# Patient Record
Sex: Male | Born: 1955 | Race: White | Hispanic: No | Marital: Single | Smoking: Former smoker
Health system: Southern US, Community
[De-identification: ages and names within clinical notes are randomized; demographics above are authoritative.]

## PROBLEM LIST (undated history)

## (undated) DIAGNOSIS — F209 Schizophrenia, unspecified: Secondary | ICD-10-CM

## (undated) DIAGNOSIS — E785 Hyperlipidemia, unspecified: Secondary | ICD-10-CM

## (undated) DIAGNOSIS — K219 Gastro-esophageal reflux disease without esophagitis: Secondary | ICD-10-CM

## (undated) DIAGNOSIS — E119 Type 2 diabetes mellitus without complications: Secondary | ICD-10-CM

## (undated) DIAGNOSIS — C801 Malignant (primary) neoplasm, unspecified: Secondary | ICD-10-CM

## (undated) DIAGNOSIS — I10 Essential (primary) hypertension: Secondary | ICD-10-CM

## (undated) HISTORY — PX: KNEE ARTHROSCOPY: SUR90

---

## 2001-12-01 ENCOUNTER — Encounter: Payer: Self-pay | Admitting: Emergency Medicine

## 2001-12-02 ENCOUNTER — Encounter: Payer: Self-pay | Admitting: Orthopedic Surgery

## 2001-12-02 ENCOUNTER — Inpatient Hospital Stay (HOSPITAL_COMMUNITY): Admission: EM | Admit: 2001-12-02 | Discharge: 2001-12-03 | Payer: Self-pay | Admitting: *Deleted

## 2001-12-02 ENCOUNTER — Encounter: Payer: Self-pay | Admitting: Emergency Medicine

## 2006-12-17 ENCOUNTER — Emergency Department (HOSPITAL_COMMUNITY): Admission: EM | Admit: 2006-12-17 | Discharge: 2006-12-17 | Payer: Self-pay | Admitting: Family Medicine

## 2007-01-30 ENCOUNTER — Emergency Department (HOSPITAL_COMMUNITY): Admission: EM | Admit: 2007-01-30 | Discharge: 2007-01-30 | Payer: Self-pay | Admitting: Family Medicine

## 2007-02-01 ENCOUNTER — Emergency Department (HOSPITAL_COMMUNITY): Admission: EM | Admit: 2007-02-01 | Discharge: 2007-02-01 | Payer: Self-pay | Admitting: Family Medicine

## 2007-03-14 ENCOUNTER — Emergency Department (HOSPITAL_COMMUNITY): Admission: EM | Admit: 2007-03-14 | Discharge: 2007-03-14 | Payer: Self-pay | Admitting: Emergency Medicine

## 2007-08-28 ENCOUNTER — Emergency Department (HOSPITAL_COMMUNITY): Admission: EM | Admit: 2007-08-28 | Discharge: 2007-08-28 | Payer: Self-pay | Admitting: Family Medicine

## 2008-12-24 ENCOUNTER — Inpatient Hospital Stay (HOSPITAL_COMMUNITY): Admission: EM | Admit: 2008-12-24 | Discharge: 2008-12-26 | Payer: Self-pay | Admitting: Emergency Medicine

## 2008-12-24 ENCOUNTER — Ambulatory Visit: Payer: Self-pay | Admitting: Internal Medicine

## 2008-12-28 ENCOUNTER — Ambulatory Visit: Payer: Self-pay

## 2011-02-13 LAB — CBC
HCT: 45.9 % (ref 39.0–52.0)
Hemoglobin: 15.9 g/dL (ref 13.0–17.0)
MCHC: 34.6 g/dL (ref 30.0–36.0)
MCV: 93.3 fL (ref 78.0–100.0)
Platelets: 172 10*3/uL (ref 150–400)
RBC: 4.92 MIL/uL (ref 4.22–5.81)
RDW: 13.3 % (ref 11.5–15.5)
WBC: 6.2 10*3/uL (ref 4.0–10.5)

## 2011-02-13 LAB — HEMOGLOBIN A1C: Hgb A1c MFr Bld: 6.4 % — ABNORMAL HIGH (ref 4.6–6.1)

## 2011-02-13 LAB — BASIC METABOLIC PANEL
BUN: 9 mg/dL (ref 6–23)
CO2: 25 mEq/L (ref 19–32)
Calcium: 9.5 mg/dL (ref 8.4–10.5)
Chloride: 108 mEq/L (ref 96–112)
Creatinine, Ser: 0.88 mg/dL (ref 0.4–1.5)
GFR calc Af Amer: >60 mL/min (ref >60)
GFR calc non Af Amer: >60 mL/min (ref >60)
Glucose, Bld: 139 mg/dL — ABNORMAL HIGH (ref 70–99)
Potassium: 4.4 mEq/L (ref 3.5–5.1)
Sodium: 139 mEq/L (ref 135–145)

## 2011-02-13 LAB — PROTIME-INR
INR: 1 (ref 0.00–1.49)
Prothrombin Time: 13.7 seconds (ref 11.6–15.2)

## 2011-02-13 LAB — DIFFERENTIAL
Basophils Absolute: 0 10*3/uL (ref 0.0–0.1)
Basophils Relative: 1 % (ref 0–1)
Eosinophils Absolute: 0.1 10*3/uL (ref 0.0–0.7)
Eosinophils Relative: 1 % (ref 0–5)
Lymphocytes Relative: 31 % (ref 12–46)
Lymphs Abs: 1.9 10*3/uL (ref 0.7–4.0)
Monocytes Absolute: 0.6 K/uL (ref 0.1–1.0)
Monocytes Relative: 9 % (ref 3–12)
Neutro Abs: 3.6 K/uL (ref 1.7–7.7)
Neutrophils Relative %: 58 % (ref 43–77)

## 2011-02-13 LAB — COMPREHENSIVE METABOLIC PANEL
ALT: 15 U/L (ref 0–53)
AST: 19 U/L (ref 0–37)
Albumin: 3.8 g/dL (ref 3.5–5.2)
Alkaline Phosphatase: 74 U/L (ref 39–117)
Calcium: 9.3 mg/dL (ref 8.4–10.5)
GFR calc Af Amer: >60 mL/min (ref >60)
Potassium: 4.5 mEq/L (ref 3.5–5.1)
Sodium: 140 mEq/L (ref 135–145)
Total Protein: 6.1 g/dL (ref 6.0–8.3)

## 2011-02-13 LAB — CARDIAC PANEL(CRET KIN+CKTOT+MB+TROPI)
Relative Index: INVALID (ref 0.0–2.5)
Relative Index: INVALID (ref 0.0–2.5)
Total CK: 42 U/L (ref 7–232)
Total CK: 45 U/L (ref 7–232)
Total CK: 59 U/L (ref 7–232)
Troponin I: <0.01 ng/mL (ref 0.00–0.06)

## 2011-02-13 LAB — POCT CARDIAC MARKERS
Myoglobin, poc: 63.7 ng/mL (ref 12–200)
Myoglobin, poc: 76.6 ng/mL (ref 12–200)
Troponin i, poc: <0.05 ng/mL (ref 0.00–0.09)

## 2011-02-13 LAB — APTT: aPTT: 28 s (ref 24–37)

## 2011-02-13 LAB — GLUCOSE, CAPILLARY
Glucose-Capillary: 116 mg/dL — ABNORMAL HIGH (ref 70–99)
Glucose-Capillary: 138 mg/dL — ABNORMAL HIGH (ref 70–99)

## 2011-02-13 LAB — LIPID PANEL
Cholesterol: 148 mg/dL (ref 0–200)
HDL: 27 mg/dL — ABNORMAL LOW (ref >39)
LDL Cholesterol: 93 mg/dL (ref 0–99)
Total CHOL/HDL Ratio: 5.5 RATIO
Triglycerides: 138 mg/dL (ref <150)
VLDL: 28 mg/dL (ref 0–40)

## 2011-02-13 LAB — RAPID URINE DRUG SCREEN, HOSP PERFORMED
Barbiturates: NOT DETECTED
Opiates: NOT DETECTED

## 2011-03-13 NOTE — H&P (Signed)
Chad Wang, Chad Wang             ACCOUNT NO.:  [id number]   MEDICAL RECORD NO.:  [id number]          PATIENT TYPE:  EMS   LOCATION:  MAJO                         FACILITY:  [facility]   PHYSICIAN:  Eduard Clos, MDDATE OF BIRTH:  1956/03/01   DATE OF ADMISSION:  12/24/2008  DATE OF DISCHARGE:                              HISTORY & PHYSICAL   HISTORY OF PRESENT ILLNESS:  Chad Wang is a 55 year old male with  past medical history of hypertension not on medications, questionable  history of diabetes, dyslipidemia and schizophrenia; who came to  emergency department complaining of chest pain and dyspnea on exertion  for the last week prior to being admitted.  The patient reports  especially when he is exercising, he develops chest tightness and  discomfort for 5-7/10 in intensity, localized in the middle of his chest  lasting about 5-10 minutes on and off associated with shortness of  breath and relieved by rest.  The patient endorses having those symptoms  at rest as well, but less frequent.  He reports that one week ago his  psychiatry medications for schizophrenia were changed decreasing  clonazepam dose and adding haloperidol at bedtime.  The patient denies  diaphoresis, abdominal pain, nausea, vomiting, fever, lightheadedness or  syncopal symptoms.   ALLERGIES:  No known drug allergies.   PAST MEDICAL HISTORY:  1. Schizophrenia.  2. Hypertension.  3. Dyslipidemia.  4. Questionable diabetes.   MEDICATIONS:  1. Tylenol 650 mg at bedtime.  2. Ibuprofen 800 mg p.r.n.  3. Clozaril 200 mg at bedtime.  4. Haldol 10 mg at bedtime.  5. Gemfibrozil 600 mg b.i.d.   REVIEW OF SYSTEMS:  Completely negative 12 point inspection.  The  patient even denies chest pain at the moment of interview.   SOCIAL HISTORY:  The patient denies use of illicit drugs.  He is a  current smoker one pack per day and reports occasional alcohol.  The  patient is vegetarian, is retired.   PRIMARY  CARE PHYSICIAN:  Dr. Nathanial Rancher in [place].   PHYSICAL EXAM:  Temperature 97.4, heart rate 92, blood pressure 145/84,  oxygen saturation 95% on room air, respiratory rate 20.  IN GENERAL:  The patient was in no acute distress, lying on bed.  CARDIOVASCULAR:  Mild tachycardia.  No murmurs, gallops or rubs.  PULMONARY:  Good air movement with scattered rhonchi.  No wheezing, no  crackles.  ABDOMEN:  Soft, nontender, nondistended.  Positive bowel sounds.  There  was no guarding or masses.  EXTREMITIES:  Without edema, cyanosis or clubbing.  NEUROLOGIC:  Cranial nerves II-XII intact.  Muscle strength 5/5  bilaterally.  No focal deficits.  The patient was alert, awake and  oriented x3.   LABORATORIES:  We have white blood cells of 6.2, hemoglobin 15.9,  platelets 172.  We have sodium 139, potassium 4.4, chloride 108, bicarb  25, BUN 9, creatinine 0.88, blood sugar 139.  Cardiac markers point of  care:  CK-MB 1.2, troponin less than 0.05.  Myoglobin 76.6.  The patient  had a chest x-ray that demonstrated stable chronic bronchitis changes  without infiltrates or  active disease.  EKG:  Left ventricular  hypertrophy, left atrial enlargement, T-wave inversions.  On anterior  and lateral views, no ST-segment depression or elevation.   ASSESSMENT AND PLAN:  1. Chest pain.  Multiple risk factors including diabetes, hypertension      and dyslipidemia, smoking and age.  The patient is going to be      admitted in order to rule out acute coronary syndrome.  We are      going to perform serial electrocardiograms, cardiac markers x3      every 8 hours.  We are going to check TSH, fasting lipid profile.      We are going to start the patient on beta blockers after ruling out      cocaine use by urine drug screen.  We are going to give morphine,      nitroglycerin p.r.n., aspirin and oxygen as needed.  The patient is      going to be admitted to telemetry under [facility] team D and      depending on  results of his laboratories and EKG, we are going to      consult Cardiology for inpatient versus outpatient stress test.  We      are going to use also Protonix and Lovenox for prophylaxis.  We are      going to check CMET and complete blood count, and we are going to      start the patient on statins.   1. Hypertension.  The patient is going to receive beta blockers and we      are going to start also hydrochlorothiazide.  We are going to check      renal function and electrolytes, and at the moment of discharge we      are going to decide if ACE inhibitors are needed for better      control, especially if patient is a diabetic.   1. Hyperlipidemia.  The patient is going to be started on statins and      we are going to check a lipid profile.   1. Tobacco abuse.  We are going to provide counseling and we are going      to use Nicotine patch at this moment.   1. Schizophrenia.  We are going to continue treatment with clonazepam      since the symptoms started after the addition of the Haldol, which      could produce arrhythmia and also hypertension.  We are going to      hold it for now.  The patient is stable regarding this problem.  He      denies hallucinations and suicide ideation at this time.  We are      going to consult Psychiatry if needed.   1. Questionable diabetes.  We are going to check a fasting blood sugar      and also A1c and depending on results, we are going to start      treatment if needed.   For prophylaxis we are going to use Protonix and Lovenox.      Rosanna Randy, MD  Electronically Signed      Eduard Clos, MD  Electronically Signed    CEM/MEDQ  D:  12/24/2008  T:  12/24/2008  Job:  [id number]

## 2011-03-13 NOTE — Consult Note (Signed)
Chad Wang, Chad Wang             ACCOUNT NO.:  [id number]   MEDICAL RECORD NO.:  [id number]          PATIENT TYPE:  INP   LOCATION:  4729                         FACILITY:  [facility]   PHYSICIAN:  Duke Salvia, MD, FACCDATE OF BIRTH:  1955-12-27   DATE OF CONSULTATION:  12/25/2008  DATE OF DISCHARGE:                                 CONSULTATION   We are asked to see Mr. Chahal at the request of the [facility]  because of chest pain.   Mr. Judice is a 55 year old generally with a past medical history  of untreated hypertension, dyslipidemia, family history of heart  disease, and treated schizophrenia, who comes to the emergency room with  complaints of chest pain and shortness of breath.  The history listed  below with a history I got it is different from the chart's history and  it is different from the patient's initial symptoms.   He describes last Thursday while running an uncomfortable sensation in  his midchest.  It made hard for him to catch his breath.  He describes  it as lasting just a couple of minutes.   In distinction, the admission note describes chest  discomfort occurring  multiple times during the preceding week while jogging, lasting 5-10  minutes and then the initial note also describes him as denying chest  pain.  Denies having had chest pain at all.  Having said that when I  first asked him about chest pain, he says it was not early pain, it was  pressure and it went into his left shoulder and into the jaw and then he  talks about this vacuous sensation in his midchest.   His cardiac risk factors are as noted previously.  He does not have a  history of reflux or ulcer disease, but he does take NSAIDS.   His past medical history is as noted previously.   His past surgical history is notable for knee surgery and ankle surgery.   His review of systems is broadly negative except as otherwise noted  above across multiple-organ systems.   He has  no known drug allergies.   His medications at home included Haldol, Klonopin, ibuprofen, and  gemfibrozil.   SOCIAL HISTORY:  He describes his diet has been vegetarian for 8-10  years.  He smokes a pack per day.  He denies use of illicit drugs.  He  drinks alcohol rarely and he lives in a home.   PHYSICAL EXAMINATION:  GENERAL:  He is a middle-aged Caucasian male,  appearing somewhat older than his stated age of 55.  VITAL SIGNS:  His blood pressure is 128/87.  His pulse is 89.  His  respirations are 20.  He is afebrile.  HEENT:  No icterus.  No xanthomata.  Although, he is wearing sunglasses  and a ski cap in the room.  NECK:  His neck veins are flat.  His carotids are brisk and full  bilaterally without bruits.  BACK:  Without kyphosis or scoliosis.  LUNGS:  Clear.  HEART:  Heart sounds are regular without murmurs or gallops.  ABDOMEN:  Soft with  active bowel sounds.  EXTREMITIES:  His femoral pulses are not examined.  His distal pulses  are intact.  There is no clubbing, cyanosis, or edema.  He is wearing a  knee brace on the left knee and a wrist brace on the left wrist.  He did  take off his glasses halfway through our examination.   His cardiac enzymes are negative.  His hemogram and basic chemistries  were normal.   His electrocardiogram dated today which is only when that we have  available demonstrates sinus rhythm at 90 with interval of  0.13/0.09/0.39.  There is evidence of left ventricular hypertrophy by  voltage criteria with ST-T wave changes in the anterior and lateral  precordium as well as T-wave flattening in the inferior leads.   IMPRESSION:  1. Chest pain with typical and atypical features.  2. Cardiac risk factors notable for:      a.     Hypertension.      b.     Dyslipidemia.      c.     Elevated hemoglobin A1c.      d.     Family history.  3. Schizophrenia.  4. Cigarette use.  5. Abnormal electrocardiogram consistent with left ventricular       hypertrophy and repolarization abnormalities.   DISCUSSION:  Mr. Vanderwerf has a modest pretest probability of  coronary artery disease based on history.  This is best as I can glean.  The absence of objective markers, i.e., enzymes or ischemic  electrocardiographic changes and giving his schizophrenia, I would be  inclined to pursue a noninvasive workup initially as it does not require  any systemic sedation.  Catheterization would be reserved in the event  of an abnormal scan.   I reviewed this with him and as best I can tell he understands and  agrees.   RECOMMENDATIONS:  Based on the above, we will therefore proceed with:  1. Nuclear stress imaging which can be done as an outpatient and the      patient can potentially be discharged tomorrow.  2. Beta-blockers with hydrochlorothiazide seemed reasonable choices      for his blood pressure at this point.  3. Cigarette cessation counseling.  4. Secondary risk factor modification for his hyperglycemia and      elevated hemoglobin A1c.   Thank you for the consultation.      Duke Salvia, MD, [facility]  Electronically Signed     SCK/MEDQ  D:  12/25/2008  T:  12/26/2008  Job:  [id number]   cc:   Burnell Blanks, MD

## 2011-03-13 NOTE — Discharge Summary (Signed)
Wang, Chad             ACCOUNT NO.:  [id number]   MEDICAL RECORD NO.:  [id number]          PATIENT TYPE:  INP   LOCATION:  4729                         [facility]ACILITY[facility]  [facility]   PHYSICIAN:  Charlestine Massed, MDDATE OF BIRTH:  03/24/1956   DATE OF ADMISSION:  12/24/2008  DATE OF DISCHARGE:  12/26/2008                               DISCHARGE SUMMARY   PRIMARY CARE DOCTOR:  Burnell Blanks, MD   The patient was a resident of [facility] in [place].   REASON FOR ADMISSION:  Chest pain and dyspnea on exertion for 1 week.   DISCHARGE DIAGNOSES:  1. Atypical chest pain, acute myocardial infarction ruled out.  2. Hypertension.  3. Dyslipidemia.  4. Family history of heart disease.  5. Schizophrenia.  6. Tobacco abuse.  7. Questionable diabetes.   DISCHARGE MEDICATIONS:  1. Aspirin 81 mg daily (new medication).  2. Lopressor 12.5 mg b.i.d.  3. Hydrochlorothiazide 25 mg daily.  4. Pravachol 40 mg p.o. daily.  5. Nicotine patch 21 mg per day for 7 days, then 40 mg per day for 7      days and 7 mg per day for 7 days and then stop.   Existing medications to be continued:  1. Gemfibrozil 610 mg b.i.d.  2. Clozaril 200 mg q.h.s.  3. Haldol 10 mg p.o. q.h.s.   HOSPITAL COURSE BY PROBLEM:  1. Chest pain.  Mr. Tanay Misuraca is a 55 year old Caucasian male      who lives at this group home because of his history of      schizophrenia, brought in with complaints of chest pain.  He states      that he usually jogs a lot and for the past 1 week whenever he is      starting jogging, after a particular distance, he gets a pressure-      like feeling in his chest.  It does not radiate and at the same      time, he also gets slightly short of breath.  He will stop and then      the pain lasts a few minutes and goes away.  He never had any loss      of consciousness.  He did not fall.  There was no nausea, vomiting      or diarrhea.  The patient was admitted with the diagnosis  of      atypical chest pain to rule out myocardial infarction.  EKG did not      show any acute changes did not show any acute changes other than      the existing change of left ventricular hypertrophy with LV strain      pattern secondary to LVH.  His serum cardiac markers were negative      for an acute MI.  The patient has been pain free for 24 after      admission the hospital.  The patient was seen by cardiology,      [facility], by Dr. Graciela Husbands.  He has opined that the patient      has atypical chest pain and  so will need a stress test with nuclear      imaging which the patient is supposed to get as an outpatient.  The      patient was observed overnight.  Telemetry did not have any issues      and he has been chest pain-free.  His outpatient stress test as he      has been set up by [facility] for follow-up as outpatient on Monday      for stress testing, so the patient is being discharged.  2. Hypertension.  The patient had been started on hydrochlorothiazide      and low dose Lopressor.  He has been advised to continue the      medications and to adhere to medications.  3. Hypercholesterolemia.  The patient was on gemfibrozil 600 mg b.i.d.      before meals.  We have added Pravachol 40 mg p.o. daily in view of      his existing medications of hydrochlorothiazide and Clozaril.  The      patient is to continue the medication and low sodium heart healthy      diet.  4. Questionable diabetes.  His hemoglobin A1c is slightly elevated at      6.4.  As per the guidelines, that is not a diagnostic criteria for      diabetes by any means.  The patient needs to be worked up with 2      sets of fasting blood sugars while at the group home where his      primary care doctor can follow him up and if he is found diabetic,      can be initiated on medications.  A single reading of 6.4 of      hemoglobin A1c in no way is diagnostic factor for diabetes mellitus      so I am not starting any  medications for diabetes at this point.  5. Tobacco dependence.  The patient has agree to continue on nicotine      patch and stop his smoking.  He has been educated clearly about the      risks of smoking as he has chest pain, has hypercholesterolemia      with hypertension and a strong family history of coronary artery      disease.  The patient agrees and he has been prescribed nicotine      patch at discharge to continue.  The patient is being discharged      today.   DISCHARGE INSTRUCTIONS:  1. Follow up with [facility] for outpatient stress testing.  2. Adhere to medications.  3. Cessation of smoking.   FOLLOW UP:  1. follow up with [facility] for stress testing as an outpatient at the      mentioned date  2. Primary care physician to follow-up the patient's blood sugars at      nursing home, 2 sets of fasting blood sugars to diagnose if the      patient has diabetes are not.      Charlestine Massed, MD  Electronically Signed     UT/MEDQ  D:  12/26/2008  T:  12/26/2008  Job:  [id number]   cc:   Burnell Blanks, MD  Duke Salvia, MD, [facility]

## 2011-03-16 NOTE — Discharge Summary (Signed)
[place]. [facility]  Patient:    [name], [name] Visit Number: [id number] MRN: [id number]          Service Type: MED Location: 5000 [id number] Attending Physician:  Dominica Severin Dictated by:   Elisha Ponder, M.D. Admit Date:  12/01/2001 Discharge Date: 12/03/2001                             Discharge Summary  DISCHARGE SUMMARY:  Mr. Chad Wang was admitted to my service on December 02, 2001 secondary to an open fifth metacarpal fracture left hand. He was noted to have fourth and fifth metacarpal fractures about the left hand requiring I&D and ORIF.  The patient also underwent an irrigation and debridement and repair of an extensor tendon laceration about the left hand.  The patient tolerated the operation well. He was taken emergently to the operative suite and did undergo antibiotic prophylaxis preoperatively and postoperatively.  Following surgery, the patient had a smooth postoperative course.  His pain was managed well.  He was able to tolerate a diet and void without problems the next day.  He was given full course of antibiotics due to the open nature of the fracture.  The patient was stable postoperative day one and observed. He had stable hematocrit, normal refill to the fingers and normal sensation as well as motor function without signs of compartment syndrome or other postoperative sequelae. On postoperative day two, the patient was doing quite well.  His neurovascular examination remained excellent.  He was tolerating his diet and anxious to go home.  He was discharged home on appropriate pain medicines and will continue his current home medicines.  The patient received a full course of antibiotics in the hospital and was not discharged on any antibiotics at the conclusion of his hospital stay.  DISCHARGE MEDICATIONS: Percocet and Robaxin were given.  SPECIAL INSTRUCTIONS:  He was instructed on elevation, finger range of  motion within the confines of his splint and no smoking.  I asked the patient to return to my office in seven days for follow up at which time we will perform a wound check and proceed accordingly. He was stable, awake, alert and oriented at th time of discharge and doing excellently.  He had a smooth postoperative course.  All questions were encouraged and answered upon his discharge.  Look forward to participating in his postoperative care.  If he has any problems or questions prior to his seven day postoperative visit he will let us know. Dictated by:   Elisha Ponder, M.D. Attending Physician:  Dominica Severin DD:  12/25/01 TD:  12/25/01 Job: [id number]

## 2011-03-16 NOTE — Discharge Summary (Signed)
[place]. [facility]  Patient:    Chad Wang, Chad Wang Visit Number: [id number] MRN: [id number]          Service Type: MED Location: 5000 5012 [id number] Attending Physician:  Dominica Severin Dictated by:   Dorie Rank, P.A. Admit Date:  12/01/2001 Disc. Date: 12/03/01                             Discharge Summary  ADMISSION DIAGNOSES: 1. Open fourth and fifth metacarpal fractures on the left. 2. History of schizophrenia.  DISCHARGE DIAGNOSES: 1. Open fourth and fifth metacarpal fractures on the left. 2. History of schizophrenia.  PROCEDURES:  On December 02, 2001, the patient underwent I&D of the left fifth metacarpal fracture, open in nature.  Open reduction and internal fixation of the left fifth metacarpal fracture with intramedullary fixation.  ORIF of left fourth metacarpal fracture with intramedullary fixation, extensor tendon repair of fifth finger, left hand at the metacarpophalangeal region.  The surgeon was Dr. Onalee Hua.  Assistant was [employer].  Complications: None.  CONSULTATIONS:  None.  BRIEF HISTORY:  This is a pleasant 55 year old male who presented to the hospital with open fourth and fifth metacarpal fractures.  It was felt he would benefit from and need to undergo surgical intervention with I&D.  Risks and benefits of all procedures were discussed with the patient, and he elected to proceed.  HOSPITAL COURSE:  The patient was admitted on the above-stated date and underwent the above-stated procedures.  He tolerated this well without complication.  Appropriate IV antibiotics were used postoperatively for infection control for his stay in the hospital.  He was given IV morphine initially for pain control; however, he was weaned off of this and was utilizing p.o. Percocet for pain control.   A regular diet was resumed.  He tolerated p.o. intake well without nausea or vomiting.  He did have flatus. He utilized mission sling while  in hospital for elevation; however, he did not like to stay in this, and his arm was kept elevated on pillows throughout his hospital stay.  He was advised on ______ in the hospital.  His home medication of Clozaril was resumed postoperatively.  Neurovascular and motor checks were made repeated and found to be intact.  Tissues were soft. Hemoglobin was stable on postop day #1 at 18.0.  Social worker aided him in arranging his discharge back to [facility].  On December 03, 2001, he was felt to be orthopedically and medically stable for discharge.  LABORATORY DATA:  CBC on December 02, 2001, showed a white count of 11.6, otherwise within normal limits.  BMET on December 02, 2001, showed a pH of 7.427, bicarb 26.0.  Hemoglobin 18.0, hematocrit 53.0, otherwise within normal limits.  Left hand x-rays from December 01, 2001, revealed fourth and fifth metacarpal fractures.  Two-view chest x-ray on December 02, 2001, revealed no cardiopulmonary findings.  Portable fluoroscopy intraoperative on December 02, 2001, revealed closed reduction and internal fixation of fourth and fifth metacarpal fractures.  Cardiology tracings from December 02, 2001, revealed normal sinus rhythm, left ventricular hypertrophy, right ventricular conduction delay.  No tracings are available to compare.  Confirmed by Madolyn Frieze. Jens Som, M.D. [facility]  CONDITION UPON DISCHARGE:  Improved.  DISPOSITION:  The patient will be discharged to [facility] where he was prior to hospital stay.  DISCHARGE MEDICATIONS: 1. Resume preoperative dose of Clozaril. 2. He was provided with prescription for Robaxin  500 mg 1 p.o. q.8h.    p.r.n. muscle spasm. 3. Percocet 5 mg, #21, p.o. q.4-6h. p.r.n. pain.  WOUND CARE:  He is to keep his dressings clean and dry.  He may shower; however, he needs to wrap his arm in a plastic bag.  If he is unable to do this, he can sponge bathe.  DIET:  Regular.  FOLLOWUP:  Physician assistant at  [facility] in one week. He is to follow up with Dr. Amanda Pea in two weeks.  Call [phone] to arrange these appointments.  SPECIAL INSTRUCTIONS:  He is to keep his arm elevated for edema control.  As well, he may move his fingers around to reduce the edema.  No smoking. Dictated by:   Dorie Rank, P.A. Attending Physician:  Dominica Severin DD:  12/03/01 TD:  12/03/01 Job: [id number]

## 2011-03-16 NOTE — Op Note (Signed)
[place]  Patient:    Chad Wang, Chad Wang Visit Number: [id number] MRN: [id number]          Service Type: MED Location: [id number] Attending Physician:  Dominica Severin Dictated by:   Elisha Ponder, M.D. Proc. Date: 12/02/01 Admit Date:  12/01/2001                             Operative Report  PREOPERATIVE DIAGNOSES: 1. Open fifth metacarpal fracture, left hand. 2. Displaced left ring finger metacarpal fracture, left hand.  POSTOPERATIVE DIAGNOSES: 1. Open fifth metacarpal fracture, left hand. 2. Displaced left ring finger metacarpal fracture, left hand. 3. Extensor digitorum communis laceration about the fifth finger.  PROCEDURE: 1. Irrigation and debridement, left fifth metacarpal fracture, open in    nature, including excisional debridement of skin, subcutaneous tissue, and    bone. 2. Open reduction and internal fixation, left fifth metacarpal fracture, with    intramedullary fixation. 3. Open reduction and internal fixation, left fourth metacarpal fracture, with    intramedullary fixation. 4. Extensive tendon repair, fifth finger, left hand, at the    metacarpophalangeal region.  SURGEON:  Elisha Ponder, M.D.  ASSISTANTJill Side P. Mahar, P.A.-C.  ANESTHESIA:  General.  COMPLICATIONS:  None.  ANESTHESIA:  General.  TOURNIQUET TIME:  Less than an hour.  ESTIMATED BLOOD LOSS:  Minimal.  DRAINS:  None.  INDICATION FOR PROCEDURE:  The patient is a pleasant 55 year old male who presents with the above-mentioned diagnosis.  I have counseled him in regard to the risks and benefits of surgery, including risks of infection, bleeding, anesthesia, damage to normal structures, and failure of surgery to accomplish the intended goals of relieving symptoms and restoring function.  _____ the procedure.  All questions have been encouraged and answered preoperatively.  The patient was given Ancef in the emergency room.   Tetanus status has been addressed.  The patient, unfortunately, has an open fracture.  We will take him to the operating room emergently for I&D and repair of structures as necessary.  OPERATIVE FINDINGS:  The patient had open fifth metacarpal fracture, severely comminuted, with associated extensor tendon lag in the region.  This is a 60% tear.  He underwent extensor tendon repair, I&D, and repair of the fifth metacarpal fracture.  The fourth metacarpal fracture was also widely displaced and was fixed with ORIF as well.  He had good, stable fixation and excellent blood flow to the fingers at the conclusion of this case.  DESCRIPTION OF PROCEDURE:  The patient was seen by myself and anesthesia.  He was taken to the operating suite and underwent a smooth induction of general anesthetic under the direction of J. Claybon Jabs, M.D.  Following this, the patient was appropriately padded and prepped and draped in the usual sterile fashion about the left upper extremity.  Appropriate padding was applied. Once the sterile field was secured, the patient had an incision made along an open entry site of his fifth metacarpal.  Dissection was carried down and extension of the wound was accomplished.  I&D of skin and subcutaneous tissue was accomplished.  The patient had skin edges trimmed.  Following this, extensor tendon was debrided.  It was noted to be partially torn, greater than 60% tear.  The patient then had access to the fracture site.  Both fracture sites were identified, exposed, curetted, and noted to be highly comminuted. Copious irrigation was applied to  this region.  Following this, the patient had fixation accomplished about the fourth and fifth metacarpal fractures. This was done with a transverse incision at the [facility] joint regions.  A pilot hole was made with the drill bit.  Following this, a 0.062 K-wire, blunt-tipped in nature and curved at the end, was placed down the  medullary shafts/canals of the metacarpals.  Reduction was accomplished.  Excellent stability was obtained.  There were no complications.  X-rays were taken, AP, lateral, and oblique, which showed excellent reduction, good stability, _____ of the fingers, and alignment of the scaphoid tuberosity.  I was very pleased and dropped the tourniquet at this point in time and achieved hemostasis and repaired the extensor tendon laceration with Ethibond suture.  Following this, the wounds were closed with interrupted Prolene suture of the 4-0 variety without difficulty.  The compartments were soft.  There were no complications or excessive bleeding.  The open puncture site was left open to allow for egress of fluid.  The patient tolerated this well, and there were no complications.  Following this, sterile dressings applied and a splint was then placed, volar in nature, with the wrist in extension and the MCP joints flexed.  The patient tolerated this well.  There were no immediate orthopedic complications.  He will be admitted for IV pain management and antibiotics, observation, and general precautions.  He will return to see [place] in one week in the office after he is discharged if things are going well.  All questions have been encouraged and answered. Dictated by:   Elisha Ponder, M.D. Attending Physician:  Dominica Severin DD:  12/02/01 TD:  12/02/01 Job: [id number]

## 2012-12-13 ENCOUNTER — Emergency Department (HOSPITAL_COMMUNITY): Payer: Medicare Other

## 2012-12-13 ENCOUNTER — Encounter (HOSPITAL_COMMUNITY): Payer: Self-pay | Admitting: *Deleted

## 2012-12-13 ENCOUNTER — Emergency Department (HOSPITAL_COMMUNITY)
Admission: EM | Admit: 2012-12-13 | Discharge: 2012-12-15 | Disposition: A | Discharge status: Other Acute Inpatient Hospital | Payer: Medicare Other | Attending: Emergency Medicine | Admitting: Emergency Medicine

## 2012-12-13 DIAGNOSIS — N390 Urinary tract infection, site not specified: Secondary | ICD-10-CM

## 2012-12-13 DIAGNOSIS — F17200 Nicotine dependence, unspecified, uncomplicated: Secondary | ICD-10-CM | POA: Insufficient documentation

## 2012-12-13 DIAGNOSIS — F209 Schizophrenia, unspecified: Secondary | ICD-10-CM | POA: Insufficient documentation

## 2012-12-13 DIAGNOSIS — R4182 Altered mental status, unspecified: Secondary | ICD-10-CM

## 2012-12-13 HISTORY — DX: Hyperlipidemia, unspecified: E78.5

## 2012-12-13 HISTORY — DX: Schizophrenia, unspecified: F20.9

## 2012-12-13 HISTORY — DX: Gastro-esophageal reflux disease without esophagitis: K21.9

## 2012-12-13 HISTORY — DX: Essential (primary) hypertension: I10

## 2012-12-13 LAB — URINALYSIS, ROUTINE W REFLEX MICROSCOPIC
Ketones, ur: 15 mg/dL — AB
Nitrite: NEGATIVE
Specific Gravity, Urine: 1.03 (ref 1.005–1.030)
Urobilinogen, UA: 0.2 mg/dL (ref 0.0–1.0)

## 2012-12-13 LAB — CBC WITH DIFFERENTIAL/PLATELET
Basophils Absolute: 0 10*3/uL (ref 0.0–0.1)
Basophils Relative: 0 % (ref 0–1)
Hemoglobin: 14.6 g/dL (ref 13.0–17.0)
Lymphocytes Relative: 11 % — ABNORMAL LOW (ref 12–46)
MCHC: 36.3 g/dL — ABNORMAL HIGH (ref 30.0–36.0)
Monocytes Relative: 7 % (ref 3–12)
Neutro Abs: 10.4 10*3/uL — ABNORMAL HIGH (ref 1.7–7.7)
Neutrophils Relative %: 81 % — ABNORMAL HIGH (ref 43–77)
RDW: 13.5 % (ref 11.5–15.5)
WBC: 12.9 10*3/uL — ABNORMAL HIGH (ref 4.0–10.5)

## 2012-12-13 LAB — RAPID URINE DRUG SCREEN, HOSP PERFORMED
Barbiturates: NOT DETECTED
Tetrahydrocannabinol: NOT DETECTED

## 2012-12-13 LAB — COMPREHENSIVE METABOLIC PANEL
AST: 17 U/L (ref 0–37)
Albumin: 4.5 g/dL (ref 3.5–5.2)
Alkaline Phosphatase: 87 U/L (ref 39–117)
Chloride: 102 mEq/L (ref 96–112)
Potassium: 3.8 mEq/L (ref 3.5–5.1)
Total Bilirubin: 0.4 mg/dL (ref 0.3–1.2)

## 2012-12-13 LAB — ETHANOL: Alcohol, Ethyl (B): <11 mg/dL (ref 0–11)

## 2012-12-13 LAB — URINE MICROSCOPIC-ADD ON

## 2012-12-13 MED ORDER — ONDANSETRON HCL 8 MG PO TABS: 4.0000 mg | ORAL_TABLET | Freq: Three times a day (TID) | ORAL | Status: DC | PRN

## 2012-12-13 MED ORDER — ACETAMINOPHEN 325 MG PO TABS
650.0000 mg | ORAL_TABLET | ORAL | Status: DC | PRN
  Administered 2012-12-14: 650 mg via ORAL
  Filled 2012-12-13: qty 2

## 2012-12-13 MED ORDER — LORAZEPAM 2 MG/ML IJ SOLN
1.0000 mg | Freq: Once | INTRAMUSCULAR | Status: AC
  Administered 2012-12-13: 1 mg via INTRAVENOUS
  Filled 2012-12-13: qty 1

## 2012-12-13 MED ORDER — IBUPROFEN 200 MG PO TABS: 600.0000 mg | ORAL_TABLET | Freq: Three times a day (TID) | ORAL | Status: DC | PRN

## 2012-12-13 MED ORDER — ALUM & MAG HYDROXIDE-SIMETH 200-200-20 MG/5ML PO SUSP: 30.0000 mL | ORAL | Status: DC | PRN

## 2012-12-13 MED ORDER — SODIUM CHLORIDE 0.9 % IV BOLUS (SEPSIS)
500.0000 mL | Freq: Once | INTRAVENOUS | Status: AC
  Administered 2012-12-13: 500 mL via INTRAVENOUS

## 2012-12-13 MED ORDER — LORAZEPAM 1 MG PO TABS
1.0000 mg | ORAL_TABLET | Freq: Three times a day (TID) | ORAL | Status: DC | PRN
  Administered 2012-12-15: 1 mg via ORAL
  Filled 2012-12-13 (×2): qty 1
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
## 2012-12-13 NOTE — ED Notes (Signed)
Pt. At desk asking for more coffee states he has a cramping in his side that coffee could fix. Explained that he had already his agreed upon 2 cups of coffee (decaf) and if he would like something else i would get it for him. He agreed to a cup of ice water and went back to his room. He asked when he could have more coffee and he was told in the am.

## 2012-12-13 NOTE — ED Notes (Signed)
Patient's brother Ronaldo Miyamoto) [phone]
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
## 2012-12-13 NOTE — ED Notes (Signed)
Patient has been placed in blue scrubs and wanded

## 2012-12-13 NOTE — ED Notes (Signed)
Returned from ct scan 

## 2012-12-14 MED ORDER — OLANZAPINE 5 MG PO TABS
10.0000 mg | ORAL_TABLET | Freq: Two times a day (BID) | ORAL | Status: DC
  Administered 2012-12-14 – 2012-12-15 (×2): 10 mg via ORAL
  Filled 2012-12-14 (×3): qty 2

## 2012-12-14 NOTE — ED Provider Notes (Signed)
Psychiatry consultation is appreciated. I have spoken directly with Dr. Henderson Cloud who is inpatient psychiatric care and starting patient on Zyprexa 10 mg twice a day.  Dione Booze, MD 12/14/12 [phone]

## 2012-12-14 NOTE — ED Notes (Signed)
ACT team member in the room

## 2012-12-14 NOTE — BH Assessment (Signed)
[facility] Assessment Progress Note    Attempted to see patient at 00:49 but he was too sleepy to assess.  Will try again after 05:00.

## 2012-12-14 NOTE — BHH Counselor (Signed)
Writer received call from [facility] [phone] & [phone] (Fax), requesting more information on pt. She indicated that the [facility] assessment was not enough and an evaluation from a MD was needed in order for her to present for possible admission. Sherrie George, RN (pod C) is completing the Tele-psych request form for Dr. Lorenso Courier to sign. Rosey Bath also requested a list of current medication and any notable pt activity while in the ED. Ranae Pila, LCAS

## 2012-12-14 NOTE — BHH Counselor (Signed)
Chad Wang, assessment counselor at [facility], submitted Pt for admission to [facility]. Shalita Forrest, [facility] confirmed there is not currently a bed available. Shuvon Rankin, FNP reviewed clinical information and accepted Pt to the service of Dr. Cyndia Diver when a 400 hall bed becomes available. She also requests the ED MD "review for infection and treat if necessary." Notified Ranae Pila, ACT counselor, of disposition.  Harlin Rain Patsy Baltimore, LPC, [facility] Assessment Counselor

## 2012-12-14 NOTE — ED Notes (Signed)
Pt. Requesting 3 cups of coffee with sugar and cream and buttered toast for breakfast.

## 2012-12-14 NOTE — ED Provider Notes (Signed)
0830.  Pt is in bed sleep, NAD.  Note stable VS.  Nursing has reported some inappropriate behaviors including flashing his genitalia at staff members.  He has been evaluated by the ACT Team provider and he is currently working towards finding an appropriate placement.  Will continue to follow.  Tobin Chad, MD 12/14/12 [phone]

## 2012-12-14 NOTE — ED Notes (Signed)
Gave Sitter a lunch break
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
## 2012-12-14 NOTE — ED Notes (Signed)
Pt out of room to nurses station asking for a razor to shave face.   Pt informed we do not have a razor he can use.  Pt upset.  Pt pulling items from drawers in room.  To restroom, trying to shave with the package of a povidone-iodine stick. Pt has very small lacerations on face.  No bleeding. All cabinets and drawers zip tied closed for pt safety.

## 2012-12-15 ENCOUNTER — Inpatient Hospital Stay (HOSPITAL_COMMUNITY): Admission: AD | Admit: 2012-12-15 | Payer: Medicare Other | Source: Intra-hospital | Admitting: Psychiatry

## 2012-12-15 MED ORDER — SULFAMETHOXAZOLE-TMP DS 800-160 MG PO TABS
1.0000 | ORAL_TABLET | Freq: Two times a day (BID) | ORAL | Status: DC
  Administered 2012-12-15: 1 via ORAL
  Filled 2012-12-15: qty 1

## 2012-12-15 NOTE — ED Notes (Signed)
[facility] HERE TO TRANSPORT PATIENT

## 2012-12-15 NOTE — BH Assessment (Signed)
Assessment Note  Update:  Called [facility] in reference to pt referral.  Pt accepted to Dr. Les Pou per Sam @ [phone] and pt's bed ready.  Called Sheriff's office, spoke with Corporal Penrod, who stated he would send a deputy to transport pt within the hour @ 0930.  Updated EDP Silverio Lay and ED staff.  Updated assessment disposition, completed assessment notification, and faxed to [facility] to log.  Pt to be discharged to [facility].    Disposition:  Disposition Disposition of Patient: Inpatient treatment program Type of inpatient treatment program: Adult Patient referred to: Other (Comment) (Pt accepted [facility])  On Site Evaluation by:   Reviewed with Physician:  Tacy Learn, Rennis Harding 12/15/2012 9:56 AM

## 2012-12-15 NOTE — ED Notes (Signed)
REPORT CALLED TO MRS. ROBINSON AT [facility]. [phone]

## 2012-12-15 NOTE — ED Provider Notes (Addendum)
Chad Wang is a 57 y.o. male hx of schizophrenia here with agitation and AMS. He is calm this AM, sleeping comfortably. No issues as per nursing. He is pending [facility] at [facility]. He also may have UTI on UA. Started on bactrim x 7 days.    Richardean Canal, MD 12/15/12 [phone]  Patient accepted under Dr. Les Pou. Stable for transfer.   Richardean Canal, MD 12/15/12 [phone]

## 2013-02-19 ENCOUNTER — Encounter (HOSPITAL_COMMUNITY): Payer: Self-pay | Admitting: *Deleted

## 2013-02-19 ENCOUNTER — Emergency Department (INDEPENDENT_AMBULATORY_CARE_PROVIDER_SITE_OTHER)
Admission: EM | Admit: 2013-02-19 | Discharge: 2013-02-19 | Disposition: A | Discharge status: Home / Self Care | Payer: Medicare Other | Source: Home / Self Care

## 2013-02-19 DIAGNOSIS — R079 Chest pain, unspecified: Secondary | ICD-10-CM | POA: Diagnosis present

## 2013-02-19 DIAGNOSIS — L02416 Cutaneous abscess of left lower limb: Secondary | ICD-10-CM

## 2013-02-19 DIAGNOSIS — L02419 Cutaneous abscess of limb, unspecified: Secondary | ICD-10-CM

## 2013-02-19 DIAGNOSIS — L03119 Cellulitis of unspecified part of limb: Secondary | ICD-10-CM

## 2013-02-19 MED ORDER — HYDROCODONE-ACETAMINOPHEN 5-325 MG PO TABS: 1.0000 | ORAL_TABLET | ORAL | Status: DC | PRN

## 2013-02-19 MED ORDER — ALBUTEROL SULFATE HFA 108 (90 BASE) MCG/ACT IN AERS: 2.0000 | INHALATION_SPRAY | RESPIRATORY_TRACT | Status: DC | PRN

## 2013-02-19 MED ORDER — PREDNISONE 20 MG PO TABS: ORAL_TABLET | ORAL | Status: DC

## 2013-02-19 MED ORDER — FLUTICASONE PROPIONATE 50 MCG/ACT NA SUSP: 2.0000 | Freq: Every day | NASAL | Status: DC

## 2013-02-19 MED ORDER — SULFAMETHOXAZOLE-TRIMETHOPRIM 800-160 MG PO TABS: 1.0000 | ORAL_TABLET | Freq: Two times a day (BID) | ORAL | Status: DC

## 2013-02-19 NOTE — ED Provider Notes (Signed)
History     CSN: [id number]  Arrival date & time 02/19/13  1259   First MD Initiated Contact with Patient 02/19/13 1300      Chief Complaint  Patient presents with  . Abscess    (Consider location/radiation/quality/duration/timing/severity/associated sxs/prior treatment) HPI Comments: 57 year old male complaining of a boil on his left calf approximately one week. Stated the nurse that his workplace told him it was infected he did come to the urgent care. Is complaining of mild pain to the abscess.   Past Medical History  Diagnosis Date  . Schizophrenia   . Hypertension   . Hyperlipemia   . GERD (gastroesophageal reflux disease)     Past Surgical History  Procedure Laterality Date  . Knee arthroscopy      No family history on file.  History  Substance Use Topics  . Smoking status: Current Every Day Smoker  . Smokeless tobacco: Not on file  . Alcohol Use: Yes      Review of Systems  Constitutional: Negative.   Skin: Negative for rash.       As per history of present illness  All other systems reviewed and are negative.    Allergies  Review of patient's allergies indicates no known allergies.  Home Medications   Current Outpatient Rx  Name  Route  Sig  Dispense  Refill  . amLODipine (NORVASC) 10 MG tablet   Oral   Take 10 mg by mouth daily.         Marland Kitchen aspirin 81 MG tablet   Oral   Take 81 mg by mouth daily.         . cloZAPine (CLOZARIL) 100 MG tablet   Oral   Take 200 mg by mouth at bedtime.          Marland Kitchen gemfibrozil (LOPID) 600 MG tablet   Oral   Take 600 mg by mouth 2 (two) times daily.         Marland Kitchen HYDROcodone-acetaminophen (NORCO/VICODIN) 5-325 MG per tablet   Oral   Take 1 tablet by mouth every 4 (four) hours as needed for pain.   12 tablet   0   . lisinopril (PRINIVIL,ZESTRIL) 40 MG tablet   Oral   Take 40 mg by mouth every morning.          . pantoprazole (PROTONIX) 40 MG tablet   Oral   Take 40 mg by mouth at bedtime.         . pravastatin (PRAVACHOL) 40 MG tablet   Oral   Take 40 mg by mouth at bedtime.         . sulfamethoxazole-trimethoprim (SEPTRA DS) 800-160 MG per tablet   Oral   Take 1 tablet by mouth 2 (two) times daily. X 7 days   14 tablet   0     BP 123/75  Pulse 103  Temp(Src) 98 F (36.7 C) (Oral)  Resp 16  SpO2 100%  Physical Exam  Nursing note and vitals reviewed. Constitutional: He is oriented to person, place, and time. He appears well-developed and well-nourished. No distress.  Neck: Normal range of motion. Neck supple.  Pulmonary/Chest: Effort normal.  Musculoskeletal: Normal range of motion. He exhibits no edema.  Neurological: He is alert and oriented to person, place, and time. He exhibits normal muscle tone.  Skin: Skin is warm and dry. No rash noted.  Left lateral lower leg with approximately a centimeter mounded area of erythema and induration. Positive fluctuance. No lymphangitis. Moderate erythema.  Psychiatric: He has a normal mood and affect.    ED Course  INCISION AND DRAINAGE Date/Time: 02/19/2013 2:45 PM Performed by: Phineas Real, Trejon Duford Authorized by: Leslee Home C Consent: Verbal consent obtained. Risks and benefits: risks, benefits and alternatives were discussed Consent given by: patient Patient identity confirmed: verbally with patient Type: abscess Body area: lower extremity Location details: left leg Anesthesia: local infiltration Local anesthetic: lidocaine 2% with epinephrine Anesthetic total: 7 ml Patient sedated: no Scalpel size: 11 Incision type: single with marsupialization Complexity: simple Drainage: purulent Drainage amount: copious Wound treatment: drain placed Packing material: 1/4 in gauze   (including critical care time)  Labs Reviewed  CULTURE, ROUTINE-ABSCESS  SEDIMENTATION RATE  CBC WITH DIFFERENTIAL   No results found.  1. Abscess of left leg       MDM  Incision and drainage Dressing applied Septra DS one twice  a day for 7 days Norco 5 mg one by mouth q. 4-6 hours when necessary pain #12 Keep the wound clean and dry. Return to urgent care in 2 days for wound check and packing removal. Culture obtained       Hayden Rasmussen, NP 02/19/13 1507

## 2013-02-19 NOTE — ED Notes (Signed)
I ACKNOWLEDGED THIS TEST BEFORE DAVID MABE  CX THE TEST

## 2013-02-19 NOTE — ED Notes (Signed)
Pt  Has  An inflamed       Area  To  l  Calf         Tender  To  Touch  Has   A  Yellow  Core      Has  Had  stapph  Infection in  Past

## 2013-02-19 NOTE — ED Provider Notes (Signed)
Medical screening examination/treatment/procedure(s) were performed by non-physician practitioner and as supervising physician I was immediately available for consultation/collaboration.  Kaiyla Stahly, M.D.  [place] Greb C Bates [place], MD 02/19/13 1956 

## 2013-02-21 ENCOUNTER — Encounter (HOSPITAL_COMMUNITY): Payer: Self-pay | Admitting: Emergency Medicine

## 2013-02-21 ENCOUNTER — Emergency Department (INDEPENDENT_AMBULATORY_CARE_PROVIDER_SITE_OTHER)
Admission: EM | Admit: 2013-02-21 | Discharge: 2013-02-21 | Disposition: A | Discharge status: Home / Self Care | Payer: Medicare Other | Source: Home / Self Care

## 2013-02-21 DIAGNOSIS — Z5189 Encounter for other specified aftercare: Secondary | ICD-10-CM

## 2013-02-21 NOTE — ED Provider Notes (Signed)
History     CSN: [id number]  Arrival date & time 02/21/13  1129   None     Chief Complaint  Patient presents with  . Suture / Staple Removal    pt is in for packing removal from the left calf. area is red and hot to touch, pus is coming from wound. wound appears to be infected.     (Consider location/radiation/quality/duration/timing/severity/associated sxs/prior treatment) HPI Comments: As above. This patient had an I&D: By packing 2 days ago. Presents today to have packing removed and wound check. The packing is intact. There remains erythema and induration around the wound however there is no further drainage after removing the packing. He filled his prescription yesterday and has only taken 2 doses. His understanding that he should be taking 1 tablet a day but the prescription is PID. He states his leg is actually feeling better.  Patient is a 57 y.o. male presenting with suture removal.  Suture / Staple Removal     Past Medical History  Diagnosis Date  . Schizophrenia   . Hypertension   . Hyperlipemia   . GERD (gastroesophageal reflux disease)     Past Surgical History  Procedure Laterality Date  . Knee arthroscopy      History reviewed. No pertinent family history.  History  Substance Use Topics  . Smoking status: Current Every Day Smoker  . Smokeless tobacco: Not on file  . Alcohol Use: Yes      Review of Systems  All other systems reviewed and are negative.    Allergies  Review of patient's allergies indicates no known allergies.  Home Medications   Current Outpatient Rx  Name  Route  Sig  Dispense  Refill  . amLODipine (NORVASC) 10 MG tablet   Oral   Take 10 mg by mouth daily.         Marland Kitchen aspirin 81 MG tablet   Oral   Take 81 mg by mouth daily.         . cloZAPine (CLOZARIL) 100 MG tablet   Oral   Take 200 mg by mouth at bedtime.          Marland Kitchen gemfibrozil (LOPID) 600 MG tablet   Oral   Take 600 mg by mouth 2 (two) times daily.          Marland Kitchen lisinopril (PRINIVIL,ZESTRIL) 40 MG tablet   Oral   Take 40 mg by mouth every morning.          . pantoprazole (PROTONIX) 40 MG tablet   Oral   Take 40 mg by mouth at bedtime.         . pravastatin (PRAVACHOL) 40 MG tablet   Oral   Take 40 mg by mouth at bedtime.         . sulfamethoxazole-trimethoprim (SEPTRA DS) 800-160 MG per tablet   Oral   Take 1 tablet by mouth 2 (two) times daily. X 7 days   14 tablet   0   . HYDROcodone-acetaminophen (NORCO/VICODIN) 5-325 MG per tablet   Oral   Take 1 tablet by mouth every 4 (four) hours as needed for pain.   12 tablet   0     There were no vitals taken for this visit.  Physical Exam  Nursing note and vitals reviewed. Constitutional: He is oriented to person, place, and time. He appears well-developed and well-nourished. He appears distressed.  Pulmonary/Chest: Effort normal.  Musculoskeletal: He exhibits tenderness. He exhibits no edema.  Neurological: He  is alert and oriented to person, place, and time. He exhibits normal muscle tone.  Skin: Skin is warm and dry.  Wound described in history of present illness  Psychiatric: He has a normal mood and affect.    ED Course  Procedures (including critical care time)  Labs Reviewed - No data to display No results found.   1. Wound check, abscess       MDM  The packing was removed. There is no further drainage or exudate from the wound. The dermis surrounding the site of incision remains indurated with erythema. A dressing was then placed. I think once the patient is on the antibiotics twice a day for a couple days that there will be improvement. He is advised that if he is not improving in 2-3 days that he should return. Keep the wound clean. He may shower but after showering make sure you press on the wound so that any water will wick out. Start taking her Septra twice a day rather than one daily. If you have increased pain, development of pus, swelling, red  streaks or other abnormal signs or symptoms recheck promptly.         Hayden Rasmussen, NP 02/21/13 1259

## 2013-02-21 NOTE — ED Notes (Signed)
Pt is here for packing removal from calf of left leg. Wound appear infected and is red/hot to touch.  . Pt states that he just started antibiotics yesterday. And is able to walk a little better.  Denies fever.

## 2013-02-21 NOTE — ED Provider Notes (Signed)
Medical screening examination/treatment/procedure(s) were performed by resident physician or non-physician practitioner and as supervising physician I was immediately available for consultation/collaboration.   Lariyah Shetterly DOUGLAS MD.   Chad Flow D Michiko Lineman, MD 02/21/13 1723 

## 2013-02-22 LAB — CULTURE, ROUTINE-ABSCESS

## 2013-02-24 NOTE — ED Notes (Signed)
Abscess culture L leg: Abundant Staph. Aureus.  Pt. adequately treated with Septra DS. Vassie Moselle 02/24/2013

## 2014-08-13 ENCOUNTER — Encounter (HOSPITAL_COMMUNITY): Payer: Self-pay | Admitting: Emergency Medicine

## 2014-08-13 ENCOUNTER — Emergency Department (HOSPITAL_COMMUNITY)
Admission: EM | Admit: 2014-08-13 | Discharge: 2014-08-17 | Payer: Medicare Other | Attending: Emergency Medicine | Admitting: Emergency Medicine

## 2014-08-13 DIAGNOSIS — I10 Essential (primary) hypertension: Secondary | ICD-10-CM | POA: Diagnosis not present

## 2014-08-13 DIAGNOSIS — Z79899 Other long term (current) drug therapy: Secondary | ICD-10-CM | POA: Diagnosis not present

## 2014-08-13 DIAGNOSIS — E785 Hyperlipidemia, unspecified: Secondary | ICD-10-CM | POA: Insufficient documentation

## 2014-08-13 DIAGNOSIS — K219 Gastro-esophageal reflux disease without esophagitis: Secondary | ICD-10-CM | POA: Insufficient documentation

## 2014-08-13 DIAGNOSIS — F200 Paranoid schizophrenia: Secondary | ICD-10-CM | POA: Diagnosis not present

## 2014-08-13 DIAGNOSIS — Z720 Tobacco use: Secondary | ICD-10-CM | POA: Insufficient documentation

## 2014-08-13 DIAGNOSIS — Z008 Encounter for other general examination: Secondary | ICD-10-CM | POA: Diagnosis present

## 2014-08-13 LAB — CBC WITH DIFFERENTIAL/PLATELET
BASOS ABS: 0 10*3/uL (ref 0.0–0.1)
Basophils Relative: 0 % (ref 0–1)
Eosinophils Absolute: 0.1 10*3/uL (ref 0.0–0.7)
Eosinophils Relative: 1 % (ref 0–5)
HCT: 39.4 % (ref 39.0–52.0)
Hemoglobin: 14.2 g/dL (ref 13.0–17.0)
LYMPHS PCT: 23 % (ref 12–46)
Lymphs Abs: 2.7 10*3/uL (ref 0.7–4.0)
MCH: 30.1 pg (ref 26.0–34.0)
MCHC: 36 g/dL (ref 30.0–36.0)
MCV: 83.5 fL (ref 78.0–100.0)
Monocytes Absolute: 0.9 10*3/uL (ref 0.1–1.0)
Monocytes Relative: 7 % (ref 3–12)
NEUTROS ABS: 8.1 10*3/uL — AB (ref 1.7–7.7)
NEUTROS PCT: 69 % (ref 43–77)
PLATELETS: 293 10*3/uL (ref 150–400)
RBC: 4.72 MIL/uL (ref 4.22–5.81)
RDW: 12.4 % (ref 11.5–15.5)
WBC: 11.7 10*3/uL — AB (ref 4.0–10.5)

## 2014-08-13 NOTE — ED Notes (Signed)
Pt brought in by [employer] with IVC papers.  IVC reports pt is paranoid and is extremely confused, believes his neighbors are after him.  Hasn't taken his meds for the past 8 days.

## 2014-08-14 ENCOUNTER — Encounter (HOSPITAL_COMMUNITY): Payer: Self-pay | Admitting: Psychiatry

## 2014-08-14 DIAGNOSIS — F200 Paranoid schizophrenia: Secondary | ICD-10-CM | POA: Diagnosis present

## 2014-08-14 LAB — COMPREHENSIVE METABOLIC PANEL
ALK PHOS: 79 U/L (ref 39–117)
ALT: 51 U/L (ref 0–53)
AST: 53 U/L — AB (ref 0–37)
Albumin: 4.6 g/dL (ref 3.5–5.2)
Anion gap: 21 — ABNORMAL HIGH (ref 5–15)
BILIRUBIN TOTAL: 0.4 mg/dL (ref 0.3–1.2)
BUN: 22 mg/dL (ref 6–23)
CALCIUM: 9.7 mg/dL (ref 8.4–10.5)
CHLORIDE: 99 meq/L (ref 96–112)
CO2: 18 meq/L — AB (ref 19–32)
Creatinine, Ser: 0.98 mg/dL (ref 0.50–1.35)
GFR calc Af Amer: >90 mL/min (ref >90)
GFR, EST NON AFRICAN AMERICAN: 88 mL/min — AB (ref >90)
Glucose, Bld: 127 mg/dL — ABNORMAL HIGH (ref 70–99)
POTASSIUM: 4.2 meq/L (ref 3.7–5.3)
SODIUM: 138 meq/L (ref 137–147)
Total Protein: 7.4 g/dL (ref 6.0–8.3)

## 2014-08-14 LAB — ETHANOL: Alcohol, Ethyl (B): <11 mg/dL (ref 0–11)

## 2014-08-14 LAB — RAPID URINE DRUG SCREEN, HOSP PERFORMED
Amphetamines: NOT DETECTED
BARBITURATES: NOT DETECTED
Benzodiazepines: NOT DETECTED
COCAINE: NOT DETECTED
Opiates: NOT DETECTED
TETRAHYDROCANNABINOL: NOT DETECTED

## 2014-08-14 MED ORDER — OLANZAPINE 10 MG IM SOLR: 10.0000 mg | Freq: Once | INTRAMUSCULAR | Status: AC | PRN

## 2014-08-14 MED ORDER — GEMFIBROZIL 600 MG PO TABS
600.0000 mg | ORAL_TABLET | Freq: Two times a day (BID) | ORAL | Status: DC
  Administered 2014-08-14 – 2014-08-17 (×6): 600 mg via ORAL
  Filled 2014-08-14 (×8): qty 1

## 2014-08-14 MED ORDER — PRAVASTATIN SODIUM 40 MG PO TABS
40.0000 mg | ORAL_TABLET | Freq: Every day | ORAL | Status: DC
  Administered 2014-08-14 – 2014-08-16 (×3): 40 mg via ORAL
  Filled 2014-08-14 (×4): qty 1

## 2014-08-14 MED ORDER — ZIPRASIDONE MESYLATE 20 MG IM SOLR: 20.0000 mg | Freq: Once | INTRAMUSCULAR | Status: DC | PRN

## 2014-08-14 MED ORDER — CLOZAPINE 100 MG PO TABS
200.0000 mg | ORAL_TABLET | Freq: Every day | ORAL | Status: DC
  Administered 2014-08-14 – 2014-08-16 (×3): 200 mg via ORAL
  Filled 2014-08-14 (×4): qty 2

## 2014-08-14 MED ORDER — PANTOPRAZOLE SODIUM 40 MG PO TBEC
40.0000 mg | DELAYED_RELEASE_TABLET | Freq: Every day | ORAL | Status: DC
  Administered 2014-08-14 – 2014-08-16 (×3): 40 mg via ORAL
  Filled 2014-08-14 (×3): qty 1

## 2014-08-14 MED ORDER — AMLODIPINE BESYLATE 10 MG PO TABS
10.0000 mg | ORAL_TABLET | Freq: Every day | ORAL | Status: DC
  Administered 2014-08-14 – 2014-08-16 (×3): 10 mg via ORAL
  Filled 2014-08-14 (×5): qty 1

## 2014-08-14 MED ORDER — ONDANSETRON 4 MG PO TBDP: 4.0000 mg | ORAL_TABLET | ORAL | Status: DC | PRN

## 2014-08-14 MED ORDER — DIPHENHYDRAMINE HCL 50 MG/ML IJ SOLN: 50.0000 mg | Freq: Once | INTRAMUSCULAR | Status: AC | PRN

## 2014-08-14 MED ORDER — LISINOPRIL 40 MG PO TABS
40.0000 mg | ORAL_TABLET | Freq: Every morning | ORAL | Status: DC
  Administered 2014-08-14 – 2014-08-16 (×3): 40 mg via ORAL
  Filled 2014-08-14 (×4): qty 1

## 2014-08-14 NOTE — BH Assessment (Signed)
Tele Assessment Note   Chad Wang is an 58 y.o. male.  -Clinician talked to Chad Wang who requested TTS consult.  Patient was placed on IVC by brother for increased paranoia and not taking medications for the last 8 days.  IVC cites that patient has been arguing with neighbors and has thrown out most of his food because of paranoia.  Patient is very talkative.  He rambles from one subject to another with no discernible bridge from one topic to another.  When asked if he gets along with neighbors he says "they have their reality and I have mine."   He claims that he has been taking his medications with the exception of the last day or so.  Patient has no SI or HI and he denies any hallucinations.   Patient is seen by Chad Wang for psychiatric medications.  Patient was at [facility] in February of 2014.  He says that he has not been sleeping well.  He was amicable in his affect and denies any current stressors.  Patient also says that he has no anxiety.  -There are no beds at [facility] for patient at this time.  He will be seen by psychiatry in the AM on 10/17 to uphold or recind IVC.  Axis I: 295.90 Schizophrenia Axis II: Deferred Axis III:  Past Medical History  Diagnosis Date  . Schizophrenia   . Hypertension   . Hyperlipemia   . GERD (gastroesophageal reflux disease)    Axis IV: occupational problems and other psychosocial or environmental problems Axis V: 31-40 impairment in reality testing  Past Medical History:  Past Medical History  Diagnosis Date  . Schizophrenia   . Hypertension   . Hyperlipemia   . GERD (gastroesophageal reflux disease)     Past Surgical History  Procedure Laterality Date  . Knee arthroscopy      Family History: No family history on file.  Social History:  reports that he has been smoking.  He does not have any smokeless tobacco history on file. He reports that he drinks alcohol. He reports that he does not use illicit drugs.  Additional Social  History:  Alcohol / Drug Use Pain Medications: See PTA medications list Prescriptions: see PTA medication list Over the Counter: See PTA medication list History of alcohol / drug use?:  (Pt denies use of ETOH.)  CIWA: CIWA-Ar BP: 149/85 mmHg Pulse Rate: 87 COWS:    PATIENT STRENGTHS: (choose at least two) Communication skills Supportive family/friends  Allergies: No Known Allergies  Home Medications:  (Not in a hospital admission)  OB/GYN Status:  No LMP for male patient.  General Assessment Data Location of Assessment: [facility] ED Is this a Tele or Face-to-Face Assessment?: Tele Assessment Is this an Initial Assessment or a Re-assessment for this encounter?: Initial Assessment Living Arrangements: Alone Can pt return to current living arrangement?: Yes Admission Status: Involuntary Is patient capable of signing voluntary admission?: No Transfer from: Acute Hospital Referral Source: Self/Family/Friend     Chad Wang Living Arrangements: Alone Name of Psychiatrist: Dr. Rosine Wang Name of Therapist: Shanon Wang     Risk to self with the past 6 months Suicidal Ideation: No Suicidal Intent: No Is patient at risk for suicide?: No Suicidal Plan?: No Access to Means: No What has been your use of drugs/alcohol within the last 12 months?: Pt denies regular use of ETOH. Previous Attempts/Gestures: No How many times?: 0 Other Self Harm Risks: None Triggers for Past Attempts: None known Intentional  Self Injurious Behavior: None Family Suicide History: Unknown Recent stressful life event(s): Other (Comment) (Pt denies any current stressors) Persecutory voices/beliefs?: Yes Depression: No Depression Symptoms: Insomnia Substance abuse history and/or treatment for substance abuse?: No Suicide prevention information given to non-admitted patients: Not applicable  Risk to Others within the past 6 months Homicidal Ideation: No Thoughts of Harm to Others: No Current Homicidal  Intent: No Current Homicidal Plan: No Access to Homicidal Means: No Identified Victim: No one History of harm to others?: No Assessment of Violence: None Noted Violent Behavior Description: Pt says he does not get into fights Does patient have access to weapons?: No Criminal Charges Pending?: No Does patient have a court date: No  Psychosis Hallucinations: None noted Delusions: None noted  Mental Status Report Appear/Hygiene: Disheveled;In scrubs Eye Contact: Good Motor Activity: Freedom of movement;Unremarkable Speech: Incoherent;Tangential Level of Consciousness: Alert Mood: Apprehensive Affect: Labile Anxiety Level: None Thought Processes: Flight of Ideas Judgement: Unimpaired Orientation: Situation;Place;Person Obsessive Compulsive Thoughts/Behaviors: None  Cognitive Functioning Concentration: Decreased Memory: Recent Impaired;Remote Intact IQ: Average Insight: Poor Impulse Control: Poor Appetite: Good Weight Loss: 0 Weight Gain: 0 Sleep: Decreased Total Hours of Sleep: 6 Vegetative Symptoms: None  ADLScreening [facility] Assessment Services) Patient's cognitive ability adequate to safely complete daily activities?: Yes Patient able to express need for assistance with ADLs?: Yes Independently performs ADLs?: Yes (appropriate for developmental age)  Prior Inpatient Therapy Prior Inpatient Therapy: Yes Prior Therapy Dates: February 2014 Prior Therapy Facilty/Provider(s): [place] Reason for Treatment: psychosis  Prior Outpatient Therapy Prior Outpatient Therapy: Yes Prior Therapy Dates: For the past year Prior Therapy Facilty/Provider(s): Chad. Rosine Wang Reason for Treatment: psychiatric meds  ADL Screening (condition at time of admission) Patient's cognitive ability adequate to safely complete daily activities?: Yes Is the patient deaf or have difficulty hearing?: No Does the patient have difficulty seeing, even when wearing glasses/contacts?: No Does the  patient have difficulty concentrating, remembering, or making decisions?: Yes Patient able to express need for assistance with ADLs?: Yes Does the patient have difficulty dressing or bathing?: No Independently performs ADLs?: Yes (appropriate for developmental age) Does the patient have difficulty walking or climbing stairs?: No Weakness of Legs: None Weakness of Arms/Hands: None       Abuse/Neglect Assessment (Assessment to be complete while patient is alone) Physical Abuse: Denies Verbal Abuse: Yes, past (Comment) (People treated him bad.) Sexual Abuse: Denies Exploitation of patient/patient's resources: Denies Self-Neglect: Denies Values / Beliefs Cultural Requests During Hospitalization: None Spiritual Requests During Hospitalization: None   Advance Directives (For Healthcare) Does patient have an advance directive?: No Would patient like information on creating an advanced directive?: No - patient declined information    Additional Information 1:1 In Past 12 Months?: No CIRT Risk: No Elopement Risk: No Does patient have medical clearance?: Yes     Disposition:  Disposition Initial Assessment Completed for this Encounter: Yes Disposition of Patient: Inpatient treatment program;Referred to Type of inpatient treatment program: Adult Patient referred to: Other (Comment) (Pt to be referred out.  No beds at [facility]  Curlene Dolphin Ray 08/14/2014 1:22 AM

## 2014-08-14 NOTE — ED Notes (Signed)
Naked in his room. I asked that he put his clothes back on, a male doc would be here to talk with him. Stated "I don't care. Send her in here". Gesturing at his penis "see this, this is natural". Closed door. When I returned to nurses station he was observed lying on his stomach being intimate with the bed. Followed by turning self over and rubbing questionable body fluids over his torso and face.

## 2014-08-14 NOTE — ED Provider Notes (Signed)
CSN: [id number]     Arrival date & time 08/13/14  2308 History   First MD Initiated Contact with Patient 08/13/14 2324     Chief Complaint  Patient presents with  . Medical Clearance    HPI Patient has a history of schizophrenia.  His been off his medications for the past 8 days.  Involuntary commitment forms were filled the patient and the patient was brought to the emergency department for evaluation.  As reported that his been more confused and believing that his neighbors are after him.  He recently took all the food in his house and threw it out and in the dumpster.  Unclear why he did this.  He is unable to give a good reason for this.  No other complaints at this time.  No headaches.  No auditory hallucinations that he describes to me.   Past Medical History  Diagnosis Date  . Schizophrenia   . Hypertension   . Hyperlipemia   . GERD (gastroesophageal reflux disease)    Past Surgical History  Procedure Laterality Date  . Knee arthroscopy     History reviewed. No pertinent family history. History  Substance Use Topics  . Smoking status: Current Every Day Smoker  . Smokeless tobacco: Not on file  . Alcohol Use: Yes    Review of Systems  All other systems reviewed and are negative.     Allergies  Review of patient's allergies indicates no known allergies.  Home Medications   Prior to Admission medications   Medication Sig Start Date End Date Taking? Authorizing Provider  amLODipine (NORVASC) 10 MG tablet Take 10 mg by mouth daily.   Yes Historical Provider, MD  cloZAPine (CLOZARIL) 100 MG tablet Take 200 mg by mouth at bedtime.    Yes Historical Provider, MD  gemfibrozil (LOPID) 600 MG tablet Take 600 mg by mouth 2 (two) times daily.   Yes Historical Provider, MD  lisinopril (PRINIVIL,ZESTRIL) 40 MG tablet Take 40 mg by mouth every morning.    Yes Historical Provider, MD  pantoprazole (PROTONIX) 40 MG tablet Take 40 mg by mouth at bedtime.   Yes Historical Provider,  MD  pravastatin (PRAVACHOL) 40 MG tablet Take 40 mg by mouth at bedtime.   Yes Historical Provider, MD   BP 127/78  Pulse 78  Temp(Src) 98.7 F (37.1 C) (Oral)  Resp 18  SpO2 98% Physical Exam  Nursing note and vitals reviewed. Constitutional: He is oriented to person, place, and time. He appears well-developed and well-nourished.  HENT:  Head: Normocephalic and atraumatic.  Eyes: EOM are normal.  Neck: Normal range of motion.  Cardiovascular: Normal rate, regular rhythm, normal heart sounds and intact distal pulses.   Pulmonary/Chest: Effort normal and breath sounds normal. No respiratory distress.  Abdominal: Soft. He exhibits no distension. There is no tenderness.  Musculoskeletal: Normal range of motion.  Neurological: He is alert and oriented to person, place, and time.  Skin: Skin is warm and dry.  Psychiatric:  Delusional thinking    ED Course  Procedures (including critical care time) Labs Review Labs Reviewed  CBC WITH DIFFERENTIAL - Abnormal; Notable for the following:    WBC 11.7 (*)    Neutro Abs 8.1 (*)    All other components within normal limits  COMPREHENSIVE METABOLIC PANEL - Abnormal; Notable for the following:    CO2 18 (*)    Glucose, Bld 127 (*)    AST 53 (*)    GFR calc non Af Amer 88 (*)  Anion gap 21 (*)    All other components within normal limits  URINE RAPID DRUG SCREEN (HOSP PERFORMED)  ETHANOL    Imaging Review No results found.   EKG Interpretation None      MDM   Final diagnoses:  Paranoid type schizophrenia, chronic state with acute exacerbation   DDS to the consult.  IVC will remain at this time.  Medically clear    Hoy Morn, MD 08/14/14 [phone]

## 2014-08-14 NOTE — ED Notes (Signed)
Pt was heard vomiting in the restroom.  Vomit was confirmed by staff. Pt stated "it must have been something I ate" and then requested banana pudding.  Pt was encouraged to consume clear liquids. Pt was given a cup of Ginger[place] upon request.  He has been calm and cooperative and continues to deny pain.

## 2014-08-14 NOTE — ED Notes (Signed)
Patient up at that nurses station talking to himself stating, "You can't keep me locked up in the room like that. My daddy is going to come up here and kill you. The devil is going to come up here. That bitchass pussy. I'll kill you. I'm not taking my medicine."

## 2014-08-14 NOTE — ED Notes (Signed)
Pt. To SAPPU from ED ambulatory without difficulty. Pt. Is alert and oriented, warm and dry in no distress. Pt. Denies SI, HI, and AVH. Pt. Calm and cooperative. Pt. Encouraged to let Nursing staff know of any concerns or needs.

## 2014-08-14 NOTE — Consult Note (Signed)
[facility] Consult   Reason for Consult:  Paranoia Referring Physician:  EDP  Chad Wang is an 58 y.o. male. Total Time spent with patient: 20 minutes  Assessment: AXIS I:  Paranoid schizophrenia, chronic with acute exacerbation AXIS II:  Deferred AXIS III:   Past Medical History  Diagnosis Date  . Schizophrenia   . Hypertension   . Hyperlipemia   . GERD (gastroesophageal reflux disease)    AXIS IV:  other psychosocial or environmental problems and problems related to social environment AXIS V:  11-20 some danger of hurting self or others possible OR occasionally fails to maintain minimal personal hygiene OR gross impairment in communication  Plan:  Recommend psychiatric Inpatient admission when medically cleared. Dr. Parke Poisson assessed the patient and concurs with the plan.  Subjective:   Chad Wang is a 58 y.o. male patient that needs admission for stabilization.  HPI:  The patient has been off his medications for 8 days and paranoia started about his neighbors being after him.  He is currently irritable, disorganized, and frequently removing all of his clothes.  Difficult to redirect at times.  He does not understand why he is here and wants to leave. HPI Elements:   Location:  generalized. Quality:  acute. Severity:  severe. Timing:  constant. Duration:  few days. Context:  not taking his medications.  Past Psychiatric History: Past Medical History  Diagnosis Date  . Schizophrenia   . Hypertension   . Hyperlipemia   . GERD (gastroesophageal reflux disease)     reports that he has been smoking.  He does not have any smokeless tobacco history on file. He reports that he drinks alcohol. He reports that he does not use illicit drugs. History reviewed. No pertinent family history. Family History Substance Abuse: No Family Supports: Yes, List: (Brother is supportive.) Living Arrangements: Alone Can pt return to current living arrangement?:  Yes Abuse/Neglect [facility]) Physical Abuse: Denies Verbal Abuse: Yes, past (Comment) (People treated him bad.) Sexual Abuse: Denies Allergies:  No Known Allergies  ACT Assessment Complete:  Yes:    Educational Status    Risk to Self: Risk to self with the past 6 months Suicidal Ideation: No Suicidal Intent: No Is patient at risk for suicide?: No Suicidal Plan?: No Access to Means: No What has been your use of drugs/alcohol within the last 12 months?: Pt denies regular use of ETOH. Previous Attempts/Gestures: No How many times?: 0 Other Self Harm Risks: None Triggers for Past Attempts: None known Intentional Self Injurious Behavior: None Family Suicide History: Unknown Recent stressful life event(s): Other (Comment) (Pt denies any current stressors) Persecutory voices/beliefs?: Yes Depression: No Depression Symptoms: Insomnia Substance abuse history and/or treatment for substance abuse?: No Suicide prevention information given to non-admitted patients: Not applicable  Risk to Others: Risk to Others within the past 6 months Homicidal Ideation: No Thoughts of Harm to Others: No Current Homicidal Intent: No Current Homicidal Plan: No Access to Homicidal Means: No Identified Victim: No one History of harm to others?: No Assessment of Violence: None Noted Violent Behavior Description: Pt says he does not get into fights Does patient have access to weapons?: No Criminal Charges Pending?: No Does patient have a court date: No  Abuse: Abuse/Neglect Assessment (Assessment to be complete while patient is alone) Physical Abuse: Denies Verbal Abuse: Yes, past (Comment) (People treated him bad.) Sexual Abuse: Denies Exploitation of patient/patient's resources: Denies Self-Neglect: Denies  Prior Inpatient Therapy: Prior Inpatient Therapy Prior Inpatient Therapy: Yes  Prior Therapy Dates: February 2014 Prior Therapy Facilty/Provider([facility]): [facility] Reason for Treatment: psychosis   Prior Outpatient Therapy: Prior Outpatient Therapy Prior Outpatient Therapy: Yes Prior Therapy Dates: For the past year Prior Therapy Facilty/Provider(s): Dr. Rosine Door Reason for Treatment: psychiatric meds  Additional Information: Additional Information 1:1 In Past 12 Months?: No CIRT Risk: No Elopement Risk: No Does patient have medical clearance?: Yes                  Objective: Blood pressure 149/85, pulse 87, temperature 98.3 F (36.8 C), temperature source Oral, resp. rate 20, SpO2 100.00%.There is no weight on file to calculate BMI. Results for orders placed during the hospital encounter of 08/13/14 (from the past 72 hour(s))  CBC WITH DIFFERENTIAL     Status: Abnormal   Collection Time    08/13/14 11:37 PM      Result Value Ref Range   WBC 11.7 (*) 4.0 - 10.5 K/uL   RBC 4.72  4.22 - 5.81 MIL/uL   Hemoglobin 14.2  13.0 - 17.0 g/dL   HCT 39.4  39.0 - 52.0 %   MCV 83.5  78.0 - 100.0 fL   MCH 30.1  26.0 - 34.0 pg   MCHC 36.0  30.0 - 36.0 g/dL   RDW 12.4  11.5 - 15.5 %   Platelets 293  150 - 400 K/uL   Neutrophils Relative % 69  43 - 77 %   Neutro Abs 8.1 (*) 1.7 - 7.7 K/uL   Lymphocytes Relative 23  12 - 46 %   Lymphs Abs 2.7  0.7 - 4.0 K/uL   Monocytes Relative 7  3 - 12 %   Monocytes Absolute 0.9  0.1 - 1.0 K/uL   Eosinophils Relative 1  0 - 5 %   Eosinophils Absolute 0.1  0.0 - 0.7 K/uL   Basophils Relative 0  0 - 1 %   Basophils Absolute 0.0  0.0 - 0.1 K/uL  COMPREHENSIVE METABOLIC PANEL     Status: Abnormal   Collection Time    08/13/14 11:37 PM      Result Value Ref Range   Sodium 138  137 - 147 mEq/L   Potassium 4.2  3.7 - 5.3 mEq/L   Chloride 99  96 - 112 mEq/L   CO2 18 (*) 19 - 32 mEq/L   Glucose, Bld 127 (*) 70 - 99 mg/dL   BUN 22  6 - 23 mg/dL   Creatinine, Ser 0.98  0.50 - 1.35 mg/dL   Calcium 9.7  8.4 - 10.5 mg/dL   Total Protein 7.4  6.0 - 8.3 g/dL   Albumin 4.6  3.5 - 5.2 g/dL   AST 53 (*) 0 - 37 U/L   ALT 51  0 - 53 U/L    Alkaline Phosphatase 79  39 - 117 U/L   Total Bilirubin 0.4  0.3 - 1.2 mg/dL   GFR calc non Af Amer 88 (*) >90 mL/min   GFR calc Af Amer >90  >90 mL/min   Comment: (NOTE)     The eGFR has been calculated using the CKD EPI equation.     This calculation has not been validated in all clinical situations.     eGFR's persistently <90 mL/min signify possible Chronic Kidney     Disease.   Anion gap 21 (*) 5 - 15  ETHANOL     Status: None   Collection Time    08/13/14 11:37 PM      Result  Value Ref Range   Alcohol, Ethyl (B) <11  0 - 11 mg/dL   Comment:            LOWEST DETECTABLE LIMIT FOR     SERUM ALCOHOL IS 11 mg/dL     FOR MEDICAL PURPOSES ONLY  URINE RAPID DRUG SCREEN (HOSP PERFORMED)     Status: None   Collection Time    08/14/14  7:55 AM      Result Value Ref Range   Opiates NONE DETECTED  NONE DETECTED   Cocaine NONE DETECTED  NONE DETECTED   Benzodiazepines NONE DETECTED  NONE DETECTED   Amphetamines NONE DETECTED  NONE DETECTED   Tetrahydrocannabinol NONE DETECTED  NONE DETECTED   Barbiturates NONE DETECTED  NONE DETECTED   Comment:            DRUG SCREEN FOR MEDICAL PURPOSES     ONLY.  IF CONFIRMATION IS NEEDED     FOR ANY PURPOSE, NOTIFY LAB     WITHIN 5 DAYS.                LOWEST DETECTABLE LIMITS     FOR URINE DRUG SCREEN     Drug Class       Cutoff (ng/mL)     Amphetamine      1000     Barbiturate      200     Benzodiazepine   469     Tricyclics       629     Opiates          300     Cocaine          300     THC              50   Labs are reviewed and are pertinent for no medical issues.  Current Facility-Administered Medications  Medication Dose Route Frequency Provider Last Rate Last Dose  . amLODipine (NORVASC) tablet 10 mg  10 mg Oral Daily Blanchie Dessert, MD      . cloZAPine (CLOZARIL) tablet 200 mg  200 mg Oral QHS Blanchie Dessert, MD      . gemfibrozil (LOPID) tablet 600 mg  600 mg Oral BID AC Whitney Plunkett, MD      . lisinopril  (PRINIVIL,ZESTRIL) tablet 40 mg  40 mg Oral q morning - 10a Whitney Plunkett, MD      . pantoprazole (PROTONIX) EC tablet 40 mg  40 mg Oral QHS Blanchie Dessert, MD      . pravastatin (PRAVACHOL) tablet 40 mg  40 mg Oral QHS Blanchie Dessert, MD       Current Outpatient Prescriptions  Medication Sig Dispense Refill  . amLODipine (NORVASC) 10 MG tablet Take 10 mg by mouth daily.      . cloZAPine (CLOZARIL) 100 MG tablet Take 200 mg by mouth at bedtime.       Marland Kitchen gemfibrozil (LOPID) 600 MG tablet Take 600 mg by mouth 2 (two) times daily.      Marland Kitchen lisinopril (PRINIVIL,ZESTRIL) 40 MG tablet Take 40 mg by mouth every morning.       . pantoprazole (PROTONIX) 40 MG tablet Take 40 mg by mouth at bedtime.      . pravastatin (PRAVACHOL) 40 MG tablet Take 40 mg by mouth at bedtime.        Psychiatric Specialty Exam:     Blood pressure 149/85, pulse 87, temperature 98.3 F (36.8 C), temperature source Oral, resp. rate 20, SpO2  100.00%.There is no weight on file to calculate BMI.  General Appearance: Casual  Eye Contact::  Good  Speech:  Normal Rate  Volume:  Increased  Mood:  Anxious and Irritable  Affect:  Blunt  Thought Process:  Disorganized  Orientation:  Other:  person  Thought Content:  Paranoid Ideation  Suicidal Thoughts:  No  Homicidal Thoughts:  No  Memory:  Immediate;   Fair Recent;   Poor Remote;   Poor  Judgement:  Impaired  Insight:  Lacking  Psychomotor Activity:  Increased  Concentration:  Poor  Recall:  Poor  Fund of Knowledge:Fair  Language: Fair  Akathisia:  No  Handed:  Right  AIMS (if indicated):     Assets:  Catering manager Housing Leisure Time Physical Health Resilience Social Support  Sleep:      Musculoskeletal: Strength & Muscle Tone: within normal limits Gait & Station: normal Patient leans: N/A  Treatment Plan Summary: Daily contact with patient to assess and evaluate symptoms and progress in treatment Medication management; home  medications restarted; inpatient hospitalization needed  Waylan Boga, PMH-NP 08/14/2014 1:21 PM  Patient assessed and seen with NP as above

## 2014-08-14 NOTE — ED Notes (Addendum)
Pt denies SI/HI and AVH. Pt denies pain.  Safety checks conducted q 15 minutes.  Pt contracts for safety.

## 2014-08-15 ENCOUNTER — Other Ambulatory Visit: Payer: Self-pay

## 2014-08-15 ENCOUNTER — Encounter (HOSPITAL_COMMUNITY): Payer: Self-pay | Admitting: Registered Nurse

## 2014-08-15 DIAGNOSIS — F200 Paranoid schizophrenia: Secondary | ICD-10-CM | POA: Diagnosis not present

## 2014-08-15 LAB — URINALYSIS, ROUTINE W REFLEX MICROSCOPIC
BILIRUBIN URINE: NEGATIVE
Glucose, UA: NEGATIVE mg/dL
Hgb urine dipstick: NEGATIVE
Ketones, ur: NEGATIVE mg/dL
LEUKOCYTES UA: NEGATIVE
NITRITE: NEGATIVE
PH: 7 (ref 5.0–8.0)
Protein, ur: NEGATIVE mg/dL
Specific Gravity, Urine: 1.003 — ABNORMAL LOW (ref 1.005–1.030)
UROBILINOGEN UA: 0.2 mg/dL (ref 0.0–1.0)

## 2014-08-15 MED ORDER — OLANZAPINE 10 MG IM SOLR
10.0000 mg | Freq: Once | INTRAMUSCULAR | Status: AC
  Administered 2014-08-15: 10 mg via INTRAMUSCULAR
  Filled 2014-08-15: qty 10

## 2014-08-15 NOTE — ED Notes (Signed)
Report received from [facility]. Pt. Alert in no distress denies SI, HI, AVH and pain. Will continue to monitor for safety. Pt. Instructed to come to me with problems or concerns. Q 15 minute checks continue.

## 2014-08-15 NOTE — Progress Notes (Signed)
[facility] contacted the following facilities for psychiatric placement  FAXED REFERRALS 1)[facility] 2)[facility] 3)[facility] 4)[facility] 5)[facility] 6)[facility] 7)[facility] 1)[facility] 2)[facility] 3)[facility] 4)[facility] 5)[facility] 9)[facility] 10)[facility] [place], [facility]

## 2014-08-15 NOTE — BH Assessment (Addendum)
[facility] Assessment Progress Note   Update:  Called the following facilities to follow up on pt referral:  [facility] - Pt possibly accepted for tomorrow, 10/19, per Nunzio Cory @ 1455.  TTS to call first thing in AM to follow up. [facility] - no male ger beds per Cathi Roan - no male gero beds per Minna Merritts, but fax referrals for possible review, as she stated they didn't receive them [facility] - at capacity per Lorin Glass - left message Rosana Hoes - at [place] - at capacity per [facility] - Fax referral again as it was not received - referral faxed for review  TTS to continue to seek placement for the pt.  Shaune Pascal, MS, [facility] Licensed Professional Counselor Therapeutic Triage Specialist [facility] Phone: [phone] Fax: [phone]

## 2014-08-15 NOTE — Consult Note (Signed)
[facility] Follow UP Psychiatry Consult   Reason for Consult:  Paranoia Referring Physician:  EDP  Chad Wang is an 58 y.o. male. Total Time spent with patient: 20 minutes  Assessment: AXIS I:  Paranoid schizophrenia, chronic with acute exacerbation AXIS II:  Deferred AXIS III:   Past Medical History  Diagnosis Date  . Schizophrenia   . Hypertension   . Hyperlipemia   . GERD (gastroesophageal reflux disease)    AXIS IV:  other psychosocial or environmental problems and problems related to social environment AXIS V:  11-20 some danger of hurting self or others possible OR occasionally fails to maintain minimal personal hygiene OR gross impairment in communication  Plan:  Recommend psychiatric Inpatient admission when medically cleared. Dr. Parke Poisson assessed the patient and concurs with the plan.  Subjective:   Chad Wang is a 58 y.o. male patient.  HPI:  Patient states that he is doing better today. Patient also states that he had stopped his medication because of nightmares.  Patient appears to be doing better that yesterday; he is not disorganized, he is cooperative.  Patient speech is slurred this morning.      HPI Elements:   Location:  generalized. Quality:  acute. Severity:  severe. Timing:  constant. Duration:  few days. Context:  not taking his medications.  Past Psychiatric History: Past Medical History  Diagnosis Date  . Schizophrenia   . Hypertension   . Hyperlipemia   . GERD (gastroesophageal reflux disease)     reports that he has been smoking.  He does not have any smokeless tobacco history on file. He reports that he drinks alcohol. He reports that he does not use illicit drugs. History reviewed. No pertinent family history. Family History Substance Abuse: No Family Supports: Yes, List: (Brother is supportive.) Living Arrangements: Alone Can pt return to current living arrangement?: Yes Abuse/Neglect [facility]) Physical Abuse: Denies Verbal Abuse:  Yes, past (Comment) (People treated him bad.) Sexual Abuse: Denies Allergies:  No Known Allergies  ACT Assessment Complete:  Yes:    Educational Status    Risk to Self: Risk to self with the past 6 months Suicidal Ideation: No Suicidal Intent: No Is patient at risk for suicide?: No Suicidal Plan?: No Access to Means: No What has been your use of drugs/alcohol within the last 12 months?: Pt denies regular use of ETOH. Previous Attempts/Gestures: No How many times?: 0 Other Self Harm Risks: None Triggers for Past Attempts: None known Intentional Self Injurious Behavior: None Family Suicide History: Unknown Recent stressful life event(s): Other (Comment) (Pt denies any current stressors) Persecutory voices/beliefs?: Yes Depression: No Depression Symptoms: Insomnia Substance abuse history and/or treatment for substance abuse?: No Suicide prevention information given to non-admitted patients: Not applicable  Risk to Others: Risk to Others within the past 6 months Homicidal Ideation: No Thoughts of Harm to Others: No Current Homicidal Intent: No Current Homicidal Plan: No Access to Homicidal Means: No Identified Victim: No one History of harm to others?: No Assessment of Violence: None Noted Violent Behavior Description: Pt says he does not get into fights Does patient have access to weapons?: No Criminal Charges Pending?: No Does patient have a court date: No  Abuse: Abuse/Neglect Assessment (Assessment to be complete while patient is alone) Physical Abuse: Denies Verbal Abuse: Yes, past (Comment) (People treated him bad.) Sexual Abuse: Denies Exploitation of patient/patient's resources: Denies Self-Neglect: Denies  Prior Inpatient Therapy: Prior Inpatient Therapy Prior Inpatient Therapy: Yes Prior Therapy Dates: February 2014 Prior Therapy  Facilty/Provider(s): [facility] Reason for Treatment: psychosis  Prior Outpatient Therapy: Prior Outpatient Therapy Prior  Outpatient Therapy: Yes Prior Therapy Dates: For the past year Prior Therapy Facilty/Provider(s): Dr. Rosine Door Reason for Treatment: psychiatric meds  Additional Information: Additional Information 1:1 In Past 12 Months?: No CIRT Risk: No Elopement Risk: No Does patient have medical clearance?: Yes    Objective: Blood pressure 112/66, pulse 78, temperature 98.2 F (36.8 C), temperature source Oral, resp. rate 20, SpO2 100.00%.There is no weight on file to calculate BMI. Results for orders placed during the hospital encounter of 08/13/14 (from the past 72 hour(s))  CBC WITH DIFFERENTIAL     Status: Abnormal   Collection Time    08/13/14 11:37 PM      Result Value Ref Range   WBC 11.7 (*) 4.0 - 10.5 K/uL   RBC 4.72  4.22 - 5.81 MIL/uL   Hemoglobin 14.2  13.0 - 17.0 g/dL   HCT 39.4  39.0 - 52.0 %   MCV 83.5  78.0 - 100.0 fL   MCH 30.1  26.0 - 34.0 pg   MCHC 36.0  30.0 - 36.0 g/dL   RDW 12.4  11.5 - 15.5 %   Platelets 293  150 - 400 K/uL   Neutrophils Relative % 69  43 - 77 %   Neutro Abs 8.1 (*) 1.7 - 7.7 K/uL   Lymphocytes Relative 23  12 - 46 %   Lymphs Abs 2.7  0.7 - 4.0 K/uL   Monocytes Relative 7  3 - 12 %   Monocytes Absolute 0.9  0.1 - 1.0 K/uL   Eosinophils Relative 1  0 - 5 %   Eosinophils Absolute 0.1  0.0 - 0.7 K/uL   Basophils Relative 0  0 - 1 %   Basophils Absolute 0.0  0.0 - 0.1 K/uL  COMPREHENSIVE METABOLIC PANEL     Status: Abnormal   Collection Time    08/13/14 11:37 PM      Result Value Ref Range   Sodium 138  137 - 147 mEq/L   Potassium 4.2  3.7 - 5.3 mEq/L   Chloride 99  96 - 112 mEq/L   CO2 18 (*) 19 - 32 mEq/L   Glucose, Bld 127 (*) 70 - 99 mg/dL   BUN 22  6 - 23 mg/dL   Creatinine, Ser 0.98  0.50 - 1.35 mg/dL   Calcium 9.7  8.4 - 10.5 mg/dL   Total Protein 7.4  6.0 - 8.3 g/dL   Albumin 4.6  3.5 - 5.2 g/dL   AST 53 (*) 0 - 37 U/L   ALT 51  0 - 53 U/L   Alkaline Phosphatase 79  39 - 117 U/L   Total Bilirubin 0.4  0.3 - 1.2 mg/dL   GFR calc non  Af Amer 88 (*) >90 mL/min   GFR calc Af Amer >90  >90 mL/min   Comment: (NOTE)     The eGFR has been calculated using the CKD EPI equation.     This calculation has not been validated in all clinical situations.     eGFR's persistently <90 mL/min signify possible Chronic Kidney     Disease.   Anion gap 21 (*) 5 - 15  ETHANOL     Status: None   Collection Time    08/13/14 11:37 PM      Result Value Ref Range   Alcohol, Ethyl (B) <11  0 - 11 mg/dL   Comment:  LOWEST DETECTABLE LIMIT FOR     SERUM ALCOHOL IS 11 mg/dL     FOR MEDICAL PURPOSES ONLY  URINE RAPID DRUG SCREEN (HOSP PERFORMED)     Status: None   Collection Time    08/14/14  7:55 AM      Result Value Ref Range   Opiates NONE DETECTED  NONE DETECTED   Cocaine NONE DETECTED  NONE DETECTED   Benzodiazepines NONE DETECTED  NONE DETECTED   Amphetamines NONE DETECTED  NONE DETECTED   Tetrahydrocannabinol NONE DETECTED  NONE DETECTED   Barbiturates NONE DETECTED  NONE DETECTED   Comment:            DRUG SCREEN FOR MEDICAL PURPOSES     ONLY.  IF CONFIRMATION IS NEEDED     FOR ANY PURPOSE, NOTIFY LAB     WITHIN 5 DAYS.                LOWEST DETECTABLE LIMITS     FOR URINE DRUG SCREEN     Drug Class       Cutoff (ng/mL)     Amphetamine      1000     Barbiturate      200     Benzodiazepine   400     Tricyclics       867     Opiates          300     Cocaine          300     THC              50   Labs are reviewed and are pertinent for no medical issues.  Current Facility-Administered Medications  Medication Dose Route Frequency Provider Last Rate Last Dose  . amLODipine (NORVASC) tablet 10 mg  10 mg Oral Daily Blanchie Dessert, MD   10 mg at 08/15/14 0913  . cloZAPine (CLOZARIL) tablet 200 mg  200 mg Oral QHS Blanchie Dessert, MD   200 mg at 08/14/14 2147  . gemfibrozil (LOPID) tablet 600 mg  600 mg Oral BID AC Blanchie Dessert, MD   600 mg at 08/15/14 0913  . lisinopril (PRINIVIL,ZESTRIL) tablet 40 mg  40 mg  Oral q morning - 10a Blanchie Dessert, MD   40 mg at 08/15/14 0913  . ondansetron (ZOFRAN-ODT) disintegrating tablet 4 mg  4 mg Oral Q4H PRN Blanchie Dessert, MD      . pantoprazole (PROTONIX) EC tablet 40 mg  40 mg Oral QHS Blanchie Dessert, MD   40 mg at 08/14/14 2147  . pravastatin (PRAVACHOL) tablet 40 mg  40 mg Oral QHS Blanchie Dessert, MD   40 mg at 08/14/14 2147   Current Outpatient Prescriptions  Medication Sig Dispense Refill  . amLODipine (NORVASC) 10 MG tablet Take 10 mg by mouth daily.      . cloZAPine (CLOZARIL) 100 MG tablet Take 200 mg by mouth at bedtime.       Marland Kitchen gemfibrozil (LOPID) 600 MG tablet Take 600 mg by mouth 2 (two) times daily.      Marland Kitchen lisinopril (PRINIVIL,ZESTRIL) 40 MG tablet Take 40 mg by mouth every morning.       . pantoprazole (PROTONIX) 40 MG tablet Take 40 mg by mouth at bedtime.      . pravastatin (PRAVACHOL) 40 MG tablet Take 40 mg by mouth at bedtime.        Psychiatric Specialty Exam:     Blood pressure 112/66, pulse 78, temperature  98.2 F (36.8 C), temperature source Oral, resp. rate 20, SpO2 100.00%.There is no weight on file to calculate BMI.  General Appearance: Casual  Eye Contact::  Good  Speech:  Normal Rate  Volume:  Increased  Mood:  Anxious and Irritable  Affect:  Blunt  Thought Process:  Disorganized  Orientation:  Other:  person  Thought Content:  Paranoid Ideation  Suicidal Thoughts:  No  Homicidal Thoughts:  No  Memory:  Immediate;   Fair Recent;   Fair Remote;   Fair  Judgement:  Impaired  Insight:  Lacking  Psychomotor Activity:  Increased  Concentration:  Fair  Recall:  [place]: Fair  Akathisia:  No  Handed:  Right  AIMS (if indicated):     Assets:  Catering manager Housing Leisure Time Physical Health Resilience Social Support  Sleep:      Musculoskeletal: Strength & Muscle Tone: within normal limits Gait & Station: normal Patient leans: N/A  Treatment Plan  Summary: Daily contact with patient to assess and evaluate symptoms and progress in treatment Medication management; home medications restarted; inpatient hospitalization needed  Will continue with current plan for inpatient treatment.  Continue psychotropic medication.    Earleen Newport, FNP-BC  08/15/2014 1:43 PM  Patient seen and assessed with NP as above.

## 2014-08-16 DIAGNOSIS — F200 Paranoid schizophrenia: Secondary | ICD-10-CM | POA: Diagnosis not present

## 2014-08-16 MED ORDER — IBUPROFEN 200 MG PO TABS
600.0000 mg | ORAL_TABLET | Freq: Four times a day (QID) | ORAL | Status: DC | PRN
  Administered 2014-08-16: 600 mg via ORAL
  Filled 2014-08-16: qty 3

## 2014-08-16 NOTE — Progress Notes (Signed)
  CARE MANAGEMENT ED NOTE 08/16/2014  Patient:  Chad Wang, Chad Wang   Account Number:  [id number]  Date Initiated:  08/16/2014  Documentation initiated by:  Jackelyn Poling  Subjective/Objective Assessment:   58 yr old male medicare pt  brought in by [employer] with IVC papers.  IVC reports pt is paranoid and is extremely confused, believes his neighbors are after him.  Hasn't taken his meds for the past 8 days.     Subjective/Objective Assessment Detail:   pt states pcp is dr m Hamrick  dx Paranoid schizophrenia, chronic with acute exacerbation     Action/Plan:   epic updated   Action/Plan Detail:   Anticipated DC Date:       Status Recommendation to Physician:   Result of Recommendation:    Other ED Services  Consult [facility]  Other  Outpatient Services - Pt will follow up  PCP issues    Choice offered to / List presented to:            Status of service:  Completed, signed off  ED Comments:   ED Comments Detail:

## 2014-08-16 NOTE — ED Notes (Signed)
Pt. Up at bedside with his pants off. Pt. Urinated in the floor. Pt. Cleaned and redressed and back to bed.

## 2014-08-16 NOTE — Progress Notes (Addendum)
Per Nunzio Cory patient accepted to [facility], bed to be ready after 12pm. CSW updated RN.   Chad Wang [phone]  ED CSW 08/16/2014 852am

## 2014-08-16 NOTE — ED Notes (Signed)
[facility] department call and spoke with [facility], RN and report that they would be unable to transport patient to [facility]. [place], RN called [facility] and informed them that patient would not be transported until tomorrow morning. Patient informed of the same.

## 2014-08-16 NOTE — Consult Note (Signed)
[facility] Face-to-Face Psychiatry Consult   Reason for Consult:  Paranoia, believes that his neighbors are after him. Referring Physician:  EDP  Chad Wang is an 58 y.o. male. Total Time spent with patient: 20 minutes  Assessment: AXIS I:  Paranoid schizophrenia, chronic with acute exacerbation AXIS II:  Deferred AXIS III:   Past Medical History  Diagnosis Date  . Schizophrenia   . Hypertension   . Hyperlipemia   . GERD (gastroesophageal reflux disease)    AXIS IV:  other psychosocial or environmental problems and problems related to social environment AXIS V:  11-20 some danger of hurting self or others possible OR occasionally fails to maintain minimal personal hygiene OR gross impairment in communication  Plan:  Recommend psychiatric Inpatient admission when medically cleared.  Subjective:   Chad Wang is a 58 y.o. male patient that needs admission for stabilization.  HPI:  Patient seen and chart reviewed. He report history of Chronic paranoid schizophrenia and multiple prior inpatient hospitalizations. Patient was off his medications for many days and developed delusional thinking after that with fixation on his neighbors being after him.  He is currently irritable, disorganized, intrusive, needs constant redirections but difficult to redirect at times.  He does not understand why he is here and wants to leave, as a result he has been pacing the unit back and forth.  HPI Elements:   Location:  delusional, disorganized. Quality:  acute. Severity:  severe. Timing:  constant. Duration:  few days. Context:  not taking his medications.  Past Psychiatric History: Past Medical History  Diagnosis Date  . Schizophrenia   . Hypertension   . Hyperlipemia   . GERD (gastroesophageal reflux disease)     reports that he has been smoking.  He does not have any smokeless tobacco history on file. He reports that he drinks alcohol. He reports that he does not use illicit  drugs. History reviewed. No pertinent family history. Family History Substance Abuse: No Family Supports: Yes, List: (Brother is supportive.) Living Arrangements: Alone Can pt return to current living arrangement?: Yes Abuse/Neglect [facility]) Physical Abuse: Denies Verbal Abuse: Yes, past (Comment) (People treated him bad.) Sexual Abuse: Denies Allergies:  No Known Allergies  ACT Assessment Complete:  Yes:    Educational Status    Risk to Self: Risk to self with the past 6 months Suicidal Ideation: No Suicidal Intent: No Is patient at risk for suicide?: No Suicidal Plan?: No Access to Means: No What has been your use of drugs/alcohol within the last 12 months?: Pt denies regular use of ETOH. Previous Attempts/Gestures: No How many times?: 0 Other Self Harm Risks: None Triggers for Past Attempts: None known Intentional Self Injurious Behavior: None Family Suicide History: Unknown Recent stressful life event(s): Other (Comment) (Pt denies any current stressors) Persecutory voices/beliefs?: Yes Depression: No Depression Symptoms: Insomnia Substance abuse history and/or treatment for substance abuse?: No Suicide prevention information given to non-admitted patients: Not applicable  Risk to Others: Risk to Others within the past 6 months Homicidal Ideation: No Thoughts of Harm to Others: No Current Homicidal Intent: No Current Homicidal Plan: No Access to Homicidal Means: No Identified Victim: No one History of harm to others?: No Assessment of Violence: None Noted Violent Behavior Description: Pt says he does not get into fights Does patient have access to weapons?: No Criminal Charges Pending?: No Does patient have a court date: No  Abuse: Abuse/Neglect Assessment (Assessment to be complete while patient is alone) Physical Abuse: Denies Verbal Abuse:  Yes, past (Comment) (People treated him bad.) Sexual Abuse: Denies Exploitation of patient/patient's resources:  Denies Self-Neglect: Denies  Prior Inpatient Therapy: Prior Inpatient Therapy Prior Inpatient Therapy: Yes Prior Therapy Dates: February 2014 Prior Therapy Facilty/Provider(s): [facility] Reason for Treatment: psychosis  Prior Outpatient Therapy: Prior Outpatient Therapy Prior Outpatient Therapy: Yes Prior Therapy Dates: For the past year Prior Therapy Facilty/Provider(s): Dr. Rosine Door Reason for Treatment: psychiatric meds  Additional Information: Additional Information 1:1 In Past 12 Months?: No CIRT Risk: No Elopement Risk: No Does patient have medical clearance?: Yes                  Objective: Blood pressure 110/79, pulse 90, temperature 98 F (36.7 C), temperature source Oral, resp. rate 16, SpO2 100.00%.There is no weight on file to calculate BMI. Results for orders placed during the hospital encounter of 08/13/14 (from the past 72 hour(s))  CBC WITH DIFFERENTIAL     Status: Abnormal   Collection Time    08/13/14 11:37 PM      Result Value Ref Range   WBC 11.7 (*) 4.0 - 10.5 K/uL   RBC 4.72  4.22 - 5.81 MIL/uL   Hemoglobin 14.2  13.0 - 17.0 g/dL   HCT 39.4  39.0 - 52.0 %   MCV 83.5  78.0 - 100.0 fL   MCH 30.1  26.0 - 34.0 pg   MCHC 36.0  30.0 - 36.0 g/dL   RDW 12.4  11.5 - 15.5 %   Platelets 293  150 - 400 K/uL   Neutrophils Relative % 69  43 - 77 %   Neutro Abs 8.1 (*) 1.7 - 7.7 K/uL   Lymphocytes Relative 23  12 - 46 %   Lymphs Abs 2.7  0.7 - 4.0 K/uL   Monocytes Relative 7  3 - 12 %   Monocytes Absolute 0.9  0.1 - 1.0 K/uL   Eosinophils Relative 1  0 - 5 %   Eosinophils Absolute 0.1  0.0 - 0.7 K/uL   Basophils Relative 0  0 - 1 %   Basophils Absolute 0.0  0.0 - 0.1 K/uL  COMPREHENSIVE METABOLIC PANEL     Status: Abnormal   Collection Time    08/13/14 11:37 PM      Result Value Ref Range   Sodium 138  137 - 147 mEq/L   Potassium 4.2  3.7 - 5.3 mEq/L   Chloride 99  96 - 112 mEq/L   CO2 18 (*) 19 - 32 mEq/L   Glucose, Bld 127 (*) 70 - 99  mg/dL   BUN 22  6 - 23 mg/dL   Creatinine, Ser 0.98  0.50 - 1.35 mg/dL   Calcium 9.7  8.4 - 10.5 mg/dL   Total Protein 7.4  6.0 - 8.3 g/dL   Albumin 4.6  3.5 - 5.2 g/dL   AST 53 (*) 0 - 37 U/L   ALT 51  0 - 53 U/L   Alkaline Phosphatase 79  39 - 117 U/L   Total Bilirubin 0.4  0.3 - 1.2 mg/dL   GFR calc non Af Amer 88 (*) >90 mL/min   GFR calc Af Amer >90  >90 mL/min   Comment: (NOTE)     The eGFR has been calculated using the CKD EPI equation.     This calculation has not been validated in all clinical situations.     eGFR's persistently <90 mL/min signify possible Chronic Kidney     Disease.   Anion gap 21 (*)  5 - 15  ETHANOL     Status: None   Collection Time    08/13/14 11:37 PM      Result Value Ref Range   Alcohol, Ethyl (B) <11  0 - 11 mg/dL   Comment:            LOWEST DETECTABLE LIMIT FOR     SERUM ALCOHOL IS 11 mg/dL     FOR MEDICAL PURPOSES ONLY  URINE RAPID DRUG SCREEN (HOSP PERFORMED)     Status: None   Collection Time    08/14/14  7:55 AM      Result Value Ref Range   Opiates NONE DETECTED  NONE DETECTED   Cocaine NONE DETECTED  NONE DETECTED   Benzodiazepines NONE DETECTED  NONE DETECTED   Amphetamines NONE DETECTED  NONE DETECTED   Tetrahydrocannabinol NONE DETECTED  NONE DETECTED   Barbiturates NONE DETECTED  NONE DETECTED   Comment:            DRUG SCREEN FOR MEDICAL PURPOSES     ONLY.  IF CONFIRMATION IS NEEDED     FOR ANY PURPOSE, NOTIFY LAB     WITHIN 5 DAYS.                LOWEST DETECTABLE LIMITS     FOR URINE DRUG SCREEN     Drug Class       Cutoff (ng/mL)     Amphetamine      1000     Barbiturate      200     Benzodiazepine   097     Tricyclics       353     Opiates          300     Cocaine          300     THC              50  URINALYSIS, ROUTINE W REFLEX MICROSCOPIC     Status: Abnormal   Collection Time    08/15/14  4:54 PM      Result Value Ref Range   Color, Urine YELLOW  YELLOW   APPearance CLEAR  CLEAR   Specific Gravity, Urine  1.003 (*) 1.005 - 1.030   pH 7.0  5.0 - 8.0   Glucose, UA NEGATIVE  NEGATIVE mg/dL   Hgb urine dipstick NEGATIVE  NEGATIVE   Bilirubin Urine NEGATIVE  NEGATIVE   Ketones, ur NEGATIVE  NEGATIVE mg/dL   Protein, ur NEGATIVE  NEGATIVE mg/dL   Urobilinogen, UA 0.2  0.0 - 1.0 mg/dL   Nitrite NEGATIVE  NEGATIVE   Leukocytes, UA NEGATIVE  NEGATIVE   Comment: MICROSCOPIC NOT DONE ON URINES WITH NEGATIVE PROTEIN, BLOOD, LEUKOCYTES, NITRITE, OR GLUCOSE <1000 mg/dL.   Labs are reviewed and are pertinent for no medical issues.  Current Facility-Administered Medications  Medication Dose Route Frequency Provider Last Rate Last Dose  . amLODipine (NORVASC) tablet 10 mg  10 mg Oral Daily Blanchie Dessert, MD   10 mg at 08/15/14 0913  . cloZAPine (CLOZARIL) tablet 200 mg  200 mg Oral QHS Blanchie Dessert, MD   200 mg at 08/15/14 2142  . gemfibrozil (LOPID) tablet 600 mg  600 mg Oral BID AC Blanchie Dessert, MD   600 mg at 08/16/14 0853  . lisinopril (PRINIVIL,ZESTRIL) tablet 40 mg  40 mg Oral q morning - 10a Blanchie Dessert, MD   40 mg at 08/15/14 0913  . ondansetron (ZOFRAN-ODT) disintegrating  tablet 4 mg  4 mg Oral Q4H PRN Blanchie Dessert, MD      . pantoprazole (PROTONIX) EC tablet 40 mg  40 mg Oral QHS Blanchie Dessert, MD   40 mg at 08/15/14 2143  . pravastatin (PRAVACHOL) tablet 40 mg  40 mg Oral QHS Blanchie Dessert, MD   40 mg at 08/15/14 2144   Current Outpatient Prescriptions  Medication Sig Dispense Refill  . amLODipine (NORVASC) 10 MG tablet Take 10 mg by mouth daily.      . cloZAPine (CLOZARIL) 100 MG tablet Take 200 mg by mouth at bedtime.       Marland Kitchen gemfibrozil (LOPID) 600 MG tablet Take 600 mg by mouth 2 (two) times daily.      Marland Kitchen lisinopril (PRINIVIL,ZESTRIL) 40 MG tablet Take 40 mg by mouth every morning.       . pantoprazole (PROTONIX) 40 MG tablet Take 40 mg by mouth at bedtime.      . pravastatin (PRAVACHOL) 40 MG tablet Take 40 mg by mouth at bedtime.        Psychiatric Specialty  Exam:     Blood pressure 110/79, pulse 90, temperature 98 F (36.7 C), temperature source Oral, resp. rate 16, SpO2 100.00%.There is no weight on file to calculate BMI.  General Appearance: Casual  Eye Contact::  Good  Speech:  Pressured and Slow  Volume:  Increased  Mood:  Anxious and Irritable  Affect:  Blunt  Thought Process:  Disorganized  Orientation:  Other:  person  Thought Content:  Delusions and Paranoid Ideation  Suicidal Thoughts:  No  Homicidal Thoughts:  No  Memory:  Immediate;   Fair Recent;   Poor Remote;   Poor  Judgement:  Impaired  Insight:  Lacking  Psychomotor Activity:  Increased  Concentration:  Poor  Recall:  Poor  Fund of Knowledge:Fair  Language: Fair  Akathisia:  No  Handed:  Right  AIMS (if indicated):     Assets:  Catering manager Housing Leisure Time Physical Health Resilience Social Support  Sleep:   poor   Musculoskeletal: Strength & Muscle Tone: within normal limits Gait & Station: normal Patient leans: N/A  Treatment Plan Summary: Daily contact with patient to assess and evaluate symptoms and progress in treatment Medication management; Patient will benefit from inpatient admission and has been accepted at [facility].  Corena Pilgrim, MD 08/16/2014 9:34 AM

## 2014-08-16 NOTE — Discharge Instructions (Signed)
Paranoia Paranoia is a distrust of others that is not based on a real reason for distrust. This may reach delusional levels. This means the paranoid person feels the world is against them when there is no reason to make them feel that way. People with paranoia feel as though people around them are "out to get them".  SIMILAR MENTAL ILNESSES  Depression is a feeling as though you are down all the time. It is normal in some situations where you have just lost a loved one. It is abnormal if you are having feelings of paranoia with it.  Dementia is a physical problem with the brain in which the brain no longer works properly. There are problems with daily activities of living. Alzheimer's disease is one example of this. Dementia is also caused by old age changes in the brain which come with the death of brain cells and small strokes.  Paranoidschizophrenia. People with paranoid schizophrenia and persecutory delusional disorder have delusions in which they feel people around them are plotting against them. Persecutory delusions in paranoid schizophrenia are bizarre, sometimes grandiose, and often accompanied by auditory hallucinations. This means the person is hearing voices that are not there.  Delusionaldisorder (persecutory type). Delusions experienced by individuals with delusional disorder are more believable than those experienced by paranoid schizophrenics; they are not bizarre, though still unjustified. Individuals with delusional disorder may seem offbeat or quirky rather than mentally ill, and therefore, may never seek treatment. All of these problems usually do not allow these people to interact socially in an acceptable manner. CAUSES The cause of paranoia is often not known. It is common in people with extended abuse of:  Cocaine.  Amphetamine.  Marijuana.  Alcohol. Sometimes there is an inherited tendency. It may be associated with stress or changes in brain chemistry. DIAGNOSIS    When paranoia is present, your caregiver may:  Refer you to a specialist.  Do a physical exam.  Perform other tests on you to make sure there are not other problems causing the paranoia including:  Physical problems.  Mental problems.  Chemical problems (other than drugs). Testing may be done to determine if there is a psychiatric disability present that can be treated with medicine. TREATMENT   Paranoia that is a symptom of a psychiatric problem should be treated by professionals.  Medicines are available which can help this disorder. Antipsychotic medicine may be prescribed by your caregiver.  Sometimes psychotherapy may be useful.  Conditions such as depression or drug abuse are treated individually. If the paranoia is caused by drug abuse, a treatment facility may be helpful. Depression may be helped by antidepressants. PROGNOSIS   Paranoid people are difficult to treat because of their belief that everyone is out to get them or harm them. Because of this mistrust, they often must be talked into entering treatment by a trusted family member or friend. They may not want to take medicine as they may see this as an attempt to poison them.  Gradual gains in the trust of a therapist or caregiver helps in a successful treatment plan.  Some people with PPD or persecutory delusional disorder function in society without treatment in limited fashion. Document Released: 10/18/2003 Document Revised: 01/07/2012 Document Reviewed: 06/22/2008 [facility] Patient Information 2015 [place], [place]. This information is not intended to replace advice given to you by your health care provider. Make sure you discuss any questions you have with your health care provider.

## 2014-08-16 NOTE — ED Notes (Signed)
Patient has been up and about on the unit.  Multiple requests for coffee, milk and other snacks.  He has been cooperative, but somewhat confused.  He keeps asking the same questions over and over.  He knows that he will eventually be going to [facility] and has asked about that several times.  Call from [place] [facility] [place] who states they are able to take the patient today.  Call to [facility] and message left to call back regarding transport.

## 2014-08-16 NOTE — ED Notes (Signed)
Pt. Up again. Escourted to the rest room and back to bed.

## 2014-08-16 NOTE — BH Assessment (Addendum)
Received a call from [facility] stating that patient's bed is available. Writer notified patient's nurse that bed is available. Nursing report [phone].

## 2014-08-17 DIAGNOSIS — F200 Paranoid schizophrenia: Secondary | ICD-10-CM | POA: Diagnosis not present

## 2014-08-17 NOTE — ED Notes (Signed)
Patient left the unit ambulatory in the custody of the [facility].  He was cooperative and expressed understanding of the transfer plan.

## 2014-08-17 NOTE — Progress Notes (Signed)
CSW confirmed with Nunzio Cory that patient has been accepted and can be transferred now. Per chart review patient on transportation list with sheriff.  Noreene Larsson [phone]  ED CSW 08/17/2014 858am

## 2014-08-17 NOTE — ED Notes (Signed)
Call from [facility] stating they will be here shortly to pick patient up.  Report called to Nunzio Cory at [facility].  Patient aware of transport.

## 2014-08-17 NOTE — ED Notes (Signed)
[facility] department called and patient placed on transportation list.

## 2014-12-10 ENCOUNTER — Encounter: Payer: Self-pay | Admitting: Internal Medicine

## 2015-08-05 ENCOUNTER — Emergency Department (HOSPITAL_COMMUNITY)
Admission: EM | Admit: 2015-08-05 | Discharge: 2015-08-05 | Disposition: A | Discharge status: Home / Self Care | Payer: Medicare Other | Attending: Emergency Medicine | Admitting: Emergency Medicine

## 2015-08-05 ENCOUNTER — Encounter (HOSPITAL_COMMUNITY): Payer: Self-pay

## 2015-08-05 DIAGNOSIS — K219 Gastro-esophageal reflux disease without esophagitis: Secondary | ICD-10-CM | POA: Insufficient documentation

## 2015-08-05 DIAGNOSIS — R200 Anesthesia of skin: Secondary | ICD-10-CM | POA: Diagnosis not present

## 2015-08-05 DIAGNOSIS — K002 Abnormalities of size and form of teeth: Secondary | ICD-10-CM | POA: Diagnosis not present

## 2015-08-05 DIAGNOSIS — I10 Essential (primary) hypertension: Secondary | ICD-10-CM | POA: Insufficient documentation

## 2015-08-05 DIAGNOSIS — Z79899 Other long term (current) drug therapy: Secondary | ICD-10-CM | POA: Insufficient documentation

## 2015-08-05 DIAGNOSIS — K0889 Other specified disorders of teeth and supporting structures: Secondary | ICD-10-CM

## 2015-08-05 DIAGNOSIS — Z720 Tobacco use: Secondary | ICD-10-CM | POA: Insufficient documentation

## 2015-08-05 DIAGNOSIS — F209 Schizophrenia, unspecified: Secondary | ICD-10-CM | POA: Insufficient documentation

## 2015-08-05 DIAGNOSIS — E785 Hyperlipidemia, unspecified: Secondary | ICD-10-CM | POA: Diagnosis not present

## 2015-08-05 DIAGNOSIS — K029 Dental caries, unspecified: Secondary | ICD-10-CM | POA: Insufficient documentation

## 2015-08-05 MED ORDER — PENICILLIN V POTASSIUM 500 MG PO TABS: 500.0000 mg | ORAL_TABLET | Freq: Four times a day (QID) | ORAL | Status: AC

## 2015-08-05 NOTE — ED Provider Notes (Signed)
CSN: [id number]     Arrival date & time 08/05/15  1347 History   First MD Initiated Contact with Patient 08/05/15 1449     Chief Complaint  Patient presents with  . Dental Problem     (Consider location/radiation/quality/duration/timing/severity/associated sxs/prior Treatment) HPI Comments: Patient presents today with a chief complaint of dental pain.  He states that the pain has been present for weeks, but worsened yesterday.  He states that the left lower tooth is loose and he has some soreness of his surrounding gingiva.  He also reports that it feels like the left side of his face is swollen.  He also reports that last evening he began having some numbness of the left side of his lips.  He states that he notices the numbness more when eating or drinking something.  He denies any numbness of his tongue or the rest of his face.  He has taken Ibuprofen for the dental pain with some improvement.  He reports that he called his dentist this morning and has a appointment scheduled for Monday in 3 days.  However, he states that he was instructed by the staff to come to the ED to get an antibiotic for the dental pain.  He states that he had some dizziness last evening when he got up to go to the bathroom, which lasted for approximately 30 seconds and then resolved completely.  No dizziness at this time.  He denies headache, weakness, numbness of his face aside from the lips, numbness of the tongue, ataxia, vision changes, difficulty swallowing, fever, or chills.  The history is provided by the patient.    Past Medical History  Diagnosis Date  . Schizophrenia ([place])   . Hypertension   . Hyperlipemia   . GERD (gastroesophageal reflux disease)    Past Surgical History  Procedure Laterality Date  . Knee arthroscopy     History reviewed. No pertinent family history. Social History  Substance Use Topics  . Smoking status: Current Every Day Smoker  . Smokeless tobacco: None  . Alcohol Use: Yes     Review of Systems  All other systems reviewed and are negative.     Allergies  Review of patient's allergies indicates no known allergies.  Home Medications   Prior to Admission medications   Medication Sig Start Date End Date Taking? Authorizing Provider  amLODipine (NORVASC) 10 MG tablet Take 10 mg by mouth daily.    Historical Provider, MD  cloZAPine (CLOZARIL) 100 MG tablet Take 200 mg by mouth at bedtime.     Historical Provider, MD  gemfibrozil (LOPID) 600 MG tablet Take 600 mg by mouth 2 (two) times daily.    Historical Provider, MD  lisinopril (PRINIVIL,ZESTRIL) 40 MG tablet Take 40 mg by mouth every morning.     Historical Provider, MD  pantoprazole (PROTONIX) 40 MG tablet Take 40 mg by mouth at bedtime.    Historical Provider, MD  pravastatin (PRAVACHOL) 40 MG tablet Take 40 mg by mouth at bedtime.    Historical Provider, MD   BP 121/78 mmHg  Pulse 82  Temp(Src) 98.1 F (36.7 C) (Oral)  Resp 18  SpO2 97% Physical Exam  Constitutional: He appears well-developed and well-nourished.  HENT:  Head: Normocephalic and atraumatic.  Mouth/Throat: No trismus in the jaw. Abnormal dentition. Dental caries present. No dental abscesses.  Very poor dentition No obvious abscess visualized or palpated No sublingual tenderness or swelling No trismus No sublingual or submental lymphadenopathy   Eyes: EOM are normal.  Pupils are equal, round, and reactive to light.  Neck: Normal range of motion. Neck supple.  Cardiovascular: Normal rate, regular rhythm and normal heart sounds.   Pulmonary/Chest: Effort normal and breath sounds normal.  Musculoskeletal: Normal range of motion.  Neurological: He is alert. He has normal strength. No cranial nerve deficit or sensory deficit. Coordination and gait normal.  Normal gait, no ataxia No facial asymmetry  Skin: Skin is warm and dry.  Psychiatric: He has a normal mood and affect.  Nursing note and vitals reviewed.   ED Course   Procedures (including critical care time) Labs Review Labs Reviewed - No data to display  Imaging Review No results found. I have personally reviewed and evaluated these images and lab results as part of my medical decision-making.   EKG Interpretation None      MDM   Final diagnoses:  None   Patient presents today with left lower dental pain and also numbness of his left lips.  Patient with very poor dentition on exam and tenderness to palpation of the left gingiva.  No signs of Ludwig Angina.  He reports that sensation of the left lips is mildly decreased, but no facial droop or other neurological symptoms.  Normal neuro exam.  Patient has a dentist appointment scheduled on Monday in 3 days.  Feel that the patient is stable for discharge.  Return precautions given.     Hyman Bible, PA-C 08/07/15 Parks Liu, MD 08/08/15 [phone]

## 2015-08-05 NOTE — ED Notes (Signed)
Pt presents with onset of lips tingling and numbness to mouth that pt noted last night, reports dizziness last night that has resolved.  Pt attributed it to tooth pain to R lower side (has dentist appointment in November)  Pt taking ibuprofen for tooth pain; denies any headache, blurred vision.  Pt denies any symptoms to tongue only to lips.

## 2015-10-30 DIAGNOSIS — C801 Malignant (primary) neoplasm, unspecified: Secondary | ICD-10-CM

## 2015-10-30 HISTORY — DX: Malignant (primary) neoplasm, unspecified: C80.1

## 2016-01-09 ENCOUNTER — Ambulatory Visit (INDEPENDENT_AMBULATORY_CARE_PROVIDER_SITE_OTHER): Payer: Medicare Other | Admitting: Family Medicine

## 2016-01-09 ENCOUNTER — Ambulatory Visit (INDEPENDENT_AMBULATORY_CARE_PROVIDER_SITE_OTHER): Payer: Medicare Other

## 2016-01-09 VITALS — BP 140/86 | HR 113 | Temp 97.9°F | Resp 20 | Ht 69.0 in | Wt 161.8 lb

## 2016-01-09 DIAGNOSIS — R9389 Abnormal findings on diagnostic imaging of other specified body structures: Secondary | ICD-10-CM

## 2016-01-09 DIAGNOSIS — R938 Abnormal findings on diagnostic imaging of other specified body structures: Secondary | ICD-10-CM

## 2016-01-09 DIAGNOSIS — R499 Unspecified voice and resonance disorder: Secondary | ICD-10-CM

## 2016-01-09 DIAGNOSIS — H663X1 Other chronic suppurative otitis media, right ear: Secondary | ICD-10-CM | POA: Diagnosis not present

## 2016-01-09 DIAGNOSIS — R05 Cough: Secondary | ICD-10-CM | POA: Diagnosis not present

## 2016-01-09 DIAGNOSIS — R059 Cough, unspecified: Secondary | ICD-10-CM

## 2016-01-09 MED ORDER — LEVOFLOXACIN 500 MG PO TABS: 500.0000 mg | ORAL_TABLET | Freq: Every day | ORAL | Status: DC

## 2016-01-09 MED ORDER — NEOMYCIN-POLYMYXIN-HC 3.5-10000-1 OT SOLN: 3.0000 [drp] | Freq: Four times a day (QID) | OTIC | Status: DC

## 2016-01-09 NOTE — Patient Instructions (Addendum)
You have several problems going on. First of all you have the ear infection.  Second, you have change in her voice which is very likely caused by irritation of the laryngeal nerve which goes to your chest.  The most important finding we got today is that your chest x-ray shows an abnormality in the left upper lobe and in the mediastinum which is exactly where laryngeal nerve travels. I'm concerned that this may be a cancer and so I'm referring him to a specialist in pulmonary medicine.  I'm also having you see an ear nose and throat person because of the year problem.  I prescribed antibiotics both in terms of ear drops as well as pills to try to clear up both the lung problem and the ear problem.

## 2016-01-09 NOTE — Progress Notes (Signed)
   Subjective:    Patient ID: Chad Wang, male    DOB: 07/28/1956, 60 y.o.   MRN: [id number] By signing my name below, I, Chad Wang, attest that this documentation has been prepared under the direction and in the presence of Chad Haber, MD.  Electronically Signed: Zola Wang, Medical Scribe. 01/09/2016. 11:27 AM.  HPI HPI Comments: Chad Wang is a 60 y.o. male accompanied by his mental health case worker who presents to the [facility] complaining of gradual onset, intermittent, productive cough that started about 1 month ago. Patient reports having associated sore throat and voice "grogginess." He has also had ear discharge and decreased hearing from his right ear. He has been taking cough drops and a OTC cough syrup. Patient denies fever, ear pain, and dizziness. He does admit to smoking. His most recent blood sugar was 115. NKDA.    Review of Systems  Constitutional: Negative for fever.  HENT: Positive for ear discharge, hearing loss, sore throat and voice change. Negative for ear pain.   Respiratory: Positive for cough.   Neurological: Negative for dizziness.       Objective:   Physical Exam CONSTITUTIONAL: Well developed/well nourished HEAD: Normocephalic/atraumatic EYES: EOM/PERRL ENMT: Mucous membranes moist. Green/yellow pus with perforation in his right TM. Low-pitched, hollow voice. NECK: supple no meningeal signs SPINE: entire spine nontender CV: S1/S2 noted, no murmurs/rubs/gallops noted LUNGS: Wheezes ABDOMEN: soft, nontender, no rebound or guarding GU: no cva tenderness NEURO: Pt is awake/alert, moves all extremitiesx4 EXTREMITIES: pulses normal, full ROM SKIN: warm, color normal PSYCH: no abnormalities of mood noted  [facility] (PRIMARY) x-ray report read by Dr. Joseph Art: Mass left upper chest.    Assessment & Plan:   This chart was scribed in my presence and reviewed by me personally.    ICD-9-CM ICD-10-CM   1. Other  chronic suppurative otitis media of right ear 382.3 H66.3X1 neomycin-polymyxin-hydrocortisone (CORTISPORIN) otic solution     Ambulatory referral to ENT  2. Cough 786.2 R05 DG Chest 2 View  3. Change in voice 784.49 R49.9 DG Chest 2 View  4. Abnormal chest x-ray 793.2 R93.8 levofloxacin (LEVAQUIN) 500 MG tablet     CT Angio Chest W/Cm &/Or Wo Cm     Ambulatory referral to Pulmonology     Signed, Chad Haber, MD

## 2016-01-18 ENCOUNTER — Encounter (HOSPITAL_COMMUNITY): Payer: Self-pay | Admitting: Family Medicine

## 2016-01-18 ENCOUNTER — Inpatient Hospital Stay (HOSPITAL_COMMUNITY)
Admission: EM | Admit: 2016-01-18 | Discharge: 2016-01-30 | DRG: 166 | Disposition: A | Discharge status: Skilled Nursing Facility | Payer: Medicare Other | Attending: Internal Medicine | Admitting: Internal Medicine

## 2016-01-18 ENCOUNTER — Emergency Department (HOSPITAL_COMMUNITY): Payer: Medicare Other

## 2016-01-18 DIAGNOSIS — M5489 Other dorsalgia: Secondary | ICD-10-CM | POA: Diagnosis not present

## 2016-01-18 DIAGNOSIS — M5440 Lumbago with sciatica, unspecified side: Secondary | ICD-10-CM | POA: Diagnosis not present

## 2016-01-18 DIAGNOSIS — C321 Malignant neoplasm of supraglottis: Secondary | ICD-10-CM | POA: Diagnosis present

## 2016-01-18 DIAGNOSIS — E1100 Type 2 diabetes mellitus with hyperosmolarity without nonketotic hyperglycemic-hyperosmolar coma (NKHHC): Secondary | ICD-10-CM | POA: Diagnosis not present

## 2016-01-18 DIAGNOSIS — J9600 Acute respiratory failure, unspecified whether with hypoxia or hypercapnia: Secondary | ICD-10-CM | POA: Diagnosis present

## 2016-01-18 DIAGNOSIS — J439 Emphysema, unspecified: Secondary | ICD-10-CM | POA: Diagnosis present

## 2016-01-18 DIAGNOSIS — C3412 Malignant neoplasm of upper lobe, left bronchus or lung: Secondary | ICD-10-CM

## 2016-01-18 DIAGNOSIS — E222 Syndrome of inappropriate secretion of antidiuretic hormone: Secondary | ICD-10-CM | POA: Diagnosis present

## 2016-01-18 DIAGNOSIS — Z720 Tobacco use: Secondary | ICD-10-CM | POA: Diagnosis present

## 2016-01-18 DIAGNOSIS — R918 Other nonspecific abnormal finding of lung field: Principal | ICD-10-CM | POA: Diagnosis present

## 2016-01-18 DIAGNOSIS — E871 Hypo-osmolality and hyponatremia: Secondary | ICD-10-CM | POA: Diagnosis not present

## 2016-01-18 DIAGNOSIS — F200 Paranoid schizophrenia: Secondary | ICD-10-CM | POA: Diagnosis present

## 2016-01-18 DIAGNOSIS — F209 Schizophrenia, unspecified: Secondary | ICD-10-CM | POA: Diagnosis present

## 2016-01-18 DIAGNOSIS — M549 Dorsalgia, unspecified: Secondary | ICD-10-CM | POA: Diagnosis present

## 2016-01-18 DIAGNOSIS — J984 Other disorders of lung: Secondary | ICD-10-CM | POA: Diagnosis not present

## 2016-01-18 DIAGNOSIS — E785 Hyperlipidemia, unspecified: Secondary | ICD-10-CM | POA: Diagnosis present

## 2016-01-18 DIAGNOSIS — J910 Malignant pleural effusion: Secondary | ICD-10-CM | POA: Diagnosis present

## 2016-01-18 DIAGNOSIS — Z6822 Body mass index (BMI) 22.0-22.9, adult: Secondary | ICD-10-CM | POA: Diagnosis not present

## 2016-01-18 DIAGNOSIS — R011 Cardiac murmur, unspecified: Secondary | ICD-10-CM | POA: Diagnosis present

## 2016-01-18 DIAGNOSIS — F17200 Nicotine dependence, unspecified, uncomplicated: Secondary | ICD-10-CM | POA: Diagnosis present

## 2016-01-18 DIAGNOSIS — I10 Essential (primary) hypertension: Secondary | ICD-10-CM | POA: Diagnosis present

## 2016-01-18 DIAGNOSIS — E440 Moderate protein-calorie malnutrition: Secondary | ICD-10-CM | POA: Diagnosis present

## 2016-01-18 DIAGNOSIS — K219 Gastro-esophageal reflux disease without esophagitis: Secondary | ICD-10-CM | POA: Diagnosis present

## 2016-01-18 DIAGNOSIS — J392 Other diseases of pharynx: Secondary | ICD-10-CM | POA: Diagnosis present

## 2016-01-18 DIAGNOSIS — E119 Type 2 diabetes mellitus without complications: Secondary | ICD-10-CM

## 2016-01-18 DIAGNOSIS — J387 Other diseases of larynx: Secondary | ICD-10-CM | POA: Diagnosis not present

## 2016-01-18 DIAGNOSIS — T380X5A Adverse effect of glucocorticoids and synthetic analogues, initial encounter: Secondary | ICD-10-CM | POA: Diagnosis present

## 2016-01-18 DIAGNOSIS — R0602 Shortness of breath: Secondary | ICD-10-CM | POA: Diagnosis present

## 2016-01-18 DIAGNOSIS — R000 Tachycardia, unspecified: Secondary | ICD-10-CM | POA: Diagnosis not present

## 2016-01-18 DIAGNOSIS — M5441 Lumbago with sciatica, right side: Secondary | ICD-10-CM | POA: Diagnosis not present

## 2016-01-18 DIAGNOSIS — G8929 Other chronic pain: Secondary | ICD-10-CM | POA: Diagnosis present

## 2016-01-18 DIAGNOSIS — J189 Pneumonia, unspecified organism: Secondary | ICD-10-CM | POA: Diagnosis present

## 2016-01-18 HISTORY — DX: Type 2 diabetes mellitus without complications: E11.9

## 2016-01-18 LAB — BASIC METABOLIC PANEL
Anion gap: 12 (ref 5–15)
BUN: 9 mg/dL (ref 6–20)
CO2: 22 mmol/L (ref 22–32)
Calcium: 9.3 mg/dL (ref 8.9–10.3)
Chloride: 90 mmol/L — ABNORMAL LOW (ref 101–111)
Creatinine, Ser: 0.72 mg/dL (ref 0.61–1.24)
GFR calc Af Amer: >60 mL/min (ref >60)
GFR calc non Af Amer: >60 mL/min (ref >60)
GLUCOSE: 152 mg/dL — AB (ref 65–99)
POTASSIUM: 4.4 mmol/L (ref 3.5–5.1)
Sodium: 124 mmol/L — ABNORMAL LOW (ref 135–145)

## 2016-01-18 LAB — CBC
HCT: 40.3 % (ref 39.0–52.0)
Hemoglobin: 14.5 g/dL (ref 13.0–17.0)
MCH: 28.3 pg (ref 26.0–34.0)
MCHC: 36 g/dL (ref 30.0–36.0)
MCV: 78.7 fL (ref 78.0–100.0)
Platelets: 230 10*3/uL (ref 150–400)
RBC: 5.12 MIL/uL (ref 4.22–5.81)
RDW: 13.3 % (ref 11.5–15.5)
WBC: 10.2 10*3/uL (ref 4.0–10.5)

## 2016-01-18 LAB — BRAIN NATRIURETIC PEPTIDE: B NATRIURETIC PEPTIDE 5: 24.5 pg/mL (ref 0.0–100.0)

## 2016-01-18 LAB — I-STAT TROPONIN, ED: Troponin i, poc: 0.02 ng/mL (ref 0.00–0.08)

## 2016-01-18 MED ORDER — DEXTROSE 5 % IV SOLN
1.0000 g | Freq: Once | INTRAVENOUS | Status: AC
  Administered 2016-01-18: 1 g via INTRAVENOUS
  Filled 2016-01-18: qty 10

## 2016-01-18 MED ORDER — AZITHROMYCIN 250 MG PO TABS
500.0000 mg | ORAL_TABLET | Freq: Once | ORAL | Status: AC
  Administered 2016-01-18: 500 mg via ORAL
  Filled 2016-01-18: qty 2

## 2016-01-18 MED ORDER — SODIUM CHLORIDE 0.9 % IV BOLUS (SEPSIS)
1000.0000 mL | Freq: Once | INTRAVENOUS | Status: AC
  Administered 2016-01-18: 1000 mL via INTRAVENOUS

## 2016-01-18 MED ORDER — IOHEXOL 350 MG/ML SOLN
80.0000 mL | Freq: Once | INTRAVENOUS | Status: AC | PRN
  Administered 2016-01-18: 80 mL via INTRAVENOUS

## 2016-01-18 NOTE — H&P (Signed)
PCP:  No PCP Per Patient    Referring provider Josh PA   Chief Complaint:  Shortness of breath  HPI: Chad Wang is a 60 y.o. male   has a past medical history of Schizophrenia ([place]); Hypertension; Hyperlipemia; GERD (gastroesophageal reflux disease); and Diabetes mellitus without complication ([place]).   Presented with 3 month hx of gradual progressive shortness of breath and cough associated hoarseness he has been seen both by a number of providers and have had a few courses of antibiotics which would improve his shortness of breath for some time but then he would restart having that He presented to urgent care and was treated with Levaquin for presumed pneumonia that time chest x-ray was done which was worrisome for possible Mass. The was planning to have CT scan done. Today he was so short of breath he couldn't walk to the mailbox and presented to emergency department [place] ER: CT of the chest showing no PE, evidence of emphysema, right lung likely pneumonia collapse of the left lower lobe with abrupt termination of the left lower lobe bronchus suspicious for an endotracheal lesion masslike consolidation left upper lung with adjacent pleural effusion also suspicious for mass esophageal dilatation was also noted to have sodium of 124  Regarding pertinent past history: Schizophrenia well controlled on medications history of diabetes extensive smoking history  Hospitalist was called for admission for new diagnosis of lung mass as well as community-acquired pneumonia  Review of Systems:    Pertinent positives include: Progressive shortness of breath fatigue, weight loss    Constitutional:  No weight loss, night sweats, Fevers, chills,HEENT:  No headaches, Difficulty swallowing,Tooth/dental problems,Sore throat,  No sneezing, itching, ear ache, nasal congestion, post nasal drip,  Cardio-vascular:  No chest pain, Orthopnea, PND, anasarca, dizziness, palpitations.no Bilateral lower  extremity swelling  GI:  No heartburn, indigestion, abdominal pain, nausea, vomiting, diarrhea, change in bowel habits, loss of appetite, melena, blood in stool, hematemesis Resp:  no shortness of breath at rest. No dyspnea on exertion, No excess mucus, no productive cough, No non-productive cough, No coughing up of blood.No change in color of mucus.No wheezing. Skin:  no rash or lesions. No jaundice GU:  no dysuria, change in color of urine, no urgency or frequency. No straining to urinate.  No flank pain.  Musculoskeletal:  No joint pain or no joint swelling. No decreased range of motion. No back pain.  Psych:  No change in mood or affect. No depression or anxiety. No memory loss.  Neuro: no localizing neurological complaints, no tingling, no weakness, no double vision, no gait abnormality, no slurred speech, no confusion  Otherwise ROS are negative except for above, 10 systems were reviewed  Past Medical History: Past Medical History  Diagnosis Date  . Schizophrenia ([place])   . Hypertension   . Hyperlipemia   . GERD (gastroesophageal reflux disease)   . Diabetes mellitus without complication [facility])    Past Surgical History  Procedure Laterality Date  . Knee arthroscopy       Medications: Prior to Admission medications   Medication Sig Start Date End Date Taking? Authorizing Provider  amLODipine (NORVASC) 10 MG tablet Take 10 mg by mouth daily.   Yes Historical Provider, MD  cloZAPine (CLOZARIL) 100 MG tablet Take 100 mg by mouth at bedtime.    Yes Historical Provider, MD  gemfibrozil (LOPID) 600 MG tablet Take 600 mg by mouth 2 (two) times daily.   Yes Historical Provider, MD  metFORMIN (GLUCOPHAGE) 1000 MG  tablet Take 500 mg by mouth 2 (two) times daily with a meal.    Yes Historical Provider, MD  levofloxacin (LEVAQUIN) 500 MG tablet Take 1 tablet (500 mg total) by mouth daily. Patient not taking: Reported on 01/18/2016 01/09/16   Robyn Haber, MD  lisinopril  (PRINIVIL,ZESTRIL) 40 MG tablet Take 40 mg by mouth every morning.     Historical Provider, MD  neomycin-polymyxin-hydrocortisone (CORTISPORIN) otic solution Place 3 drops into the right ear 4 (four) times daily. 01/09/16   Robyn Haber, MD  pantoprazole (PROTONIX) 40 MG tablet Take 40 mg by mouth at bedtime.    Historical Provider, MD  pravastatin (PRAVACHOL) 40 MG tablet Take 40 mg by mouth at bedtime.    Historical Provider, MD    Allergies:  No Known Allergies  Social History:  Ambulatory  Independently  Lives at home  With family     reports that he has been smoking.  He does not have any smokeless tobacco history on file. He reports that he drinks alcohol. He reports that he does not use illicit drugs.    Family History: family history includes CAD in his brother and mother; Diabetes in his other; Lung cancer in his father. There is no history of Stroke.    Physical Exam: Patient Vitals for the past 24 hrs:  BP Temp Temp src Pulse Resp SpO2 Height Weight  01/18/16 2203 158/95 mmHg - - 88 21 91 % - -  01/18/16 2045 149/97 mmHg - - 77 23 92 % - -  01/18/16 2031 153/94 mmHg - - 81 20 94 % - -  01/18/16 1928 124/85 mmHg 97.8 F (36.6 C) Oral 82 20 94 % - -  01/18/16 1927 124/85 mmHg - - 82 20 94 % - -  01/18/16 1842 133/95 mmHg - - 88 22 98 % - -  01/18/16 1603 129/94 mmHg 97.6 F (36.4 C) Oral 83 17 96 % 5' 10.5" (1.791 m) 73.936 kg (163 lb)    1. General:  in No Acute distress 2. Psychological: Alert and  Oriented 3. Head/ENT:    Dry Mucous Membranes                          Head Non traumatic, neck supple                          Normal   Dentition 4. SKIN: decreased Skin turgor,  Skin clean Dry and intact no rash 5. Heart: Regular rate and rhythm no Murmur, Rub or gallop 6. Lungs: mild  wheezes some  crackles  On the right 7. Abdomen: Soft, non-tender, Non distended 8. Lower extremities: no clubbing, cyanosis, or edema 9. Neurologically Grossly intact, moving all  4 extremities equally 10. MSK: Normal range of motion  body mass index is 23.05 kg/(m^2).   Labs on Admission:   Results for orders placed or performed during the hospital encounter of 01/18/16 (from the past 24 hour(s))  Basic metabolic panel     Status: Abnormal   Collection Time: 01/18/16  4:33 PM  Result Value Ref Range   Sodium 124 (L) 135 - 145 mmol/L   Potassium 4.4 3.5 - 5.1 mmol/L   Chloride 90 (L) 101 - 111 mmol/L   CO2 22 22 - 32 mmol/L   Glucose, Bld 152 (H) 65 - 99 mg/dL   BUN 9 6 - 20 mg/dL   Creatinine, Ser 0.72  0.61 - 1.24 mg/dL   Calcium 9.3 8.9 - 10.3 mg/dL   GFR calc non Af Amer >60 >60 mL/min   GFR calc Af Amer >60 >60 mL/min   Anion gap 12 5 - 15  CBC     Status: None   Collection Time: 01/18/16  4:33 PM  Result Value Ref Range   WBC 10.2 4.0 - 10.5 K/uL   RBC 5.12 4.22 - 5.81 MIL/uL   Hemoglobin 14.5 13.0 - 17.0 g/dL   HCT 40.3 39.0 - 52.0 %   MCV 78.7 78.0 - 100.0 fL   MCH 28.3 26.0 - 34.0 pg   MCHC 36.0 30.0 - 36.0 g/dL   RDW 13.3 11.5 - 15.5 %   Platelets 230 150 - 400 K/uL  I-stat troponin, ED (not at [facility], [facility])     Status: None   Collection Time: 01/18/16  4:33 PM  Result Value Ref Range   Troponin i, poc 0.02 0.00 - 0.08 ng/mL   Comment 3          Brain natriuretic peptide     Status: None   Collection Time: 01/18/16  4:34 PM  Result Value Ref Range   B Natriuretic Peptide 24.5 0.0 - 100.0 pg/mL    UA  Not obtained  Lab Results  Component Value Date   HGBA1C * 12/25/2008    6.4 (NOTE)   The ADA recommends the following therapeutic goal for glycemic   control related to Hgb A1C measurement:   Goal of Therapy:   < 7.0% Hgb A1C   Reference: American Diabetes Association: Clinical Practice   Recommendations 2008, Diabetes Care,  2008, 31:(Suppl 1).    Estimated Creatinine Clearance: 101.4 mL/min (by C-G formula based on Cr of 0.72).  BNP (last 3 results) No results for input(s): PROBNP in the last 8760 hours.  Other results:  I  have pearsonaly reviewed this: ECG REPORT  Rate:94  Rhythm: Sinus tachycardia  ST&T Change: T wave inversions in lateral leads QTC 407  Filed Weights   01/18/16 1603  Weight: 73.936 kg (163 lb)     Cultures:    Component Value Date/Time   SDES ABSCESS LEG LEFT 02/19/2013 1508   SPECREQUEST NONE 02/19/2013 1508   CULT  02/19/2013 1508    ABUNDANT STAPHYLOCOCCUS AUREUS Note: RIFAMPIN AND GENTAMICIN SHOULD NOT BE USED AS SINGLE DRUGS FOR TREATMENT OF STAPH INFECTIONS.   REPTSTATUS 02/22/2013 FINAL 02/19/2013 1508     Radiological Exams on Admission: Ct Angio Chest Pe W/cm &/or Wo Cm  01/18/2016  CLINICAL DATA:  Cough and shortness of breath for many months. Nodular opacity seen in left upper lung on chest radiograph. EXAM: CT ANGIOGRAPHY CHEST WITH CONTRAST TECHNIQUE: Multidetector CT imaging of the chest was performed using the standard protocol during bolus administration of intravenous contrast. Multiplanar CT image reconstructions and MIPs were obtained to evaluate the vascular anatomy. CONTRAST:  51m OMNIPAQUE IOHEXOL 350 MG/ML SOLN COMPARISON:  Chest 01/09/2016 FINDINGS: Technically adequate study with good opacification of the central and segmental pulmonary arteries. No focal filling defects are demonstrated. No evidence of significant pulmonary embolus. Normal heart size. Mildly dilated esophagus with small air-fluid level. This may be due to dysmotility disorder. Prominent mediastinal lymph nodes with mild enlargement of a right paratracheal lymph node, measuring 16 mm short axis dimension. This is nonspecific in could be reactive or metastatic. There is diffuse emphysematous change throughout the lungs but more prominent in the left lung. Alveolar infiltrates in the right  lung likely represent pneumonia but could indicate asymmetric edema. There is an abrupt termination to the left lower lobe bronchus with complete collapse of the left lower lung. This suggest an endobronchial  obstructing lesion. Bronchoscopy may be useful for further evaluation. Masslike consolidation in the left upper lung with associated left pleural effusion and tenting of the pleura. This measures about 3.8 cm maximal diameter. Appearance is suspicious for a mass lesion although a focal consolidation could potentially have this appearance. No pneumothorax. Included portions of the upper abdominal organs are grossly unremarkable. Degenerative changes in the spine. No destructive bone lesions. Review of the MIP images confirms the above findings. IMPRESSION: 1. No evidence of significant pulmonary embolus. 2. Diffuse emphysematous changes in the lungs. Diffuse alveolar infiltration in the right lung is probably due to pneumonia but asymmetric edema could also have this appearance. 3. Collapse of the left lower lung with abrupt termination of the left lower lobe bronchus, suspicious for endobronchial lesion. Consider bronchoscopy for further evaluation. 4. Masslike consolidation in the left upper lung with adjacent pleural effusion and tenting. This is highly suspicious for a mass lesion. 5. Esophageal dilatation likely due to dysmotility although distal stricture not excluded. Electronically Signed   By: Lucienne Capers M.D.   On: 01/18/2016 21:27    Chart has been reviewed  Family  at  Bedside  plan of care was discussed with  Kae Heller [phone] home, [phone] cell,  Hubert Azure [phone] cell [phone] home  Assessment/Plan  60 year old gentleman with history of schizophrenia and tobacco abuse with persistent cough and shortness of breath was found to have new lung masses one endobronchial and one at left epices as well as diffuse disease of right lung worrisome for atypical pneumonia  Present on Admission:  . Lung mass discussed. [facility] who recommended initiation of steroids to help with endobronchial swelling. We'll make nothing by mouth for possible bronchoscopy tomorrow. We'll  need father staging later time including brain MRI and outpatient PET scan  . Community acquired pneumonia administer Rocephin and azithromycin will obtain sputum culture  . 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 Hypertension in home medication  . Tobacco abuse discussed importance of quitting    Prophylaxis: SCD  and need bronchoscopy and biopsy  CODE STATUS:  FULL CODE  as per patient    Disposition:   To home once workup is complete and patient is stable  Other plan as per orders.  I have spent a total of 67 min on this admission  extra time was spent to discuss case with pulmonology  [place] 01/18/2016, 11:26 PM  [facility]  Pager [phone]   after 2 AM please page floor coverage PA If 7AM-7PM, please contact the day team taking care of the patient  [email or web address]  Password [email or web address]

## 2016-01-18 NOTE — ED Notes (Signed)
Pt here for increased SOB over the past weeks. sts had a questionable x ray a week ago that had something worrisome. sts treated for PNA and not better.

## 2016-01-18 NOTE — ED Notes (Signed)
Placed pt on 2L O2 via [place] after pt returned SOB after ambulating to & from the bathroom.

## 2016-01-18 NOTE — ED Notes (Signed)
Patient transported to CT 

## 2016-01-18 NOTE — ED Provider Notes (Signed)
CSN: [id number]     Arrival date & time 01/18/16  1552 History   First MD Initiated Contact with Patient 01/18/16 1924     Chief Complaint  Patient presents with  . Shortness of Breath     (Consider location/radiation/quality/duration/timing/severity/associated sxs/prior Treatment) HPI Comments: Patient with history of schizophrenia on clozapine, diabetes, smoking history -- presents with worsening shortness of breath since last November. Shortness of breath is worse with exertion. Patient had difficulty walking to the mailbox today which prompted ED visit. Patient was seen by a primary care physician on 3/17 and had an abnormal x-ray at that time showing left upper lobe mass. Patient was referred for a chest CT which has not yet been performed. Patient has had change in his voice starting around the time of the Super Bowl and has a very hoarse and deep voice which is not typical for him. Patient has chronic right knee pain but otherwise no leg swelling or tenderness. No difficulty with vision. No swelling or fullness in the head or neck. No headache, chest pains, abdominal pain, vomiting, diarrhea. No back pain or extremity pain. Onset of symptoms gradual. Course is worsening. Nothing makes symptoms better or worse.  The history is provided by the patient.    Past Medical History  Diagnosis Date  . Schizophrenia ([place])   . Hypertension   . Hyperlipemia   . GERD (gastroesophageal reflux disease)   . Diabetes mellitus without complication [facility])    Past Surgical History  Procedure Laterality Date  . Knee arthroscopy     History reviewed. No pertinent family history. Social History  Substance Use Topics  . Smoking status: Current Every Day Smoker  . Smokeless tobacco: None  . Alcohol Use: Yes    Review of Systems  Constitutional: Negative for fever.  HENT: Positive for voice change. Negative for rhinorrhea and sore throat.   Eyes: Negative for redness.  Respiratory: Positive for  cough and shortness of breath.   Cardiovascular: Negative for chest pain.  Gastrointestinal: Negative for nausea, vomiting, abdominal pain and diarrhea.  Genitourinary: Negative for dysuria.  Musculoskeletal: Negative for myalgias.  Skin: Negative for rash.  Neurological: Negative for headaches.      Allergies  Review of patient's allergies indicates no known allergies.  Home Medications   Prior to Admission medications   Medication Sig Start Date End Date Taking? Authorizing Provider  amLODipine (NORVASC) 10 MG tablet Take 10 mg by mouth daily.   Yes Historical Provider, MD  cloZAPine (CLOZARIL) 100 MG tablet Take 100 mg by mouth at bedtime.    Yes Historical Provider, MD  gemfibrozil (LOPID) 600 MG tablet Take 600 mg by mouth 2 (two) times daily.    Historical Provider, MD  levofloxacin (LEVAQUIN) 500 MG tablet Take 1 tablet (500 mg total) by mouth daily. 01/09/16   Robyn Haber, MD  lisinopril (PRINIVIL,ZESTRIL) 40 MG tablet Take 40 mg by mouth every morning.     Historical Provider, MD  metFORMIN (GLUCOPHAGE) 1000 MG tablet Take 1,000 mg by mouth 2 (two) times daily with a meal.    Historical Provider, MD  neomycin-polymyxin-hydrocortisone (CORTISPORIN) otic solution Place 3 drops into the right ear 4 (four) times daily. 01/09/16   Robyn Haber, MD  pantoprazole (PROTONIX) 40 MG tablet Take 40 mg by mouth at bedtime.    Historical Provider, MD  pravastatin (PRAVACHOL) 40 MG tablet Take 40 mg by mouth at bedtime.    Historical Provider, MD   BP 124/85 mmHg  Pulse  82  Temp(Src) 97.8 F (36.6 C) (Oral)  Resp 20  Ht 5' 10.5" (1.791 m)  Wt 73.936 kg  BMI 23.05 kg/m2  SpO2 94%   Physical Exam  Constitutional: He appears well-developed and well-nourished.  HENT:  Head: Normocephalic and atraumatic.  Mouth/Throat: Oropharynx is clear and moist.  Abnormal phonation: deep, hoarse  Eyes: Conjunctivae are normal. Right eye exhibits no discharge. Left eye exhibits no discharge.   Neck: Normal range of motion. Neck supple.  Cardiovascular: Normal rate, regular rhythm and normal heart sounds.   Pulmonary/Chest: Effort normal and breath sounds normal. No respiratory distress. He has no wheezes. He has no rales.  Abdominal: Soft. There is no tenderness.  Musculoskeletal: He exhibits no edema or tenderness.  Lymphadenopathy:       Head (right side): No submental, no submandibular, no tonsillar, no preauricular, no posterior auricular and no occipital adenopathy present.       Head (left side): No submental, no submandibular, no tonsillar, no preauricular, no posterior auricular and no occipital adenopathy present.       Right: No supraclavicular adenopathy present.       Left: No supraclavicular adenopathy present.  Neurological: He is alert.  Skin: Skin is warm and dry.  Psychiatric: He has a normal mood and affect.  Nursing note and vitals reviewed.   ED Course  Procedures (including critical care time) Labs Review Labs Reviewed  BASIC METABOLIC PANEL - Abnormal; Notable for the following:    Sodium 124 (*)    Chloride 90 (*)    Glucose, Bld 152 (*)    All other components within normal limits  CBC  BRAIN NATRIURETIC PEPTIDE  I-STAT TROPOININ, ED    Imaging Review Ct Angio Chest Pe W/cm &/or Wo Cm  01/18/2016  CLINICAL DATA:  Cough and shortness of breath for many months. Nodular opacity seen in left upper lung on chest radiograph. EXAM: CT ANGIOGRAPHY CHEST WITH CONTRAST TECHNIQUE: Multidetector CT imaging of the chest was performed using the standard protocol during bolus administration of intravenous contrast. Multiplanar CT image reconstructions and MIPs were obtained to evaluate the vascular anatomy. CONTRAST:  38m OMNIPAQUE IOHEXOL 350 MG/ML SOLN COMPARISON:  Chest 01/09/2016 FINDINGS: Technically adequate study with good opacification of the central and segmental pulmonary arteries. No focal filling defects are demonstrated. No evidence of significant  pulmonary embolus. Normal heart size. Mildly dilated esophagus with small air-fluid level. This may be due to dysmotility disorder. Prominent mediastinal lymph nodes with mild enlargement of a right paratracheal lymph node, measuring 16 mm short axis dimension. This is nonspecific in could be reactive or metastatic. There is diffuse emphysematous change throughout the lungs but more prominent in the left lung. Alveolar infiltrates in the right lung likely represent pneumonia but could indicate asymmetric edema. There is an abrupt termination to the left lower lobe bronchus with complete collapse of the left lower lung. This suggest an endobronchial obstructing lesion. Bronchoscopy may be useful for further evaluation. Masslike consolidation in the left upper lung with associated left pleural effusion and tenting of the pleura. This measures about 3.8 cm maximal diameter. Appearance is suspicious for a mass lesion although a focal consolidation could potentially have this appearance. No pneumothorax. Included portions of the upper abdominal organs are grossly unremarkable. Degenerative changes in the spine. No destructive bone lesions. Review of the MIP images confirms the above findings. IMPRESSION: 1. No evidence of significant pulmonary embolus. 2. Diffuse emphysematous changes in the lungs. Diffuse alveolar infiltration in the  right lung is probably due to pneumonia but asymmetric edema could also have this appearance. 3. Collapse of the left lower lung with abrupt termination of the left lower lobe bronchus, suspicious for endobronchial lesion. Consider bronchoscopy for further evaluation. 4. Masslike consolidation in the left upper lung with adjacent pleural effusion and tenting. This is highly suspicious for a mass lesion. 5. Esophageal dilatation likely due to dysmotility although distal stricture not excluded. Electronically Signed   By: Lucienne Capers M.D.   On: 01/18/2016 21:27   I have personally  reviewed and evaluated these images and lab results as part of my medical decision-making.   EKG Interpretation   Date/Time:  Wednesday January 18 2016 16:01:57 EDT Ventricular Rate:  95 PR Interval:  126 QRS Duration: 100 QT Interval:  324 QTC Calculation: 407 R Axis:   75 Text Interpretation:  Normal sinus rhythm Possible Left atrial enlargement  Left ventricular hypertrophy T wave abnormality, consider lateral ischemia  , new since last tracing Abnormal ECG Confirmed by KNAPP  MD-J, JON  ([id number]) on 01/18/2016 7:20:09 PM       8:08 PM Patient seen and examined. Work-up initiated. Discussed with Dr. Tomi Bamberger. 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 CT pending.   Vital signs reviewed and are as follows: BP 124/85 mmHg  Pulse 82  Temp(Src) 97.8 F (36.6 C) (Oral)  Resp 20  Ht 5' 10.5" (1.791 m)  Wt 73.936 kg  BMI 23.05 kg/m2  SpO2 94%  10:21 PM Patient and family updated on results. He agrees with admission.   Discussed with Dr. Roel Cluck who will admit.   MDM   Final diagnoses:  Lung mass  Community acquired pneumonia  Hyponatremia   Pt with lung mass, suspicious for CA, also SIADH. Probable R-sided CAP as well. Low-normal O2 sat. Admit.    Carlisle Cater, PA-C 01/18/16 [place], MD 01/18/16 [phone]

## 2016-01-19 ENCOUNTER — Encounter (HOSPITAL_COMMUNITY): Payer: Self-pay

## 2016-01-19 ENCOUNTER — Encounter (HOSPITAL_COMMUNITY)
Admission: EM | Disposition: A | Discharge status: Skilled Nursing Facility | Payer: Self-pay | Source: Home / Self Care | Attending: Internal Medicine

## 2016-01-19 DIAGNOSIS — R918 Other nonspecific abnormal finding of lung field: Principal | ICD-10-CM

## 2016-01-19 DIAGNOSIS — E440 Moderate protein-calorie malnutrition: Secondary | ICD-10-CM

## 2016-01-19 DIAGNOSIS — J439 Emphysema, unspecified: Secondary | ICD-10-CM

## 2016-01-19 DIAGNOSIS — C3412 Malignant neoplasm of upper lobe, left bronchus or lung: Secondary | ICD-10-CM

## 2016-01-19 DIAGNOSIS — I10 Essential (primary) hypertension: Secondary | ICD-10-CM | POA: Diagnosis present

## 2016-01-19 LAB — COMPREHENSIVE METABOLIC PANEL
ALBUMIN: 3.5 g/dL (ref 3.5–5.0)
ALK PHOS: 75 U/L (ref 38–126)
ALT: 12 U/L — ABNORMAL LOW (ref 17–63)
ANION GAP: 12 (ref 5–15)
AST: 18 U/L (ref 15–41)
BUN: 6 mg/dL (ref 6–20)
CALCIUM: 9.4 mg/dL (ref 8.9–10.3)
CHLORIDE: 95 mmol/L — AB (ref 101–111)
CO2: 23 mmol/L (ref 22–32)
Creatinine, Ser: 0.75 mg/dL (ref 0.61–1.24)
GFR calc Af Amer: >60 mL/min (ref >60)
GFR calc non Af Amer: >60 mL/min (ref >60)
GLUCOSE: 146 mg/dL — AB (ref 65–99)
Potassium: 4.6 mmol/L (ref 3.5–5.1)
SODIUM: 130 mmol/L — AB (ref 135–145)
Total Bilirubin: 0.5 mg/dL (ref 0.3–1.2)
Total Protein: 6.5 g/dL (ref 6.5–8.1)

## 2016-01-19 LAB — CREATININE, URINE, RANDOM: Creatinine, Urine: 16.7 mg/dL

## 2016-01-19 LAB — EXPECTORATED SPUTUM ASSESSMENT W REFEX TO RESP CULTURE: SPECIAL REQUESTS: NORMAL

## 2016-01-19 LAB — SODIUM, URINE, RANDOM: Sodium, Ur: 17 mmol/L

## 2016-01-19 LAB — HIV ANTIBODY (ROUTINE TESTING W REFLEX): HIV Screen 4th Generation wRfx: NONREACTIVE

## 2016-01-19 LAB — GLUCOSE, CAPILLARY
GLUCOSE-CAPILLARY: 107 mg/dL — AB (ref 65–99)
GLUCOSE-CAPILLARY: 113 mg/dL — AB (ref 65–99)
GLUCOSE-CAPILLARY: 147 mg/dL — AB (ref 65–99)
GLUCOSE-CAPILLARY: 221 mg/dL — AB (ref 65–99)
GLUCOSE-CAPILLARY: 145 mg/dL — AB (ref 65–99)
GLUCOSE-CAPILLARY: 192 mg/dL — AB (ref 65–99)

## 2016-01-19 LAB — CBC WITH DIFFERENTIAL/PLATELET
BASOS PCT: 0 %
Basophils Absolute: 0 10*3/uL (ref 0.0–0.1)
Eosinophils Absolute: 0 10*3/uL (ref 0.0–0.7)
Eosinophils Relative: 0 %
HEMATOCRIT: 42.4 % (ref 39.0–52.0)
HEMOGLOBIN: 15.4 g/dL (ref 13.0–17.0)
LYMPHS ABS: 0.7 10*3/uL (ref 0.7–4.0)
LYMPHS PCT: 6 %
MCH: 28.8 pg (ref 26.0–34.0)
MCHC: 36.3 g/dL — AB (ref 30.0–36.0)
MCV: 79.3 fL (ref 78.0–100.0)
MONO ABS: 0.2 10*3/uL (ref 0.1–1.0)
MONOS PCT: 2 %
NEUTROS ABS: 9.4 10*3/uL — AB (ref 1.7–7.7)
NEUTROS PCT: 92 %
Platelets: 226 10*3/uL (ref 150–400)
RBC: 5.35 MIL/uL (ref 4.22–5.81)
RDW: 13.7 % (ref 11.5–15.5)
WBC: 10.3 10*3/uL (ref 4.0–10.5)

## 2016-01-19 LAB — OSMOLALITY, URINE: Osmolality, Ur: 132 mOsm/kg — ABNORMAL LOW (ref 300–900)

## 2016-01-19 LAB — STREP PNEUMONIAE URINARY ANTIGEN: STREP PNEUMO URINARY ANTIGEN: NEGATIVE

## 2016-01-19 SURGERY — VIDEO BRONCHOSCOPY WITHOUT FLUORO
Anesthesia: Moderate Sedation | Laterality: Bilateral

## 2016-01-19 MED ORDER — AMLODIPINE BESYLATE 10 MG PO TABS
10.0000 mg | ORAL_TABLET | Freq: Every day | ORAL | Status: DC
Start: 2016-01-19 — End: 2016-01-30
  Administered 2016-01-19 – 2016-01-30 (×12): 10 mg via ORAL
  Filled 2016-01-19 (×12): qty 1

## 2016-01-19 MED ORDER — INSULIN ASPART 100 UNIT/ML ~~LOC~~ SOLN
0.0000 [IU] | SUBCUTANEOUS | Status: DC
  Administered 2016-01-19: 3 [IU] via SUBCUTANEOUS
  Administered 2016-01-19: 2 [IU] via SUBCUTANEOUS
  Administered 2016-01-19 (×2): 1 [IU] via SUBCUTANEOUS
  Administered 2016-01-20: 2 [IU] via SUBCUTANEOUS
  Administered 2016-01-20: 3 [IU] via SUBCUTANEOUS
  Administered 2016-01-20: 2 [IU] via SUBCUTANEOUS
  Administered 2016-01-20: 1 [IU] via SUBCUTANEOUS
  Administered 2016-01-20: 5 [IU] via SUBCUTANEOUS
  Administered 2016-01-21: 3 [IU] via SUBCUTANEOUS
  Administered 2016-01-21: 2 [IU] via SUBCUTANEOUS
  Administered 2016-01-21: 3 [IU] via SUBCUTANEOUS
  Administered 2016-01-21 (×2): 1 [IU] via SUBCUTANEOUS
  Administered 2016-01-21: 2 [IU] via SUBCUTANEOUS
  Administered 2016-01-22: 3 [IU] via SUBCUTANEOUS
  Administered 2016-01-22: 5 [IU] via SUBCUTANEOUS
  Administered 2016-01-22: 2 [IU] via SUBCUTANEOUS
  Administered 2016-01-22 (×2): 3 [IU] via SUBCUTANEOUS
  Administered 2016-01-23: 2 [IU] via SUBCUTANEOUS
  Administered 2016-01-23 – 2016-01-24 (×2): 5 [IU] via SUBCUTANEOUS
  Administered 2016-01-24: 2 [IU] via SUBCUTANEOUS
  Administered 2016-01-25: 3 [IU] via SUBCUTANEOUS
  Administered 2016-01-25: 5 [IU] via SUBCUTANEOUS
  Administered 2016-01-26 (×2): 3 [IU] via SUBCUTANEOUS
  Administered 2016-01-26 (×2): 5 [IU] via SUBCUTANEOUS
  Administered 2016-01-27 (×3): 2 [IU] via SUBCUTANEOUS
  Administered 2016-01-27 (×2): 3 [IU] via SUBCUTANEOUS
  Administered 2016-01-28: 5 [IU] via SUBCUTANEOUS
  Administered 2016-01-28 (×2): 2 [IU] via SUBCUTANEOUS

## 2016-01-19 MED ORDER — DIVALPROEX SODIUM 500 MG PO DR TAB: 500.0000 mg | DELAYED_RELEASE_TABLET | Freq: Two times a day (BID) | ORAL | Status: DC

## 2016-01-19 MED ORDER — DEXTROSE 5 % IV SOLN
1.0000 g | INTRAVENOUS | Status: DC
  Administered 2016-01-19 – 2016-01-21 (×3): 1 g via INTRAVENOUS
  Filled 2016-01-19 (×4): qty 10

## 2016-01-19 MED ORDER — IPRATROPIUM-ALBUTEROL 0.5-2.5 (3) MG/3ML IN SOLN
3.0000 mL | Freq: Four times a day (QID) | RESPIRATORY_TRACT | Status: DC
  Administered 2016-01-19 – 2016-01-20 (×2): 3 mL via RESPIRATORY_TRACT
  Filled 2016-01-19 (×2): qty 3

## 2016-01-19 MED ORDER — CLOZAPINE 100 MG PO TABS
100.0000 mg | ORAL_TABLET | Freq: Every day | ORAL | Status: DC
  Administered 2016-01-19 – 2016-01-29 (×11): 100 mg via ORAL
  Filled 2016-01-19 (×11): qty 1

## 2016-01-19 MED ORDER — GLUCERNA SHAKE PO LIQD
237.0000 mL | Freq: Two times a day (BID) | ORAL | Status: DC
  Administered 2016-01-19 – 2016-01-30 (×20): 237 mL via ORAL

## 2016-01-19 MED ORDER — IPRATROPIUM BROMIDE 0.02 % IN SOLN
0.5000 mg | Freq: Four times a day (QID) | RESPIRATORY_TRACT | Status: DC
  Administered 2016-01-19 (×2): 0.5 mg via RESPIRATORY_TRACT
  Filled 2016-01-19 (×2): qty 2.5

## 2016-01-19 MED ORDER — DEXTROSE 5 % IV SOLN
500.0000 mg | INTRAVENOUS | Status: DC
  Administered 2016-01-19: 500 mg via INTRAVENOUS
  Filled 2016-01-19 (×2): qty 500

## 2016-01-19 MED ORDER — CETYLPYRIDINIUM CHLORIDE 0.05 % MT LIQD
7.0000 mL | Freq: Two times a day (BID) | OROMUCOSAL | Status: DC
  Administered 2016-01-19 – 2016-01-30 (×22): 7 mL via OROMUCOSAL

## 2016-01-19 MED ORDER — METHYLPREDNISOLONE SODIUM SUCC 40 MG IJ SOLR
40.0000 mg | Freq: Two times a day (BID) | INTRAMUSCULAR | Status: DC
  Administered 2016-01-19 – 2016-01-21 (×7): 40 mg via INTRAVENOUS
  Filled 2016-01-19 (×7): qty 1

## 2016-01-19 MED ORDER — DIVALPROEX SODIUM 500 MG PO DR TAB
500.0000 mg | DELAYED_RELEASE_TABLET | Freq: Every day | ORAL | Status: DC
  Administered 2016-01-19 – 2016-01-30 (×12): 500 mg via ORAL
  Filled 2016-01-19 (×23): qty 1

## 2016-01-19 MED ORDER — ALBUTEROL SULFATE (2.5 MG/3ML) 0.083% IN NEBU
2.5000 mg | INHALATION_SOLUTION | RESPIRATORY_TRACT | Status: DC | PRN
  Administered 2016-01-23 – 2016-01-27 (×2): 2.5 mg via RESPIRATORY_TRACT
  Filled 2016-01-19 (×2): qty 3

## 2016-01-19 MED ORDER — DIVALPROEX SODIUM 500 MG PO DR TAB
1000.0000 mg | DELAYED_RELEASE_TABLET | Freq: Every day | ORAL | Status: DC
  Administered 2016-01-19 – 2016-01-29 (×12): 1000 mg via ORAL
  Filled 2016-01-19 (×12): qty 2

## 2016-01-19 MED ORDER — PANTOPRAZOLE SODIUM 40 MG PO TBEC
40.0000 mg | DELAYED_RELEASE_TABLET | Freq: Every day | ORAL | Status: DC
  Administered 2016-01-19 – 2016-01-30 (×12): 40 mg via ORAL
  Filled 2016-01-19 (×12): qty 1

## 2016-01-19 MED ORDER — GEMFIBROZIL 600 MG PO TABS
600.0000 mg | ORAL_TABLET | Freq: Two times a day (BID) | ORAL | Status: DC
  Administered 2016-01-19 – 2016-01-30 (×20): 600 mg via ORAL
  Filled 2016-01-19 (×24): qty 1

## 2016-01-19 MED ORDER — NICOTINE 21 MG/24HR TD PT24
21.0000 mg | MEDICATED_PATCH | Freq: Every day | TRANSDERMAL | Status: DC
  Administered 2016-01-19 – 2016-01-26 (×8): 21 mg via TRANSDERMAL
  Filled 2016-01-19 (×9): qty 1

## 2016-01-19 NOTE — Progress Notes (Addendum)
D/w Dr Elsworth Soho - pccm who did consult. He is post call and so I plan to do bronch . On schedule for 3.15-3.30pm 01/19/2016 .Will meet patient sometime before that. This is  FYI note,Maintain npo  Dr. Brand Males, M.D., [facility].C.P Pulmonary and Critical Care Medicine Staff Physician [facility] Pager: [phone], If no answer or between  15:00h - 7:00h: call [phone]  01/19/2016 8:14 AM

## 2016-01-19 NOTE — Consult Note (Signed)
Name: Chad Wang MRN: [id number] DOB: 01/23/1956    ADMISSION DATE:  01/18/2016 CONSULTATION DATE:  01/18/16  REFERRING MD :  Roel Cluck  CHIEF COMPLAINT:  SOB   HISTORY OF PRESENT ILLNESS:  Chad Wang is a 60 y.o. male with a PMH as outlined below.  He presented to [facility] ED 03/22 with SOB and cough that has gradually worsened.  Symptoms started roughly 3 months ago and have persisted.  He has seen several providers and has had a few course of abx which would help temporarily, but symptoms would return shortly thereafter.  He also reports that roughly around time of symptom onset, he began to experience hoarseness / change in voice.  Over the past 3 - 4 weeks, he has had some weight loss, unsure exactly how much but states it hasn't been too much.  He does workout most days so isn't sure whether weight loss is attributed to working out or not.  He states he has certainly had decreased appetite and usually only eats 1 meal a day due to easy satiety.  He has not had any hemoptysis or noticed any swollen lymph nodes, but he has noticed increase in fatigue and generalized weakness.  He is a long time smoker, roughly 40 pack year history.  He states he quit smoking 1 week ago and has been using nicotine patch since then which has seemed to work fairly well.  In ED, he had CTA of the chest which was negative for PE but did reveal collapse of LLL with abrupt termination of LLL bronchus suspicious for endobronchial lesion.  He also had masslike consolidation of the LUL with adjacent pleural effusion and tenting, highly suspicious for mass lesion.  In addition. He had diffuse emphysematous changes.  He was admitted by [facility] and PCCM was called for further evaluation / consideration bronchoscopy.  PAST MEDICAL HISTORY :   has a past medical history of Schizophrenia ([place]); Hypertension; Hyperlipemia; GERD (gastroesophageal reflux disease); and Diabetes mellitus without complication ([place]).  has past  surgical history that includes Knee arthroscopy. Prior to Admission medications   Medication Sig Start Date End Date Taking? Authorizing Provider  amLODipine (NORVASC) 10 MG tablet Take 10 mg by mouth daily.   Yes Historical Provider, MD  cloZAPine (CLOZARIL) 100 MG tablet Take 100 mg by mouth at bedtime.    Yes Historical Provider, MD  divalproex (DEPAKOTE) 500 MG DR tablet Take 500-1,000 mg by mouth 2 (two) times daily. Takes '500mg'$  in am and '1000mg'$  in pm   Yes Historical Provider, MD  gemfibrozil (LOPID) 600 MG tablet Take 600 mg by mouth 2 (two) times daily.   Yes Historical Provider, MD  metFORMIN (GLUCOPHAGE) 1000 MG tablet Take 500 mg by mouth 2 (two) times daily with a meal.    Yes Historical Provider, MD  omeprazole (PRILOSEC) 20 MG capsule Take 20 mg by mouth daily.   Yes Historical Provider, MD  levofloxacin (LEVAQUIN) 500 MG tablet Take 1 tablet (500 mg total) by mouth daily. Patient not taking: Reported on 01/18/2016 01/09/16   Robyn Haber, MD  neomycin-polymyxin-hydrocortisone (CORTISPORIN) otic solution Place 3 drops into the right ear 4 (four) times daily. 01/09/16   Robyn Haber, MD   No Known Allergies  FAMILY HISTORY:  family history includes CAD in his brother and mother; Diabetes in his other; Lung cancer in his father. There is no history of Stroke. SOCIAL HISTORY:  reports that he has been smoking.  He does not have any smokeless  tobacco history on file. He reports that he drinks alcohol. He reports that he does not use illicit drugs.  REVIEW OF SYSTEMS:   All negative; except for those that are bolded, which indicate positives.  Constitutional: weight loss, weight gain, night sweats, fevers, chills, fatigue, weakness, hoarseness. HEENT: headaches, sore throat, sneezing, nasal congestion, post nasal drip, difficulty swallowing, tooth/dental problems, visual complaints, visual changes, ear aches. Neuro: difficulty with speech, weakness, numbness, ataxia. CV:  chest  pain, orthopnea, PND, swelling in lower extremities, dizziness, palpitations, syncope.  Resp: cough, hemoptysis, dyspnea, wheezing. GI  heartburn, indigestion, abdominal pain, nausea, vomiting, diarrhea, constipation, change in bowel habits, loss of appetite, hematemesis, melena, hematochezia.  GU: dysuria, change in color of urine, urgency or frequency, flank pain, hematuria. MSK: joint pain or swelling, decreased range of motion. Psych: change in mood or affect, depression, anxiety, suicidal ideations, homicidal ideations. Skin: rash, itching, bruising.    SUBJECTIVE: SOB improved at the moment.  Denies chest pain.  VITAL SIGNS: Temp:  [97.6 F (36.4 C)-97.8 F (36.6 C)] 97.8 F (36.6 C) (03/22 1928) Pulse Rate:  [77-92] 84 (03/22 2245) Resp:  [17-25] 22 (03/22 2245) BP: (124-158)/(85-103) 145/94 mmHg (03/22 2245) SpO2:  [91 %-98 %] 96 % (03/22 2245) Weight:  [73.936 kg (163 lb)] 73.936 kg (163 lb) (03/22 1603)  PHYSICAL EXAMINATION: General: Middle aged male, resting in bed, in NAD. Hoarse voice. Neuro: A&O x 3, non-focal.  HEENT: [place]/AT. PERRL, sclerae anicteric. Cardiovascular: RRR, no M/R/G.  Lungs: Respirations even and unlabored.  Diminished on left, otherwise clear. Abdomen: BS x 4, soft, NT/ND.  Musculoskeletal: No gross deformities, no edema.  Skin: Intact, warm, no rashes.    Recent Labs Lab 01/18/16 1633  NA 124*  K 4.4  CL 90*  CO2 22  BUN 9  CREATININE 0.72  GLUCOSE 152*    Recent Labs Lab 01/18/16 1633  HGB 14.5  HCT 40.3  WBC 10.2  PLT 230   Ct Angio Chest Pe W/cm &/or Wo Cm  01/18/2016  CLINICAL DATA:  Cough and shortness of breath for many months. Nodular opacity seen in left upper lung on chest radiograph. EXAM: CT ANGIOGRAPHY CHEST WITH CONTRAST TECHNIQUE: Multidetector CT imaging of the chest was performed using the standard protocol during bolus administration of intravenous contrast. Multiplanar CT image reconstructions and MIPs were  obtained to evaluate the vascular anatomy. CONTRAST:  45m OMNIPAQUE IOHEXOL 350 MG/ML SOLN COMPARISON:  Chest 01/09/2016 FINDINGS: Technically adequate study with good opacification of the central and segmental pulmonary arteries. No focal filling defects are demonstrated. No evidence of significant pulmonary embolus. Normal heart size. Mildly dilated esophagus with small air-fluid level. This may be due to dysmotility disorder. Prominent mediastinal lymph nodes with mild enlargement of a right paratracheal lymph node, measuring 16 mm short axis dimension. This is nonspecific in could be reactive or metastatic. There is diffuse emphysematous change throughout the lungs but more prominent in the left lung. Alveolar infiltrates in the right lung likely represent pneumonia but could indicate asymmetric edema. There is an abrupt termination to the left lower lobe bronchus with complete collapse of the left lower lung. This suggest an endobronchial obstructing lesion. Bronchoscopy may be useful for further evaluation. Masslike consolidation in the left upper lung with associated left pleural effusion and tenting of the pleura. This measures about 3.8 cm maximal diameter. Appearance is suspicious for a mass lesion although a focal consolidation could potentially have this appearance. No pneumothorax. Included portions of the upper abdominal  organs are grossly unremarkable. Degenerative changes in the spine. No destructive bone lesions. Review of the MIP images confirms the above findings. IMPRESSION: 1. No evidence of significant pulmonary embolus. 2. Diffuse emphysematous changes in the lungs. Diffuse alveolar infiltration in the right lung is probably due to pneumonia but asymmetric edema could also have this appearance. 3. Collapse of the left lower lung with abrupt termination of the left lower lobe bronchus, suspicious for endobronchial lesion. Consider bronchoscopy for further evaluation. 4. Masslike consolidation  in the left upper lung with adjacent pleural effusion and tenting. This is highly suspicious for a mass lesion. 5. Esophageal dilatation likely due to dysmotility although distal stricture not excluded. Electronically Signed   By: Lucienne Capers M.D.   On: 01/18/2016 21:27    STUDIES:  CTA chest 03/22 > negative for PE.  Collapse of LLL with abrupt termination of LLL bronchus suspicious for endobronchial lesion.  Masslike consolidation of the LUL with adjacent pleural effusion and tenting, highly suspicious for mass lesion.  In addition, diffuse emphysematous changes.  SIGNIFICANT EVENTS  03/22 > admitted for further evaluation of presumed lung mass.  ASSESSMENT / PLAN:  Presumed LLL endobronchial lesion with additional mass lesion and probable malignant effusion. Probable CAP. Dyspnea - due to above. Tobacco use disorder. Plan: Keep NPO after midnight. Will attempt to schedule bronchoscopy in AM 03/23 for tissue sampling (pending available physician / time slots, etc.  If not 03/23, then plan for 03/24). Agree that needs further imaging for staging purposes. Continue empiric CAP coverage. Supplemental O2 as needed to maintain SpO2 > 92%. Tobacco cessation counseling.  Rest per primary team.   Montey Hora, [place] [facility] [place] Pulmonary & Critical Care Medicine Pager: [phone]  or [phone] 01/19/2016, 12:21 AM

## 2016-01-19 NOTE — Progress Notes (Addendum)
  Went to consent patient but he jus ate a [place] sandwich. Reviewed orders and the NPO order seems to have been changed to diet. So bronch cancled for 01/19/2016. Bronch shite and Dr Doyle Askew informed. Wil be NPO from 01/20/16 0:01 minute and bronch suite to coordinate with Dr Elsworth Soho about time. I   Update alva will bronch 7.30am 01/20/16    Dr. Brand Males, M.D., F.C.C.P Pulmonary and Critical Care Medicine Staff Physician [place] [facility] and Critical Care Pager: [phone], If no answer or between  15:00h - 7:00h: call [phone]  01/19/2016 11:39 AM

## 2016-01-19 NOTE — Progress Notes (Signed)
Called ER RN for report. Room ready.  

## 2016-01-19 NOTE — Progress Notes (Signed)
Initial Nutrition Assessment  DOCUMENTATION CODES:   Non-severe (moderate) malnutrition in context of chronic illness  INTERVENTION:  Provide Glucerna Shake po BID, each supplement provides 220 kcal and 10 grams of protein.  Encourage adequate PO intake.   NUTRITION DIAGNOSIS:   Inadequate oral intake related to poor appetite as evidenced by per patient/family report.  GOAL:   Patient will meet greater than or equal to 90% of their needs  MONITOR:   PO intake, Supplement acceptance, Weight trends, Labs, I & O's  REASON FOR ASSESSMENT:   Malnutrition Screening Tool    ASSESSMENT:   60 year old gentleman with history of schizophrenia and tobacco abuse with persistent cough and shortness of breath was found to have new lung masses one endobronchial and one at left epices as well as diffuse disease of right lung worrisome for atypical pneumonia  Pt reports appetite is decreased where pt was only consuming 1 meal a day with a snack at home which has been ongoing over  3-4 weeks. No current meal completion recorded, however pt consumed a [place] sandwich this AM. Usual body weight reported to be ~165 lbs. Weight loss not found significant. Pt is agreeable to Glucerna Shake to aid in caloric and protein needs. Pt encouraged to eat his food at meals.   Nutrition-Focused physical exam completed. Findings are moderate fat depletion, moderate muscle depletion, and no edema.   Labs and medications reviewed.   Diet Order:  Diet regular Room service appropriate?: Yes; Fluid consistency:: Thin Diet NPO time specified Except for: Sips with Meds  Skin:  Reviewed, no issues  Last BM:  3/22  Height:   Ht Readings from Last 1 Encounters:  01/18/16 5' 10.5" (1.791 m)    Weight:   Wt Readings from Last 1 Encounters:  01/18/16 163 lb (73.936 kg)    Ideal Body Weight:  76.8 kg  BMI:  Body mass index is 23.05 kg/(m^2).  Estimated Nutritional Needs:   Kcal:  1900-2100  Protein:   90-100 grams  Fluid:  1.9 - 2.1L/day  EDUCATION NEEDS:   No education needs identified at this time  Corrin Parker, MS, RD, LDN Pager # [phone] After hours/ weekend pager # [phone]

## 2016-01-19 NOTE — Progress Notes (Signed)
Patient arrived on unit via stretcher with nurse tech. Patient alert and oriented x4. Patient oriented to room, unit and staff. Patient placed on 2L of oxygen via nasal cannula. Skin assessment completed with Hilbert Corrigan, RN, no skin issues noted. Patient's IV clean, dry and intact. Patient rates pain 0/10.  Safety Fall Prevention Plan was given, discussed and signed by patient. Orders have been reviewed and implemented. Will continue to monitor the patient. Call light has been placed within reach.  Nena Polio BSN, RN  Phone Number: [phone]

## 2016-01-19 NOTE — Progress Notes (Addendum)
Patient ID: BODHI MORADI, male   DOB: 1956-03-12, 60 y.o.   MRN: [id number] [facility] PROGRESS NOTE  KASH DAVIE OIZ:[id number] DOB: Oct 11, 1956 DOA: 01/18/2016 PCP: No PCP Per Patient   Brief narrative:    60 y.o. male presented to [facility] ED 03/22 with several months duration of progressively worsening cough, mixed with non productive and productive cough of yellowish sputum, competed several rounds of ABX but seems like symptoms initially get better and soon after d/c ABX, symptoms worse. This has been associated with hoarseness, some weight loss, poor oral intaje and malaise. He reports he quit smoking 1 weeks ago and has been using nicotine patch since.   In ED, pt had CTA of the chest which was negative for PE but did reveal collapse of LLL with abrupt termination of LLL bronchus suspicious for endobronchial lesion. Pt also had masslike consolidation of the LUL with adjacent pleural effusion and suspicious for mass lesion. In addition. He had diffuse emphysematous changes. Pt was admitted by [facility] and PCCM was called for further evaluation / consideration of bronchoscopy.  Assessment/Plan:    Principal Problem:   Dyspnea due to Mass of lower lobe of left lung - Presumed LLL endobronchial lesion with additional mass lesion and probable malignant effusion, ? CAP - Keep NPO after midnight. - bronch planned for am 3/24 - Continue empiric CAP coverage. - Supplemental O2 as needed to maintain SpO2 > 92%. - obacco cessation counseling provided   Active Problems:   Pulmonary emphysema ([place]) - provide BD's scheduled and as needed  - continue Empiric ABX Zithromax and Rocephin - also continue Solumedrol and taper down as indicated     Hyponatremia - improved with IVF but also possibly related to malignant process  - continue to monitor     DM type 2 (diabetes mellitus, type 2) (HCC) - hold metformin for now as pt NPO - placed on SSI    Tobacco abuse - counseled on  cessation     Essential hypertension - reasonable inpatient control   DVT prophylaxis - Lovenox SQ   Code Status: Full.  Family Communication:  plan of care discussed with the patient Disposition Plan: Home when cleared by PCCM   IV access:  Peripheral IV  Procedures and diagnostic studies:    Dg Chest 2 View 01/09/2016 New focal masslike opacity in the posterior left upper lung, cannot exclude pulmonary neoplasm. 2. Questionable mild thickening of the right paratracheal stripe, adenopathy not excluded.  Ct Angio Chest Pe W/cm &/or Wo Cm 01/18/2016   No evidence of significant pulmonary embolus. 2. Diffuse emphysematous changes in the lungs. Diffuse alveolar infiltration in the right lung is probably due to pneumonia but asymmetric edema could also have this appearance. 3. Collapse of the left lower lung with abrupt termination of the left lower lobe bronchus, suspicious for endobronchial lesion. Consider bronchoscopy for further evaluation. 4. Masslike consolidation in the left upper lung with adjacent pleural effusion and tenting. This is highly suspicious for a mass lesion. 5. Esophageal dilatation likely due to dysmotility although distal stricture not excluded.   Medical Consultants:  PCCM  Other Consultants:  None  IAnti-Infectives:   Zithromax and Rocephin 3/22 -->  Faye Ramsay, MD  [facility] Pager [phone]  If 7PM-7AM, please contact night-coverage [email or web address] Password [facility] 01/19/2016, 6:12 PM   LOS: 1 day   HPI/Subjective: No events overnight.   Objective: Filed Vitals:   01/19/16 0415 01/19/16 0728 01/19/16 0900 01/19/16 1629  BP: 132/76  124/80  131/76  Pulse: 90 72 88 89  Temp: 98.4 F (36.9 C)  98.4 F (36.9 C) 97.7 F (36.5 C)  TempSrc: Oral  Oral Oral  Resp: '20 16 18 18  '$ Height:      Weight:      SpO2: 95%  99% 94%    Intake/Output Summary (Last 24 hours) at 01/19/16 1812 Last data filed at 01/19/16 1500  Gross per 24 hour  Intake      0  ml  Output    600 ml  Net   -600 ml    Exam:   General:  Pt is alert, follows commands appropriately, not in acute distress  Cardiovascular: Regular rate and rhythm, S1/S2, no murmurs, no rubs, no gallops  Respiratory: course breath sounds with rhonchi at the left base   Abdomen: Soft, non tender, non distended, bowel sounds present, no guarding  Extremities: pulses DP and PT palpable bilaterally  Neuro: Grossly nonfocal  Data Reviewed: Basic Metabolic Panel:  Recent Labs Lab 01/18/16 1633 01/19/16 0701  NA 124* 130*  K 4.4 4.6  CL 90* 95*  CO2 22 23  GLUCOSE 152* 146*  BUN 9 6  CREATININE 0.72 0.75  CALCIUM 9.3 9.4   Liver Function Tests:  Recent Labs Lab 01/19/16 0701  AST 18  ALT 12*  ALKPHOS 75  BILITOT 0.5  PROT 6.5  ALBUMIN 3.5   CBC:  Recent Labs Lab 01/18/16 1633 01/19/16 0701  WBC 10.2 10.3  NEUTROABS  --  9.4*  HGB 14.5 15.4  HCT 40.3 42.4  MCV 78.7 79.3  PLT 230 226   CBG:  Recent Labs Lab 01/19/16 0100 01/19/16 0357 01/19/16 0740 01/19/16 1146 01/19/16 1630  GLUCAP 107* 113* 147* 221* 145*    Recent Results (from the past 240 hour(s))  Culture, sputum-assessment     Status: None   Collection Time: 01/19/16  7:45 AM  Result Value Ref Range Status   Specimen Description SPUTUM  Final   Special Requests Normal  Final   Sputum evaluation   Final    MICROSCOPIC FINDINGS SUGGEST THAT THIS SPECIMEN IS NOT REPRESENTATIVE OF LOWER RESPIRATORY SECRETIONS. PLEASE RECOLLECT. Gram Stain Report Called to,Read Back By and Verified With: T HUBBARD,RN AT 0852 01/19/16 BY L BENFIELD    Report Status 01/19/2016 FINAL  Final     Scheduled Meds: . amLODipine  10 mg Oral Daily  . antiseptic oral rinse  7 mL Mouth Rinse BID  . azithromycin  500 mg Intravenous Q24H  . cefTRIAXone (ROCEPHIN)  IV  1 g Intravenous Q24H  . cloZAPine  100 mg Oral QHS  . divalproex  500 mg Oral Daily   And  . divalproex  1,000 mg Oral QHS  . feeding  supplement (GLUCERNA SHAKE)  237 mL Oral BID BM  . gemfibrozil  600 mg Oral BID AC  . insulin aspart  0-9 Units Subcutaneous 6 times per day  . ipratropium  0.5 mg Nebulization Q6H  . methylPREDNISolone (SOLU-MEDROL) injection  40 mg Intravenous BID  . nicotine  21 mg Transdermal Daily  . pantoprazole  40 mg Oral Daily   Continuous Infusions:

## 2016-01-20 ENCOUNTER — Inpatient Hospital Stay (HOSPITAL_COMMUNITY): Payer: Medicare Other

## 2016-01-20 ENCOUNTER — Encounter (HOSPITAL_COMMUNITY)
Admission: EM | Disposition: A | Discharge status: Skilled Nursing Facility | Payer: Self-pay | Source: Home / Self Care | Attending: Internal Medicine

## 2016-01-20 ENCOUNTER — Encounter (HOSPITAL_COMMUNITY): Payer: Self-pay | Admitting: Radiology

## 2016-01-20 DIAGNOSIS — J984 Other disorders of lung: Secondary | ICD-10-CM

## 2016-01-20 HISTORY — PX: VIDEO BRONCHOSCOPY: SHX5072

## 2016-01-20 LAB — BASIC METABOLIC PANEL
Anion gap: 10 (ref 5–15)
BUN: 12 mg/dL (ref 6–20)
CALCIUM: 9.3 mg/dL (ref 8.9–10.3)
CHLORIDE: 96 mmol/L — AB (ref 101–111)
CO2: 24 mmol/L (ref 22–32)
CREATININE: 0.63 mg/dL (ref 0.61–1.24)
GFR calc Af Amer: >60 mL/min (ref >60)
GFR calc non Af Amer: >60 mL/min (ref >60)
GLUCOSE: 157 mg/dL — AB (ref 65–99)
Potassium: 4 mmol/L (ref 3.5–5.1)
Sodium: 130 mmol/L — ABNORMAL LOW (ref 135–145)

## 2016-01-20 LAB — HEMOGLOBIN A1C
Hgb A1c MFr Bld: 6.3 % — ABNORMAL HIGH (ref 4.8–5.6)
MEAN PLASMA GLUCOSE: 134 mg/dL

## 2016-01-20 LAB — CBC
HEMATOCRIT: 38.4 % — AB (ref 39.0–52.0)
Hemoglobin: 13.8 g/dL (ref 13.0–17.0)
MCH: 28.4 pg (ref 26.0–34.0)
MCHC: 35.9 g/dL (ref 30.0–36.0)
MCV: 79 fL (ref 78.0–100.0)
Platelets: 224 10*3/uL (ref 150–400)
RBC: 4.86 MIL/uL (ref 4.22–5.81)
RDW: 13.5 % (ref 11.5–15.5)
WBC: 14.4 10*3/uL — ABNORMAL HIGH (ref 4.0–10.5)

## 2016-01-20 LAB — GLUCOSE, CAPILLARY
GLUCOSE-CAPILLARY: 181 mg/dL — AB (ref 65–99)
GLUCOSE-CAPILLARY: 183 mg/dL — AB (ref 65–99)
GLUCOSE-CAPILLARY: 213 mg/dL — AB (ref 65–99)
Glucose-Capillary: 140 mg/dL — ABNORMAL HIGH (ref 65–99)
Glucose-Capillary: 153 mg/dL — ABNORMAL HIGH (ref 65–99)
Glucose-Capillary: 176 mg/dL — ABNORMAL HIGH (ref 65–99)
Glucose-Capillary: 255 mg/dL — ABNORMAL HIGH (ref 65–99)

## 2016-01-20 LAB — LEGIONELLA PNEUMOPHILA SEROGP 1 UR AG: L. pneumophila Serogp 1 Ur Ag: NEGATIVE

## 2016-01-20 SURGERY — VIDEO BRONCHOSCOPY WITHOUT FLUORO
Anesthesia: Moderate Sedation | Laterality: Bilateral

## 2016-01-20 MED ORDER — MIDAZOLAM HCL 5 MG/ML IJ SOLN
INTRAMUSCULAR | Status: AC
  Filled 2016-01-20: qty 2

## 2016-01-20 MED ORDER — AZITHROMYCIN 500 MG PO TABS
500.0000 mg | ORAL_TABLET | Freq: Every day | ORAL | Status: DC
  Administered 2016-01-20 – 2016-01-21 (×2): 500 mg via ORAL
  Filled 2016-01-20 (×2): qty 1

## 2016-01-20 MED ORDER — PHENYLEPHRINE HCL 0.25 % NA SOLN
NASAL | Status: DC | PRN
  Administered 2016-01-20: 2 via NASAL

## 2016-01-20 MED ORDER — LIDOCAINE HCL 2 % EX GEL
CUTANEOUS | Status: DC | PRN
  Administered 2016-01-20: 1

## 2016-01-20 MED ORDER — IOHEXOL 300 MG/ML  SOLN
75.0000 mL | Freq: Once | INTRAMUSCULAR | Status: AC | PRN
  Administered 2016-01-20: 75 mL via INTRAVENOUS

## 2016-01-20 MED ORDER — IPRATROPIUM-ALBUTEROL 0.5-2.5 (3) MG/3ML IN SOLN
3.0000 mL | Freq: Four times a day (QID) | RESPIRATORY_TRACT | Status: DC
  Administered 2016-01-20 – 2016-01-23 (×14): 3 mL via RESPIRATORY_TRACT
  Filled 2016-01-20 (×14): qty 3

## 2016-01-20 MED ORDER — FENTANYL CITRATE (PF) 100 MCG/2ML IJ SOLN
INTRAMUSCULAR | Status: DC | PRN
  Administered 2016-01-20: 25 ug via INTRAVENOUS

## 2016-01-20 MED ORDER — FENTANYL CITRATE (PF) 100 MCG/2ML IJ SOLN
INTRAMUSCULAR | Status: AC
Start: 2016-01-20 — End: 2016-01-20
  Filled 2016-01-20: qty 4

## 2016-01-20 MED ORDER — ENOXAPARIN SODIUM 40 MG/0.4ML ~~LOC~~ SOLN
40.0000 mg | SUBCUTANEOUS | Status: DC
  Administered 2016-01-20 – 2016-01-29 (×9): 40 mg via SUBCUTANEOUS
  Filled 2016-01-20 (×10): qty 0.4

## 2016-01-20 MED ORDER — SODIUM CHLORIDE 0.9 % IV SOLN
INTRAVENOUS | Status: DC
  Administered 2016-01-20 – 2016-01-25 (×2): via INTRAVENOUS

## 2016-01-20 MED ORDER — MIDAZOLAM HCL 10 MG/2ML IJ SOLN
INTRAMUSCULAR | Status: DC | PRN
  Administered 2016-01-20: 1 mg via INTRAVENOUS

## 2016-01-20 MED ORDER — LIDOCAINE HCL (PF) 1 % IJ SOLN
INTRAMUSCULAR | Status: DC | PRN
  Administered 2016-01-20: 6 mL

## 2016-01-20 NOTE — Progress Notes (Signed)
[facility] Progress Note                                            Patient Demographics:    Chad Wang, is a 60 y.o. male, DOB - 02-Jan-1956, LJQ:[id number]  Admit date - 01/18/2016   Admitting Physician Toy Baker, MD  Outpatient Primary MD for the patient is No PCP Per Patient  LOS - 2  Outpatient Specialists:  Chief Complaint  Patient presents with  . Shortness of Breath       Subjective:    Laruth Bouchard today has, No headache, No chest pain, No abdominal pain - No Nausea, No new weakness tingling or numbness, positive cough, productive. Positive dyspnea, moderate in intensity. Worse with movement.    Assessment  & Plan :    Principal Problem:   Mass of lower lobe of left lung Active Problems:   Community acquired pneumonia   Hyponatremia   DM type 2 (diabetes mellitus, type 2) (HCC)   Tobacco abuse   Pulmonary emphysema (HCC)   Malnutrition of moderate degree   Essential hypertension   1. Cardiovascular. Will continue antibiotic therapy with ceftriaxone and azithromycin IV, wbc at 14.3, patient afebrile.  No signs of sepsis.  2. Pulmonary. 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 Will continue to monitor oxymetry and supplemental -02 per [place], to target 02 sat above 92%. Continue bronchodilator therapy with duonebs. On steroids per recommendation from Pulmonary. Nicotine patch for smoking cessation.    3. Nephrology. Patient off IV fluids. Renal function with cr at 0.63 with Na at 139 and ck at 96. Patient tolerating po well, with no nausea or vomiting.  4. Neurology.  Patient awake and alert.  5. Endocrine. Serum glucose [phone].  Continue insulin sliding scale to monitor and coverage glucose.  6. Neurology. Will continue divalproex per home regimen. Continue clozapine.   Family Communication  : I spoke with patient about patient's condition  Disposition Plan  :  Home after completing workup.  Barriers For Discharge :   Consults  :  Pulmonary                     ENT  Procedures  : Bronchoscopy  DVT Prophylaxis  :  Lovenox -  Lab Results  Component Value Date   PLT 224 01/20/2016    Antibiotics  :  Ceftriaxone and azithromycin   Anti-infectives    Start     Dose/Rate Route Frequency Ordered Stop   01/20/16 2200  azithromycin (ZITHROMAX) tablet 500 mg     500 mg Oral Daily at bedtime 01/20/16 1201 01/25/16 2159  01/19/16 2200  cefTRIAXone (ROCEPHIN) 1 g in dextrose 5 % 50 mL IVPB     1 g 100 mL/hr over 30 Minutes Intravenous Every 24 hours 01/19/16 0045 01/26/16 2159   01/19/16 2200  azithromycin (ZITHROMAX) 500 mg in dextrose 5 % 250 mL IVPB  Status:  Discontinued     500 mg 250 mL/hr over 60 Minutes Intravenous Every 24 hours 01/19/16 0045 01/20/16 1201   01/18/16 2145  azithromycin (ZITHROMAX) tablet 500 mg     500 mg Oral  Once 01/18/16 2144 01/18/16 2211   01/18/16 2145  cefTRIAXone (ROCEPHIN) 1 g in dextrose 5 % 50 mL IVPB     1 g 100 mL/hr over 30 Minutes Intravenous  Once 01/18/16 2144 01/18/16 2240        Objective:   Filed Vitals:   01/20/16 0805 01/20/16 0809 01/20/16 0900 01/20/16 1114  BP: 159/81  124/77   Pulse: 62 77 65   Temp:   98 F (36.7 C)   TempSrc:   Oral   Resp: '17 18 16   '$ Height:      Weight:      SpO2: 94% 94% 98% 98%    Wt Readings from Last 3 Encounters:  01/20/16 73.936 kg (163 lb)  01/09/16 73.392 kg (161 lb 12.8 oz)  12/13/12 73.936 kg (163 lb)     Intake/Output Summary (Last 24 hours) at 01/20/16 1203 Last data filed at 01/20/16 0600  Gross per 24 hour  Intake    540 ml    Output    750 ml  Net   -210 ml     Physical Exam  Awake Alert, Oriented X 3, No new F.N deficits, Normal affect Ideal.AT,PERRAL. Hoarse voice. Supple Neck,No JVD, No cervical lymphadenopathy appriciated. No accessory muscle use.  Symmetrical Chest wall movement, Positive rhonchi bilaterally, with poor inspiratory effort. RRR,No Gallops,Rubs or new Murmurs, No Parasternal Heave, tachycardia. +ve B.Sounds, Abd Soft, No tenderness, No organomegaly appriciated, No rebound - guarding or rigidity. No Cyanosis, Clubbing or edema, No new Rash or bruise     Data Review:    CBC  Recent Labs Lab 01/18/16 1633 01/19/16 0701 01/20/16 0527  WBC 10.2 10.3 14.4*  HGB 14.5 15.4 13.8  HCT 40.3 42.4 38.4*  PLT 230 226 224  MCV 78.7 79.3 79.0  MCH 28.3 28.8 28.4  MCHC 36.0 36.3* 35.9  RDW 13.3 13.7 13.5  LYMPHSABS  --  0.7  --   MONOABS  --  0.2  --   EOSABS  --  0.0  --   BASOSABS  --  0.0  --     Chemistries   Recent Labs Lab 01/18/16 1633 01/19/16 0701 01/20/16 0527  NA 124* 130* 130*  K 4.4 4.6 4.0  CL 90* 95* 96*  CO2 '22 23 24  '$ GLUCOSE 152* 146* 157*  BUN '9 6 12  '$ CREATININE 0.72 0.75 0.63  CALCIUM 9.3 9.4 9.3  AST  --  18  --   ALT  --  12*  --   ALKPHOS  --  75  --   BILITOT  --  0.5  --    ------------------------------------------------------------------------------------------------------------------ No results for input(s): CHOL, HDL, LDLCALC, TRIG, CHOLHDL, LDLDIRECT in the last 72 hours.  Lab Results  Component Value Date   HGBA1C 6.3* 01/19/2016   ------------------------------------------------------------------------------------------------------------------ No results for input(s): TSH, T4TOTAL, T3FREE, THYROIDAB in the last 72 hours.  Invalid input(s): FREET3 ------------------------------------------------------------------------------------------------------------------ No results for input(s): VITAMINB12, FOLATE, FERRITIN, TIBC,  IRON, RETICCTPCT  in the last 72 hours.  Coagulation profile No results for input(s): INR, PROTIME in the last 168 hours.  No results for input(s): DDIMER in the last 72 hours.  Cardiac Enzymes No results for input(s): CKMB, TROPONINI, MYOGLOBIN in the last 168 hours.  Invalid input(s): CK ------------------------------------------------------------------------------------------------------------------    Component Value Date/Time   BNP 24.5 01/18/2016 1634    Inpatient Medications  Scheduled Meds: . amLODipine  10 mg Oral Daily  . antiseptic oral rinse  7 mL Mouth Rinse BID  . azithromycin  500 mg Oral QHS  . cefTRIAXone (ROCEPHIN)  IV  1 g Intravenous Q24H  . cloZAPine  100 mg Oral QHS  . divalproex  500 mg Oral Daily   And  . divalproex  1,000 mg Oral QHS  . feeding supplement (GLUCERNA SHAKE)  237 mL Oral BID BM  . gemfibrozil  600 mg Oral BID AC  . insulin aspart  0-9 Units Subcutaneous 6 times per day  . ipratropium-albuterol  3 mL Nebulization QID  . methylPREDNISolone (SOLU-MEDROL) injection  40 mg Intravenous BID  . nicotine  21 mg Transdermal Daily  . pantoprazole  40 mg Oral Daily   Continuous Infusions: . sodium chloride 10 mL/hr at 01/20/16 0710   PRN Meds:.albuterol  Micro Results Recent Results (from the past 240 hour(s))  Culture, sputum-assessment     Status: None   Collection Time: 01/19/16  7:45 AM  Result Value Ref Range Status   Specimen Description SPUTUM  Final   Special Requests Normal  Final   Sputum evaluation   Final    MICROSCOPIC FINDINGS SUGGEST THAT THIS SPECIMEN IS NOT REPRESENTATIVE OF LOWER RESPIRATORY SECRETIONS. PLEASE RECOLLECT. Gram Stain Report Called to,Read Back By and Verified With: T HUBBARD,RN AT 1696 01/19/16 BY L BENFIELD    Report Status 01/19/2016 FINAL  Final    Radiology Reports Dg Chest 2 View  01/09/2016  CLINICAL DATA:  Gradual onset productive cough.  Current smoker. EXAM: CHEST  2 VIEW COMPARISON:  12/13/2012 chest  radiograph. FINDINGS: Normal heart size. Mildly tortuous thoracic aorta. Questionable mild thickening of the right paratracheal stripe. No pneumothorax. No pleural effusion. There is a new focal masslike opacity in the posterior left upper lung. There is new hyperlucency throughout the left lung. No pulmonary edema. IMPRESSION: 1. New focal masslike opacity in the posterior left upper lung, cannot exclude pulmonary neoplasm. 2. Questionable mild thickening of the right paratracheal stripe, adenopathy not excluded. 3. Recommend further evaluation with chest CT with IV contrast. Electronically Signed   By: Ilona Sorrel M.D.   On: 01/09/2016 12:33   Ct Angio Chest Pe W/cm &/or Wo Cm  01/18/2016  CLINICAL DATA:  Cough and shortness of breath for many months. Nodular opacity seen in left upper lung on chest radiograph. EXAM: CT ANGIOGRAPHY CHEST WITH CONTRAST TECHNIQUE: Multidetector CT imaging of the chest was performed using the standard protocol during bolus administration of intravenous contrast. Multiplanar CT image reconstructions and MIPs were obtained to evaluate the vascular anatomy. CONTRAST:  44m OMNIPAQUE IOHEXOL 350 MG/ML SOLN COMPARISON:  Chest 01/09/2016 FINDINGS: Technically adequate study with good opacification of the central and segmental pulmonary arteries. No focal filling defects are demonstrated. No evidence of significant pulmonary embolus. Normal heart size. Mildly dilated esophagus with small air-fluid level. This may be due to dysmotility disorder. Prominent mediastinal lymph nodes with mild enlargement of a right paratracheal lymph node, measuring 16 mm short axis dimension. This is nonspecific in could  be reactive or metastatic. There is diffuse emphysematous change throughout the lungs but more prominent in the left lung. Alveolar infiltrates in the right lung likely represent pneumonia but could indicate asymmetric edema. There is an abrupt termination to the left lower lobe bronchus  with complete collapse of the left lower lung. This suggest an endobronchial obstructing lesion. Bronchoscopy may be useful for further evaluation. Masslike consolidation in the left upper lung with associated left pleural effusion and tenting of the pleura. This measures about 3.8 cm maximal diameter. Appearance is suspicious for a mass lesion although a focal consolidation could potentially have this appearance. No pneumothorax. Included portions of the upper abdominal organs are grossly unremarkable. Degenerative changes in the spine. No destructive bone lesions. Review of the MIP images confirms the above findings. IMPRESSION: 1. No evidence of significant pulmonary embolus. 2. Diffuse emphysematous changes in the lungs. Diffuse alveolar infiltration in the right lung is probably due to pneumonia but asymmetric edema could also have this appearance. 3. Collapse of the left lower lung with abrupt termination of the left lower lobe bronchus, suspicious for endobronchial lesion. Consider bronchoscopy for further evaluation. 4. Masslike consolidation in the left upper lung with adjacent pleural effusion and tenting. This is highly suspicious for a mass lesion. 5. Esophageal dilatation likely due to dysmotility although distal stricture not excluded. Electronically Signed   By: Lucienne Capers M.D.   On: 01/18/2016 21:27     Nicolaas Savo Gerome Apley M.D on 01/20/2016 at 12:03 PM  Between 7am to 7pm - Pager   After 7pm go to [email or web address] - password [facility]  [facility] -  Office  [phone]

## 2016-01-20 NOTE — Progress Notes (Signed)
Name: Chad Wang MRN: [id number] DOB: 07/05/1956    ADMISSION DATE:  01/18/2016 CONSULTATION DATE:  01/18/16  REFERRING MD :  Roel Cluck  CHIEF COMPLAINT:  SOB   HISTORY OF PRESENT ILLNESS:  Chad Wang is a 60 y.o. male with a PMH as outlined below.  He presented to [facility] ED 03/22 with SOB and cough that has gradually worsened.  Symptoms started roughly 3 months ago and have persisted.  He has seen several providers and has had a few course of abx which would help temporarily, but symptoms would return shortly thereafter.  He also reports that roughly around time of symptom onset, he began to experience hoarseness / change in voice.  Over the past 3 - 4 weeks, he has had some weight loss, unsure exactly how much but states it hasn't been too much.  He does workout most days so isn't sure whether weight loss is attributed to working out or not.  He states he has certainly had decreased appetite and usually only eats 1 meal a day due to easy satiety.  He has not had any hemoptysis or noticed any swollen lymph nodes, but he has noticed increase in fatigue and generalized weakness.  He is a long time smoker, roughly 40 pack year history.  He states he quit smoking 1 week ago and has been using nicotine patch since then which has seemed to work fairly well.  In ED, he had CTA of the chest which was negative for PE but did reveal collapse of LLL with abrupt termination of LLL bronchus suspicious for endobronchial lesion.  He also had masslike consolidation of the LUL with adjacent pleural effusion and tenting, highly suspicious for mass lesion.  In addition. He had diffuse emphysematous changes.  He was admitted by [facility] and PCCM was called for further evaluation / consideration bronchoscopy.  SUBJECTIVE: SOB improved .  Denies chest pain. Last meal - last night Sleepy this am but conversant & answers all questions appropriately  VITAL SIGNS: Temp:  [97.3 F (36.3 C)-98.4 F (36.9 C)] 97.3 F  (36.3 C) (03/24 0700) Pulse Rate:  [85-89] 88 (03/24 0720) Resp:  [13-20] 18 (03/24 0720) BP: (124-151)/(70-87) 143/87 mmHg (03/24 0720) SpO2:  [93 %-99 %] 95 % (03/24 0720) Weight:  [163 lb (73.936 kg)] 163 lb (73.936 kg) (03/24 0700)  PHYSICAL EXAMINATION: General: Middle aged male, resting in bed, in NAD. Hoarse voice. Neuro: A&O x 3, non-focal.  HEENT: [place]/AT. PERRL, sclerae anicteric. Cardiovascular: RRR, no M/R/G.  Lungs: Respirations even and unlabored.  Diminished on left, otherwise clear. Abdomen: BS x 4, soft, NT/ND.  Musculoskeletal: No gross deformities, no edema.  Skin: Intact, warm, no rashes.    Recent Labs Lab 01/18/16 1633 01/19/16 0701 01/20/16 0527  NA 124* 130* 130*  K 4.4 4.6 4.0  CL 90* 95* 96*  CO2 '22 23 24  '$ BUN '9 6 12  '$ CREATININE 0.72 0.75 0.63  GLUCOSE 152* 146* 157*    Recent Labs Lab 01/18/16 1633 01/19/16 0701 01/20/16 0527  HGB 14.5 15.4 13.8  HCT 40.3 42.4 38.4*  WBC 10.2 10.3 14.4*  PLT 230 226 224   Ct Angio Chest Pe W/cm &/or Wo Cm  01/18/2016  CLINICAL DATA:  Cough and shortness of breath for many months. Nodular opacity seen in left upper lung on chest radiograph. EXAM: CT ANGIOGRAPHY CHEST WITH CONTRAST TECHNIQUE: Multidetector CT imaging of the chest was performed using the standard protocol during bolus administration of intravenous contrast. Multiplanar  CT image reconstructions and MIPs were obtained to evaluate the vascular anatomy. CONTRAST:  16m OMNIPAQUE IOHEXOL 350 MG/ML SOLN COMPARISON:  Chest 01/09/2016 FINDINGS: Technically adequate study with good opacification of the central and segmental pulmonary arteries. No focal filling defects are demonstrated. No evidence of significant pulmonary embolus. Normal heart size. Mildly dilated esophagus with small air-fluid level. This may be due to dysmotility disorder. Prominent mediastinal lymph nodes with mild enlargement of a right paratracheal lymph node, measuring 16 mm short axis  dimension. This is nonspecific in could be reactive or metastatic. There is diffuse emphysematous change throughout the lungs but more prominent in the left lung. Alveolar infiltrates in the right lung likely represent pneumonia but could indicate asymmetric edema. There is an abrupt termination to the left lower lobe bronchus with complete collapse of the left lower lung. This suggest an endobronchial obstructing lesion. Bronchoscopy may be useful for further evaluation. Masslike consolidation in the left upper lung with associated left pleural effusion and tenting of the pleura. This measures about 3.8 cm maximal diameter. Appearance is suspicious for a mass lesion although a focal consolidation could potentially have this appearance. No pneumothorax. Included portions of the upper abdominal organs are grossly unremarkable. Degenerative changes in the spine. No destructive bone lesions. Review of the MIP images confirms the above findings. IMPRESSION: 1. No evidence of significant pulmonary embolus. 2. Diffuse emphysematous changes in the lungs. Diffuse alveolar infiltration in the right lung is probably due to pneumonia but asymmetric edema could also have this appearance. 3. Collapse of the left lower lung with abrupt termination of the left lower lobe bronchus, suspicious for endobronchial lesion. Consider bronchoscopy for further evaluation. 4. Masslike consolidation in the left upper lung with adjacent pleural effusion and tenting. This is highly suspicious for a mass lesion. 5. Esophageal dilatation likely due to dysmotility although distal stricture not excluded. Electronically Signed   By: WLucienne CapersM.D.   On: 01/18/2016 21:27    STUDIES:  CTA chest 03/22 > negative for PE.  Collapse of LLL with abrupt termination of LLL bronchus suspicious for endobronchial lesion.  Masslike consolidation of the LUL with adjacent pleural effusion and tenting, highly suspicious for mass lesion.  In addition,  diffuse emphysematous changes.  SIGNIFICANT EVENTS  03/22 > admitted for further evaluation of presumed lung mass. 3/24 bronchoscopy- large mass above vocal cords  -90% obstruction, could not traverse  ASSESSMENT / PLAN:  Presumed LLL endobronchial lesion with additional mass lesion and probable malignant effusion. Probable CAP. Dyspnea - due to above. Tobacco use disorder. Large vocal cord mass Plan:  ENT consult -called Continue empiric CAP coverage. Supplemental O2 as needed to maintain SpO2 > 92%. Tobacco cessation counseling.   RKara MeadMD. FShade Flood [facility] Pager [phone] no response call [phone]   01/20/2016    01/20/2016, 7:54 AM

## 2016-01-20 NOTE — Care Management Important Message (Signed)
Important Message  Patient Details  Name: HOSAM MCFETRIDGE MRN: [id number] Date of Birth: 05-30-1956   Medicare Important Message Given:  Yes    [name] Sombrillo 01/20/2016, 12:54 PM

## 2016-01-20 NOTE — Op Note (Signed)
[facility] Cardiopulmonary Patient Name: Chad Wang Date: 01/20/2016 MRN: [id number] Attending MD: Kara Mead , MD Date of Birth: May 12, 1956 CSN: Finalized Age: 60 Admit Type: Inpatient Gender: Male Procedure:            Bronchoscopy Indications:          Left mainstem mass Providers:            Kara Mead, MD, Doris Cheadle RRT,RCP, Tammie Readling                        RRT,RCP Referring MD:          Medicines:            Midazolam 1 mg IV, Fentanyl 25 mcg IV Complications:        No immediate complications. Estimated blood loss: None Estimated Blood Loss: Estimated blood loss: none. Procedure:            Pre-Anesthesia Assessment:                       - A History and Physical has been performed. Patient                        meds and allergies have been reviewed. The risks and                        benefits of the procedure and the sedation options and                        risks were discussed with the patient. All questions                        were answered and informed consent was obtained.                        Patient identification and proposed procedure were                        verified prior to the procedure. Mental Status                        Examination: lethargic. Respiratory Examination: clear                        to auscultation. CV Examination: normal. ASA Grade                        Assessment: II - A patient with mild systemic disease.                        After reviewing the risks and benefits, the patient was                        deemed in satisfactory condition to undergo the                        procedure. The anesthesia plan was to use moderate                        sedation / analgesia (conscious sedation). Immediately  prior to administration of medications, the patient was                        re-assessed for adequacy to receive sedatives. The                        heart rate,  respiratory rate, oxygen saturations, blood                        pressure, adequacy of pulmonary ventilation, and                        response to care were monitored throughout the                        procedure. The physical status of the patient was                        re-assessed after the procedure.                       After obtaining informed consent, the bronchoscope was                        passed under direct vision. Throughout the procedure,                        the patient's blood pressure, pulse, and oxygen                        saturations were monitored continuously. the [id number]                        [id number] scope was introduced through the right nostril                        The procedure was aborted due to a partially                        obstructing mass. The patient tolerated the procedure                        fairly well. Scope In: 7:35:00 AM Scope Out: 7:42:00 AM Findings:      Larynx: A large polypoid mass was found at the glottis. The mass is       partially obstructing (about 90% obstructed) the airway. Impression:           - Left mainstem mass                       - Polypoid mass was found at the glottis. This lesion                        is suspicious for malignancy.                       - No specimens collected. Moderate Sedation:      Moderate (conscious) sedation was personally administered by the       endoscopist. The following parameters were monitored: oxygen saturation,       heart rate, blood pressure,  and response to care. Total physician       intraservice time was 10 minutes. Recommendation:       - Refer to/consult with ENT. Procedure Code(s):    --- Professional ---                       [phone], 54, Bronchoscopy, rigid or flexible, including                        fluoroscopic guidance, when performed; diagnostic, with                        cell washing, when performed (separate procedure)                       99152,  Moderate sedation services provided by the same                        physician or other qualified health care professional                        performing the diagnostic or therapeutic service that                        the sedation supports, requiring the presence of an                        independent trained observer to assist in the                        monitoring of the patient's level of consciousness and                        physiological status; initial 15 minutes of                        intraservice time, patient age 81 years or older Diagnosis Code(s):    --- Professional ---                       R91.8, Other nonspecific abnormal finding of lung field                       J38.7, Other diseases of larynx CPT copyright 2016 [employer]. All rights reserved. The codes documented in this report are preliminary and upon coder review may  be revised to meet current compliance requirements. Kara Mead, MD Kara Mead, MD 01/20/2016 7:52:33 AM This report has been signed electronically. Number of Addenda: 0

## 2016-01-20 NOTE — Consult Note (Signed)
Reason for Consult:Laryngeal mass Referring Physician: [name] is an 60 y.o. male.  HPI: 60 year old male admitted yesterday due to several days of worsening difficulty breathing.  He has had some hoarseness for about three months along with cough and some shortness of breath.  He has been treated with multiple rounds of antibiotics with limited benefit.  He came to the ER because he could not walk to his mailbox without becoming quite short of breath.  Here, he was treated with antibiotics and pulmonology performed a bronchoscopy this morning demonstrating a glottic mass.  CT imaging demonstrates a peribronchial mass in the mediastinum.  Consultation was requested.  Past Medical History  Diagnosis Date  . Schizophrenia ([place])   . Hypertension   . Hyperlipemia   . GERD (gastroesophageal reflux disease)   . Diabetes mellitus without complication [facility])     Past Surgical History  Procedure Laterality Date  . Knee arthroscopy      Family History  Problem Relation Age of Onset  . CAD Mother   . Lung cancer Father   . CAD Brother   . Diabetes Other   . Stroke Neg Hx     Social History:  reports that he has been smoking.  He does not have any smokeless tobacco history on file. He reports that he drinks alcohol. He reports that he does not use illicit drugs.  Allergies: No Known Allergies  Medications: I have reviewed the patient's current medications.  Results for orders placed or performed during the hospital encounter of 01/18/16 (from the past 48 hour(s))  Basic metabolic panel     Status: Abnormal   Collection Time: 01/18/16  4:33 PM  Result Value Ref Range   Sodium 124 (L) 135 - 145 mmol/L   Potassium 4.4 3.5 - 5.1 mmol/L   Chloride 90 (L) 101 - 111 mmol/L   CO2 22 22 - 32 mmol/L   Glucose, Bld 152 (H) 65 - 99 mg/dL   BUN 9 6 - 20 mg/dL   Creatinine, Ser 0.72 0.61 - 1.24 mg/dL   Calcium 9.3 8.9 - 10.3 mg/dL   GFR calc non Af Amer >60 >60 mL/min   GFR calc  Af Amer >60 >60 mL/min    Comment: (NOTE) The eGFR has been calculated using the CKD EPI equation. This calculation has not been validated in all clinical situations. eGFR's persistently <60 mL/min signify possible Chronic Kidney Disease.    Anion gap 12 5 - 15  CBC     Status: None   Collection Time: 01/18/16  4:33 PM  Result Value Ref Range   WBC 10.2 4.0 - 10.5 K/uL   RBC 5.12 4.22 - 5.81 MIL/uL   Hemoglobin 14.5 13.0 - 17.0 g/dL   HCT 40.3 39.0 - 52.0 %   MCV 78.7 78.0 - 100.0 fL   MCH 28.3 26.0 - 34.0 pg   MCHC 36.0 30.0 - 36.0 g/dL   RDW 13.3 11.5 - 15.5 %   Platelets 230 150 - 400 K/uL  I-stat troponin, ED (not at [facility], [facility])     Status: None   Collection Time: 01/18/16  4:33 PM  Result Value Ref Range   Troponin i, poc 0.02 0.00 - 0.08 ng/mL   Comment 3            Comment: Due to the release kinetics of cTnI, a negative result within the first hours of the onset of symptoms does not rule out myocardial infarction  with certainty. If myocardial infarction is still suspected, repeat the test at appropriate intervals.   Brain natriuretic peptide     Status: None   Collection Time: 01/18/16  4:34 PM  Result Value Ref Range   B Natriuretic Peptide 24.5 0.0 - 100.0 pg/mL  Glucose, capillary     Status: Abnormal   Collection Time: 01/19/16  1:00 AM  Result Value Ref Range   Glucose-Capillary 107 (H) 65 - 99 mg/dL  Hemoglobin A1c     Status: Abnormal   Collection Time: 01/19/16  1:46 AM  Result Value Ref Range   Hgb A1c MFr Bld 6.3 (H) 4.8 - 5.6 %    Comment: (NOTE)         Pre-diabetes: 5.7 - 6.4         Diabetes: >6.4         Glycemic control for adults with diabetes: <7.0    Mean Plasma Glucose 134 mg/dL    Comment: (NOTE) Performed At: [facility] [place], [place] [id number] Lindon Romp MD [id number]   Strep pneumoniae urinary antigen     Status: None   Collection Time: 01/19/16  2:16 AM  Result Value Ref Range   Strep Pneumo  Urinary Antigen NEGATIVE NEGATIVE    Comment:        Infection due to S. pneumoniae cannot be absolutely ruled out since the antigen present may be below the detection limit of the test.   Creatinine, urine, random     Status: None   Collection Time: 01/19/16  2:16 AM  Result Value Ref Range   Creatinine, Urine 16.70 mg/dL  Sodium, urine, random     Status: None   Collection Time: 01/19/16  2:16 AM  Result Value Ref Range   Sodium, Ur 17 mmol/L  Osmolality, urine     Status: Abnormal   Collection Time: 01/19/16  2:17 AM  Result Value Ref Range   Osmolality, Ur 132 (L) 300 - 900 mOsm/kg  Glucose, capillary     Status: Abnormal   Collection Time: 01/19/16  3:57 AM  Result Value Ref Range   Glucose-Capillary 113 (H) 65 - 99 mg/dL  Comprehensive metabolic panel     Status: Abnormal   Collection Time: 01/19/16  7:01 AM  Result Value Ref Range   Sodium 130 (L) 135 - 145 mmol/L   Potassium 4.6 3.5 - 5.1 mmol/L   Chloride 95 (L) 101 - 111 mmol/L   CO2 23 22 - 32 mmol/L   Glucose, Bld 146 (H) 65 - 99 mg/dL   BUN 6 6 - 20 mg/dL   Creatinine, Ser 0.75 0.61 - 1.24 mg/dL   Calcium 9.4 8.9 - 10.3 mg/dL   Total Protein 6.5 6.5 - 8.1 g/dL   Albumin 3.5 3.5 - 5.0 g/dL   AST 18 15 - 41 U/L   ALT 12 (L) 17 - 63 U/L   Alkaline Phosphatase 75 38 - 126 U/L   Total Bilirubin 0.5 0.3 - 1.2 mg/dL   GFR calc non Af Amer >60 >60 mL/min   GFR calc Af Amer >60 >60 mL/min    Comment: (NOTE) The eGFR has been calculated using the CKD EPI equation. This calculation has not been validated in all clinical situations. eGFR's persistently <60 mL/min signify possible Chronic Kidney Disease.    Anion gap 12 5 - 15  CBC WITH DIFFERENTIAL     Status: Abnormal   Collection Time: 01/19/16  7:01 AM  Result  Value Ref Range   WBC 10.3 4.0 - 10.5 K/uL   RBC 5.35 4.22 - 5.81 MIL/uL   Hemoglobin 15.4 13.0 - 17.0 g/dL   HCT 42.4 39.0 - 52.0 %   MCV 79.3 78.0 - 100.0 fL   MCH 28.8 26.0 - 34.0 pg   MCHC 36.3  (H) 30.0 - 36.0 g/dL   RDW 13.7 11.5 - 15.5 %   Platelets 226 150 - 400 K/uL   Neutrophils Relative % 92 %   Neutro Abs 9.4 (H) 1.7 - 7.7 K/uL   Lymphocytes Relative 6 %   Lymphs Abs 0.7 0.7 - 4.0 K/uL   Monocytes Relative 2 %   Monocytes Absolute 0.2 0.1 - 1.0 K/uL   Eosinophils Relative 0 %   Eosinophils Absolute 0.0 0.0 - 0.7 K/uL   Basophils Relative 0 %   Basophils Absolute 0.0 0.0 - 0.1 K/uL  HIV antibody     Status: None   Collection Time: 01/19/16  7:01 AM  Result Value Ref Range   HIV Screen 4th Generation wRfx Non Reactive Non Reactive    Comment: (NOTE) Performed At: [facility] [address] [place], [place] [id number] Lindon Romp MD [id number]   Glucose, capillary     Status: Abnormal   Collection Time: 01/19/16  7:40 AM  Result Value Ref Range   Glucose-Capillary 147 (H) 65 - 99 mg/dL  Culture, sputum-assessment     Status: None   Collection Time: 01/19/16  7:45 AM  Result Value Ref Range   Specimen Description SPUTUM    Special Requests Normal    Sputum evaluation      MICROSCOPIC FINDINGS SUGGEST THAT THIS SPECIMEN IS NOT REPRESENTATIVE OF LOWER RESPIRATORY SECRETIONS. PLEASE RECOLLECT. Gram Stain Report Called to,Read Back By and Verified With: T HUBBARD,RN AT 0852 01/19/16 BY L BENFIELD    Report Status 01/19/2016 FINAL   Glucose, capillary     Status: Abnormal   Collection Time: 01/19/16 11:46 AM  Result Value Ref Range   Glucose-Capillary 221 (H) 65 - 99 mg/dL  Glucose, capillary     Status: Abnormal   Collection Time: 01/19/16  4:30 PM  Result Value Ref Range   Glucose-Capillary 145 (H) 65 - 99 mg/dL  Glucose, capillary     Status: Abnormal   Collection Time: 01/19/16  8:20 PM  Result Value Ref Range   Glucose-Capillary 192 (H) 65 - 99 mg/dL  Glucose, capillary     Status: Abnormal   Collection Time: 01/20/16 12:46 AM  Result Value Ref Range   Glucose-Capillary 176 (H) 65 - 99 mg/dL  Glucose, capillary     Status: Abnormal    Collection Time: 01/20/16  3:54 AM  Result Value Ref Range   Glucose-Capillary 140 (H) 65 - 99 mg/dL  CBC     Status: Abnormal   Collection Time: 01/20/16  5:27 AM  Result Value Ref Range   WBC 14.4 (H) 4.0 - 10.5 K/uL   RBC 4.86 4.22 - 5.81 MIL/uL   Hemoglobin 13.8 13.0 - 17.0 g/dL   HCT 38.4 (L) 39.0 - 52.0 %   MCV 79.0 78.0 - 100.0 fL   MCH 28.4 26.0 - 34.0 pg   MCHC 35.9 30.0 - 36.0 g/dL   RDW 13.5 11.5 - 15.5 %   Platelets 224 150 - 400 K/uL  Basic metabolic panel     Status: Abnormal   Collection Time: 01/20/16  5:27 AM  Result Value Ref Range   Sodium 130 (  L) 135 - 145 mmol/L   Potassium 4.0 3.5 - 5.1 mmol/L   Chloride 96 (L) 101 - 111 mmol/L   CO2 24 22 - 32 mmol/L   Glucose, Bld 157 (H) 65 - 99 mg/dL   BUN 12 6 - 20 mg/dL   Creatinine, Ser 0.63 0.61 - 1.24 mg/dL   Calcium 9.3 8.9 - 10.3 mg/dL   GFR calc non Af Amer >60 >60 mL/min   GFR calc Af Amer >60 >60 mL/min    Comment: (NOTE) The eGFR has been calculated using the CKD EPI equation. This calculation has not been validated in all clinical situations. eGFR's persistently <60 mL/min signify possible Chronic Kidney Disease.    Anion gap 10 5 - 15    Ct Angio Chest Pe W/cm &/or Wo Cm  01/18/2016  CLINICAL DATA:  Cough and shortness of breath for many months. Nodular opacity seen in left upper lung on chest radiograph. EXAM: CT ANGIOGRAPHY CHEST WITH CONTRAST TECHNIQUE: Multidetector CT imaging of the chest was performed using the standard protocol during bolus administration of intravenous contrast. Multiplanar CT image reconstructions and MIPs were obtained to evaluate the vascular anatomy. CONTRAST:  62m OMNIPAQUE IOHEXOL 350 MG/ML SOLN COMPARISON:  Chest 01/09/2016 FINDINGS: Technically adequate study with good opacification of the central and segmental pulmonary arteries. No focal filling defects are demonstrated. No evidence of significant pulmonary embolus. Normal heart size. Mildly dilated esophagus with small  air-fluid level. This may be due to dysmotility disorder. Prominent mediastinal lymph nodes with mild enlargement of a right paratracheal lymph node, measuring 16 mm short axis dimension. This is nonspecific in could be reactive or metastatic. There is diffuse emphysematous change throughout the lungs but more prominent in the left lung. Alveolar infiltrates in the right lung likely represent pneumonia but could indicate asymmetric edema. There is an abrupt termination to the left lower lobe bronchus with complete collapse of the left lower lung. This suggest an endobronchial obstructing lesion. Bronchoscopy may be useful for further evaluation. Masslike consolidation in the left upper lung with associated left pleural effusion and tenting of the pleura. This measures about 3.8 cm maximal diameter. Appearance is suspicious for a mass lesion although a focal consolidation could potentially have this appearance. No pneumothorax. Included portions of the upper abdominal organs are grossly unremarkable. Degenerative changes in the spine. No destructive bone lesions. Review of the MIP images confirms the above findings. IMPRESSION: 1. No evidence of significant pulmonary embolus. 2. Diffuse emphysematous changes in the lungs. Diffuse alveolar infiltration in the right lung is probably due to pneumonia but asymmetric edema could also have this appearance. 3. Collapse of the left lower lung with abrupt termination of the left lower lobe bronchus, suspicious for endobronchial lesion. Consider bronchoscopy for further evaluation. 4. Masslike consolidation in the left upper lung with adjacent pleural effusion and tenting. This is highly suspicious for a mass lesion. 5. Esophageal dilatation likely due to dysmotility although distal stricture not excluded. Electronically Signed   By: WLucienne CapersM.D.   On: 01/18/2016 21:27    Review of Systems  Respiratory: Positive for shortness of breath.   All other systems  reviewed and are negative.  Blood pressure 159/81, pulse 77, temperature 97.3 F (36.3 C), temperature source Oral, resp. rate 18, height 5' 10"  (1.778 m), weight 73.936 kg (163 lb), SpO2 94 %. Physical Exam  Constitutional: He is oriented to person, place, and time. He appears well-developed and well-nourished. No distress.  HENT:  Head: Normocephalic  and atraumatic.  Right Ear: External ear normal.  Left Ear: External ear normal.  Nose: Nose normal.  Mouth/Throat: Oropharynx is clear and moist.  Low-pitched hoarseness, somewhat muffled voice.  Eyes: Conjunctivae and EOM are normal. Pupils are equal, round, and reactive to light.  Neck: Normal range of motion. Neck supple.  Cardiovascular: Normal rate.   Respiratory:  Some tachypnea, no stridor.  Musculoskeletal: Normal range of motion.  Neurological: He is alert and oriented to person, place, and time. No cranial nerve deficit.  Skin: Skin is warm and dry.  Psychiatric: He has a normal mood and affect. His behavior is normal. Judgment and thought content normal.    Assessment/Plan: Laryngeal mass, shortness of breath, mediastinal mass I personally reviewed the bronchoscopy report including viewing the pictures.  I personally reviewed his chest imaging.  The chest CT does not image the larynx.  I will order a neck CT with contrast.  I discussed the situation with the patient and recommended biopsy via microlaryngoscopy with CO2 laser.  A tracheostomy may be required.  Risks, benefits, and alternatives were discussed and he expressed understanding and agreement.  He wishes to wait until tomorrow for the procedure.  Andersen Mckiver 01/20/2016, 9:42 AM

## 2016-01-20 NOTE — Care Management Note (Signed)
Case Management Note  Patient Details  Name: Chad Wang MRN: [id number] Date of Birth: 01-31-1956  Subjective/Objective:        CM following for progression and d/c planning.            Action/Plan: 01/20/2016 Request from family member for POA information. This CM provided application forms for Healthcare POA and living will to pt room . The pt and his sister discussed they their concerns. This CM answered questions as able, their primary concerned being that the pt sister be able to receive info re pt status. This CM explained process of pt giving consent for this and that the POA only applied when the pt was no longer able to express his wishes. Both expressed understanding of this and will review POA and Living will information.   Expected Discharge Date:                  Expected Discharge Plan:  [place]  In-House Referral:  NA  Discharge planning Services  CM Consult  Post Acute Care Choice:    Choice offered to:     DME Arranged:    DME Agency:     HH Arranged:    HH Agency:     Status of Service:  In process, will continue to follow  Medicare Important Message Given:  Yes Date Medicare IM Given:    Medicare IM give by:    Date Additional Medicare IM Given:    Additional Medicare Important Message give by:     If discussed at [place] of Stay Meetings, dates discussed:    Additional Comments:  Adron Bene, RN 01/20/2016, 4:27 PM

## 2016-01-20 NOTE — Progress Notes (Signed)
Video bronchoscopy done ,only pictures taken of mass at top of cords. Leonidas Romberg RN called an given report, told to keep pt. Npo till after ENT doctor examines pt. All vitals good thru out.

## 2016-01-21 DIAGNOSIS — E1100 Type 2 diabetes mellitus with hyperosmolarity without nonketotic hyperglycemic-hyperosmolar coma (NKHHC): Secondary | ICD-10-CM

## 2016-01-21 LAB — CBC WITH DIFFERENTIAL/PLATELET
Basophils Absolute: 0 10*3/uL (ref 0.0–0.1)
Basophils Relative: 0 %
EOS PCT: 0 %
Eosinophils Absolute: 0 10*3/uL (ref 0.0–0.7)
HCT: 39.9 % (ref 39.0–52.0)
Hemoglobin: 14.2 g/dL (ref 13.0–17.0)
LYMPHS ABS: 1.2 10*3/uL (ref 0.7–4.0)
LYMPHS PCT: 7 %
MCH: 28.5 pg (ref 26.0–34.0)
MCHC: 35.6 g/dL (ref 30.0–36.0)
MCV: 80.1 fL (ref 78.0–100.0)
MONO ABS: 0.4 10*3/uL (ref 0.1–1.0)
MONOS PCT: 2 %
Neutro Abs: 15 10*3/uL — ABNORMAL HIGH (ref 1.7–7.7)
Neutrophils Relative %: 91 %
PLATELETS: 272 10*3/uL (ref 150–400)
RBC: 4.98 MIL/uL (ref 4.22–5.81)
RDW: 13.8 % (ref 11.5–15.5)
WBC: 16.6 10*3/uL — ABNORMAL HIGH (ref 4.0–10.5)

## 2016-01-21 LAB — GLUCOSE, CAPILLARY
GLUCOSE-CAPILLARY: 211 mg/dL — AB (ref 65–99)
Glucose-Capillary: 139 mg/dL — ABNORMAL HIGH (ref 65–99)
Glucose-Capillary: 154 mg/dL — ABNORMAL HIGH (ref 65–99)
Glucose-Capillary: 211 mg/dL — ABNORMAL HIGH (ref 65–99)
Glucose-Capillary: 142 mg/dL — ABNORMAL HIGH (ref 65–99)

## 2016-01-21 LAB — BASIC METABOLIC PANEL
Anion gap: 14 (ref 5–15)
BUN: 15 mg/dL (ref 6–20)
CALCIUM: 9.3 mg/dL (ref 8.9–10.3)
CO2: 21 mmol/L — AB (ref 22–32)
Chloride: 94 mmol/L — ABNORMAL LOW (ref 101–111)
Creatinine, Ser: 0.61 mg/dL (ref 0.61–1.24)
GFR calc Af Amer: >60 mL/min (ref >60)
GLUCOSE: 151 mg/dL — AB (ref 65–99)
Potassium: 4.5 mmol/L (ref 3.5–5.1)
Sodium: 129 mmol/L — ABNORMAL LOW (ref 135–145)

## 2016-01-21 MED ORDER — WHITE PETROLATUM GEL
Status: AC
  Filled 2016-01-21: qty 1

## 2016-01-21 NOTE — Progress Notes (Signed)
Patient ID: Chad Wang, male   DOB: October 09, 1956, 60 y.o.   MRN: [id number] [facility] PROGRESS NOTE  Chad Wang NOB:[id number] DOB: Feb 17, 1956 DOA: 01/18/2016 PCP: No PCP Per Patient   Brief narrative:    60 y.o. male presented to [facility] ED 03/22 with several months duration of progressively worsening cough, mixed with non productive and productive cough of yellowish sputum, competed several rounds of ABX but seems like symptoms initially get better and soon after d/c ABX, symptoms worse. This has been associated with hoarseness, some weight loss, poor oral intaje and malaise. He reports he quit smoking 1 weeks ago and has been using nicotine patch since.   In ED, pt had CTA of the chest which was negative for PE but did reveal collapse of LLL with abrupt termination of LLL bronchus suspicious for endobronchial lesion. Pt also had masslike consolidation of the LUL with adjacent pleural effusion and suspicious for mass lesion. In addition. He had diffuse emphysematous changes. Pt was admitted by [facility] and PCCM was called for further evaluation / consideration of bronchoscopy.  Assessment/Plan:    Principal Problem:   Dyspnea due to Mass of lower lobe of left lung - Presumed LLL endobronchial lesion with additional mass lesion and probable malignant effusion, ? CAP Status post bronchoscopy of the left mainstem, found to have a large polypoid mass epiglottis, partially obstructing the airway. Biopsy results pending. Seen by ENT they plan to do further procedures next week  - Continue empiric CAP coverage. - Supplemental O2 as needed to maintain SpO2 > 92%. - tobacco cessation counseling provided       Pulmonary emphysema ([place]) - provide BD's scheduled and as needed , continue IV Solu-Medrol - continue Empiric ABX Zithromax and Rocephin Slowly taper her IV Solu-Medrol Leukocytosis likely secondary to steroids    Hyponatremia/ siadh - improved with IVF but also possibly related  to malignant process  - continue to monitor     DM type 2 (diabetes mellitus, type 2) (HCC) - hold metformin for now as pt NPO - placed on SSI    Tobacco abuse - counseled on cessation     Essential hypertension - reasonable inpatient control   DVT prophylaxis - Lovenox SQ   Code Status: Full.  Family Communication:  plan of care discussed with the patient Disposition Plan: Home when cleared by PCCM, ENT   IV access:  Peripheral IV  Procedures and diagnostic studies:    Dg Chest 2 View 01/09/2016 New focal masslike opacity in the posterior left upper lung, cannot exclude pulmonary neoplasm. 2. Questionable mild thickening of the right paratracheal stripe, adenopathy not excluded.  Ct Angio Chest Pe W/cm &/or Wo Cm 01/18/2016   No evidence of significant pulmonary embolus. 2. Diffuse emphysematous changes in the lungs. Diffuse alveolar infiltration in the right lung is probably due to pneumonia but asymmetric edema could also have this appearance. 3. Collapse of the left lower lung with abrupt termination of the left lower lobe bronchus, suspicious for endobronchial lesion. Consider bronchoscopy for further evaluation. 4. Masslike consolidation in the left upper lung with adjacent pleural effusion and tenting. This is highly suspicious for a mass lesion. 5. Esophageal dilatation likely due to dysmotility although distal stricture not excluded.   Medical Consultants:  PCCM  Other Consultants:  None  IAnti-Infectives:   Zithromax and Rocephin 3/22 -->  Reyne Dumas, MD  [facility] Pager [phone]  If 7PM-7AM, please contact night-coverage [email or web address] Password [email or web address] 01/21/2016, 2:51 PM   LOS: 3  days   HPI/Subjective: No events overnight.   Objective: Filed Vitals:   01/21/16 0343 01/21/16 0822 01/21/16 0826 01/21/16 1226  BP: 127/71  132/77   Pulse: 88  88   Temp: 98.5 F (36.9 C)  98.5 F (36.9 C)   TempSrc:   Oral   Resp: 22  22   Height:      Weight:       SpO2: 94% 93% 96% 88%    Intake/Output Summary (Last 24 hours) at 01/21/16 1451 Last data filed at 01/21/16 0900  Gross per 24 hour  Intake   1470 ml  Output   1800 ml  Net   -330 ml    Exam:   General:  Pt is alert, follows commands appropriately, not in acute distress  Cardiovascular: Regular rate and rhythm, S1/S2, no murmurs, no rubs, no gallops  Respiratory: course breath sounds with rhonchi at the left base   Abdomen: Soft, non tender, non distended, bowel sounds present, no guarding  Extremities: pulses DP and PT palpable bilaterally  Neuro: Grossly nonfocal  Data Reviewed: Basic Metabolic Panel:  Recent Labs Lab 01/18/16 1633 01/19/16 0701 01/20/16 0527 01/21/16 0449  NA 124* 130* 130* 129*  K 4.4 4.6 4.0 4.5  CL 90* 95* 96* 94*  CO2 '22 23 24 '$ 21*  GLUCOSE 152* 146* 157* 151*  BUN '9 6 12 15  '$ CREATININE 0.72 0.75 0.63 0.61  CALCIUM 9.3 9.4 9.3 9.3   Liver Function Tests:  Recent Labs Lab 01/19/16 0701  AST 18  ALT 12*  ALKPHOS 75  BILITOT 0.5  PROT 6.5  ALBUMIN 3.5   CBC:  Recent Labs Lab 01/18/16 1633 01/19/16 0701 01/20/16 0527 01/21/16 0449  WBC 10.2 10.3 14.4* 16.6*  NEUTROABS  --  9.4*  --  15.0*  HGB 14.5 15.4 13.8 14.2  HCT 40.3 42.4 38.4* 39.9  MCV 78.7 79.3 79.0 80.1  PLT 230 226 224 272   CBG:  Recent Labs Lab 01/20/16 1938 01/20/16 2350 01/21/16 0337 01/21/16 0746 01/21/16 1213  GLUCAP 255* 181* 139* 154* 211*    Recent Results (from the past 240 hour(s))  Culture, sputum-assessment     Status: None   Collection Time: 01/19/16  7:45 AM  Result Value Ref Range Status   Specimen Description SPUTUM  Final   Special Requests Normal  Final   Sputum evaluation   Final    MICROSCOPIC FINDINGS SUGGEST THAT THIS SPECIMEN IS NOT REPRESENTATIVE OF LOWER RESPIRATORY SECRETIONS. PLEASE RECOLLECT. Gram Stain Report Called to,Read Back By and Verified With: T HUBBARD,RN AT 0852 01/19/16 BY L BENFIELD    Report Status  01/19/2016 FINAL  Final     Scheduled Meds: . amLODipine  10 mg Oral Daily  . antiseptic oral rinse  7 mL Mouth Rinse BID  . azithromycin  500 mg Oral QHS  . cefTRIAXone (ROCEPHIN)  IV  1 g Intravenous Q24H  . cloZAPine  100 mg Oral QHS  . divalproex  500 mg Oral Daily   And  . divalproex  1,000 mg Oral QHS  . enoxaparin (LOVENOX) injection  40 mg Subcutaneous Q24H  . feeding supplement (GLUCERNA SHAKE)  237 mL Oral BID BM  . gemfibrozil  600 mg Oral BID AC  . insulin aspart  0-9 Units Subcutaneous 6 times per day  . ipratropium-albuterol  3 mL Nebulization QID  . methylPREDNISolone (SOLU-MEDROL) injection  40 mg Intravenous BID  . nicotine  21 mg Transdermal Daily  . pantoprazole  40 mg Oral Daily   Continuous Infusions: . sodium chloride 10 mL/hr at 01/20/16 0710

## 2016-01-21 NOTE — Progress Notes (Signed)
1 Day Post-Op  Subjective: He feels his breathing is a bit better.  Swallowing well.  Objective: Vital signs in last 24 hours: Temp:  [98.1 F (36.7 C)-98.5 F (36.9 C)] 98.5 F (36.9 C) (03/25 0826) Pulse Rate:  [85-97] 88 (03/25 0826) Resp:  [18-22] 22 (03/25 0826) BP: (127-153)/(71-85) 132/77 mmHg (03/25 0826) SpO2:  [93 %-98 %] 96 % (03/25 0826) FiO2 (%):  [28 %] 28 % (03/24 1525) Weight:  [73.4 kg (161 lb 13.1 oz)] 73.4 kg (161 lb 13.1 oz) (03/24 1937) Last BM Date: 01/18/16  Intake/Output from previous day: 03/24 0701 - 03/25 0700 In: 1710 [P.O.:1660; IV Piggyback:50] Out: 1300 [Urine:1300] Intake/Output this shift: Total I/O In: 240 [P.O.:240] Out: 500 [Urine:500]  General appearance: alert, cooperative, no distress and no stridor, normal breathing rate, voice with muffled, low-pitch hoarseness  Lab Results:   Recent Labs  01/20/16 0527 01/21/16 0449  WBC 14.4* 16.6*  HGB 13.8 14.2  HCT 38.4* 39.9  PLT 224 272   BMET  Recent Labs  01/20/16 0527 01/21/16 0449  NA 130* 129*  K 4.0 4.5  CL 96* 94*  CO2 24 21*  GLUCOSE 157* 151*  BUN 12 15  CREATININE 0.63 0.61  CALCIUM 9.3 9.3   PT/INR No results for input(s): LABPROT, INR in the last 72 hours. ABG No results for input(s): PHART, HCO3 in the last 72 hours.  Invalid input(s): PCO2, PO2  Studies/Results: Ct Soft Tissue Neck W Contrast  01/20/2016  CLINICAL DATA:  60 year old male with hoarseness since February. Laryngeal mass. Subsequent encounter. EXAM: CT NECK WITH CONTRAST TECHNIQUE: Multidetector CT imaging of the neck was performed using the standard protocol following the bolus administration of intravenous contrast. CONTRAST:  28m OMNIPAQUE IOHEXOL 300 MG/ML  SOLN COMPARISON:  Head CT without contrast 12/13/2012. Chest CTA 01/18/2016. FINDINGS: Pharynx and larynx: Enhancing rounded mildly lobulated supraglottic laryngeal mass, arising from the base of the epiglottis near midline but eccentric  to the right. The lesion encompasses 18 x 17 x 23 mm (AP by transverse by CC). See series 21, image 63 and sagittal image 66. The right aryepiglottic fold appears invaded on series 201, image 65. The false cords appear relatively spared. The true cords are unaffected. The tumor does not involve the laryngeal cartilages but does track into the pre epiglottic fat. Pharyngeal soft tissue contours are within normal limits. Negative parapharyngeal and retropharyngeal spaces. Salivary glands: Negative sublingual space, submandibular glands and parotid glands. Thyroid: Negative. Lymph nodes: Right side level 2 and level 3 lymph nodes are small, measuring 5-6 mm short axis, but are larger than those on the left. Compare sagittal images 44 and 45 on the right to images 87 and 88 on the left. There is also a conspicuous right level 4 lymph node measuring 7 mm short axis on series 21, image 89. No no larger nodes. Normal level 1 nodes and level 5 nodes. No cystic or necrotic nodes. Vascular: Major vascular structures in the neck and at the skullbase are patent. Calcified atherosclerosis at the skull base. Limited intracranial: Negative. Visualized orbits: Negative. Mastoids and visualized paranasal sinuses: Clear. Skeleton: Scattered poor dentition. Lower cervical spine osseous disc and endplate degeneration. No acute osseous abnormality identified. Upper chest: Stable from recent chest CTA (please see that report). IMPRESSION: 1. 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 Invasion of the right AE fold suspected. No laryngeal cartilage or paralaryngeal soft tissue  involvement. 2. Small but asymmetrically conspicuous right level 2 through level 4 lymph nodes, up to 7 mm short axis. Early nodal metastatic disease not excluded. 3. Abnormal upper chest as reported on 01/18/2016 (please see that report). 4. Scattered poor dentition. Electronically Signed   By:  Genevie Ann M.D.   On: 01/20/2016 16:38    Anti-infectives: Anti-infectives    Start     Dose/Rate Route Frequency Ordered Stop   01/20/16 2200  azithromycin (ZITHROMAX) tablet 500 mg     500 mg Oral Daily at bedtime 01/20/16 1201 01/25/16 2159   01/19/16 2200  cefTRIAXone (ROCEPHIN) 1 g in dextrose 5 % 50 mL IVPB     1 g 100 mL/hr over 30 Minutes Intravenous Every 24 hours 01/19/16 0045 01/26/16 2159   01/19/16 2200  azithromycin (ZITHROMAX) 500 mg in dextrose 5 % 250 mL IVPB  Status:  Discontinued     500 mg 250 mL/hr over 60 Minutes Intravenous Every 24 hours 01/19/16 0045 01/20/16 1201   01/18/16 2145  azithromycin (ZITHROMAX) tablet 500 mg     500 mg Oral  Once 01/18/16 2144 01/18/16 2211   01/18/16 2145  cefTRIAXone (ROCEPHIN) 1 g in dextrose 5 % 50 mL IVPB     1 g 100 mL/hr over 30 Minutes Intravenous  Once 01/18/16 2144 01/18/16 2240      Assessment/Plan: 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  He is scheduled for procedure Wednesday.  Recommend inpatient observation until that time assuming no worsening of symptoms.  LOS: 3 days    Zenobia Kuennen 01/21/2016

## 2016-01-22 ENCOUNTER — Encounter (HOSPITAL_COMMUNITY): Payer: Self-pay | Admitting: Pulmonary Disease

## 2016-01-22 LAB — GLUCOSE, CAPILLARY
GLUCOSE-CAPILLARY: 161 mg/dL — AB (ref 65–99)
GLUCOSE-CAPILLARY: 237 mg/dL — AB (ref 65–99)
Glucose-Capillary: 153 mg/dL — ABNORMAL HIGH (ref 65–99)
Glucose-Capillary: 225 mg/dL — ABNORMAL HIGH (ref 65–99)
Glucose-Capillary: 103 mg/dL — ABNORMAL HIGH (ref 65–99)
Glucose-Capillary: 254 mg/dL — ABNORMAL HIGH (ref 65–99)

## 2016-01-22 MED ORDER — PREDNISONE 10 MG PO TABS
60.0000 mg | ORAL_TABLET | Freq: Every day | ORAL | Status: AC
  Administered 2016-01-22 – 2016-01-26 (×5): 60 mg via ORAL
  Filled 2016-01-22 (×5): qty 1

## 2016-01-22 MED ORDER — LEVOFLOXACIN 500 MG PO TABS
500.0000 mg | ORAL_TABLET | Freq: Every day | ORAL | Status: DC
  Administered 2016-01-23 – 2016-01-27 (×5): 500 mg via ORAL
  Filled 2016-01-22 (×5): qty 1

## 2016-01-22 NOTE — Progress Notes (Signed)
Patient ID: Chad Wang, male   DOB: [birth year], 60 y.o.   MRN: [id number] [facility] PROGRESS NOTE  Chad Wang:[id number] DOB: 20-May-1956 DOA: 01/18/2016 PCP: No PCP Per Patient   Brief narrative:    60 y.o. ? Prior history of paranoid schizophrenia with multiple inpatient hospitalizations documented prior to 07/2014 Reflux Hypertension Hyperlipidemia   presented to [facility] ED 03/22 with several months duration of progressively worsening cough, mixed with non productive and productive cough of yellowish sputum, competed several rounds of ABX but seems like symptoms initially get better and soon after d/c ABX, symptoms worse. This has been associated with hoarseness, some weight loss, poor oral intake and malaise.  He reports he quit smoking 1 weeks ago and has been using nicotine patch since.   In ED, pt had CTA of the chest which was negative for PE but did reveal collapse of LLL with abrupt termination of LLL bronchus suspicious for endobronchial lesion.  Pt also had masslike consolidation of the LUL with adjacent pleural effusion and suspicious for mass lesion.  In addition.  He had diffuse emphysematous changes.  Pt was admitted by [facility] and PCCM was called for further evaluation / consideration of bronchoscopy.  Assessment/Plan:    Principal Problem:   Dyspnea due to Mass of lower lobe of left lung - Presumed LLL endobronchial lesion with additional mass lesion and probable malignant effusion, ? CAP Status post bronchoscopy of the left mainstem3/24/17 =, found to have a large polypoid mass epiglottis, partially obstructing the airway. Pathology pending. Seen by ENT they plan to do further procedures next week  - Narrow ceftriaxone/azithromycin 01/22/16-->Levaquin for CAP coverage. - Supplemental O2 as needed to maintain SpO2 > 92%. - tobacco cessation counseling provided  -tele d/c 3/26 -to ambulate around the until 3/26-RN to document if difficult, as patient  wishes to shower    Pulmonary emphysema ([place]) -  BD's scheduled and as needed -Added Spiriva 3/26 - transition IV Solu-Medrol --->prednisone 60 mg daily 3/26 - continue Empiric ABX Zithromax and Rocephin--I have transitioned his Abx to levaquin starting 3/27 Leukocytosis  secondary to steroids    Hyponatremia/ siadh-secondary to malignancy - improved with IVF but also possibly related to malignant process  -Bicarbonate today is 21-monitor in a.m. and consider replacement if drops further    DM type 2 (diabetes mellitus, type 2) (HCC) - hold metformin for now as pt NPO - placed on SSI    Tobacco abuse - counseled on cessation     Essential hypertension - reasonable inpatient control  -Continue amlodipine 10 mg daily  Paranoid schizophrenia Continue Clozaril 100 daily at bedtime, continue Depakote 500 a.m. 1000 p.m.  DVT prophylaxis - Lovenox SQ   Code Status: Full.  Family Communication:  plan of care discussed with the patient Disposition Plan: Home when cleared by PCCM, ENT   IV access:  Peripheral IV  Procedures and diagnostic studies:    Dg Chest 2 View 01/09/2016 New focal masslike opacity in the posterior left upper lung, cannot exclude pulmonary neoplasm. 2. Questionable mild thickening of the right paratracheal stripe, adenopathy not excluded.  Ct Angio Chest Pe W/cm &/or Wo Cm 01/18/2016   No evidence of significant pulmonary embolus. 2. Diffuse emphysematous changes in the lungs. Diffuse alveolar infiltration in the right lung is probably due to pneumonia but asymmetric edema could also have this appearance. 3. Collapse of the left lower lung with abrupt termination of the left lower lobe bronchus, suspicious for endobronchial lesion. Consider  bronchoscopy for further evaluation. 4. Masslike consolidation in the left upper lung with adjacent pleural effusion and tenting. This is highly suspicious for a mass lesion. 5. Esophageal dilatation likely due to dysmotility  although distal stricture not excluded.   Medical Consultants:  PCCM  Other Consultants:  None  IAnti-Infectives:   Zithromax and Rocephin 3/22 -->3/26 Levaquin 3/26--? 3/28  Verneita Griffes, MD [facility] ([facility]) [phone]   LOS: 4 days   HPI/Subjective:  Doing fair. Really wishes to have a shower. Asking about his monitors No nausea no vomiting no chest pain A couple of bouts of diarrhea today but no chills Note that the patient is on Glucerna twice a day between meals  Objective: Filed Vitals:   01/21/16 1724 01/21/16 1959 01/21/16 2053 01/22/16 0432  BP: 136/78  131/74 132/77  Pulse: 90 98 98 92  Temp: 98.1 F (36.7 C)  98.1 F (36.7 C) 98.4 F (36.9 C)  TempSrc: Oral  Oral Oral  Resp: '22 18 18 19  '$ Height:      Weight:   73.6 kg (162 lb 4.1 oz)   SpO2: 95% 95% 93% 95%    Intake/Output Summary (Last 24 hours) at 01/22/16 0905 Last data filed at 01/22/16 0640  Gross per 24 hour  Intake   1130 ml  Output    550 ml  Net    580 ml    Exam:   General:  Pt is alert, follows commands appropriately, not in acute distress  Cardiovascular: Regular rate and rhythm, S1/S2, no murmurs, no rubs, no gallops  Respiratory: course breath sounds with rhonchi at the left base   Abdomen: Soft, non tender, non distended, bowel sounds present, no guarding  Extremities: pulses DP and PT palpable bilaterally  Neuro: Grossly nonfocal-slightly tremulous, no other issues, ROM intact, moving all 4 limbs = but with a mild tremor  Data Reviewed: Basic Metabolic Panel:  Recent Labs Lab 01/18/16 1633 01/19/16 0701 01/20/16 0527 01/21/16 0449  NA 124* 130* 130* 129*  K 4.4 4.6 4.0 4.5  CL 90* 95* 96* 94*  CO2 '22 23 24 '$ 21*  GLUCOSE 152* 146* 157* 151*  BUN '9 6 12 15  '$ CREATININE 0.72 0.75 0.63 0.61  CALCIUM 9.3 9.4 9.3 9.3   Liver Function Tests:  Recent Labs Lab 01/19/16 0701  AST 18  ALT 12*  ALKPHOS 75  BILITOT 0.5  PROT 6.5  ALBUMIN 3.5   CBC:  Recent  Labs Lab 01/18/16 1633 01/19/16 0701 01/20/16 0527 01/21/16 0449  WBC 10.2 10.3 14.4* 16.6*  NEUTROABS  --  9.4*  --  15.0*  HGB 14.5 15.4 13.8 14.2  HCT 40.3 42.4 38.4* 39.9  MCV 78.7 79.3 79.0 80.1  PLT 230 226 224 272   CBG:  Recent Labs Lab 01/21/16 1628 01/21/16 2052 01/22/16 0238 01/22/16 0431 01/22/16 0806  GLUCAP 211* 142* 153* 225* 161*    Recent Results (from the past 240 hour(s))  Culture, sputum-assessment     Status: None   Collection Time: 01/19/16  7:45 AM  Result Value Ref Range Status   Specimen Description SPUTUM  Final   Special Requests Normal  Final   Sputum evaluation   Final    MICROSCOPIC FINDINGS SUGGEST THAT THIS SPECIMEN IS NOT REPRESENTATIVE OF LOWER RESPIRATORY SECRETIONS. PLEASE RECOLLECT. Gram Stain Report Called to,Read Back By and Verified With: T HUBBARD,RN AT 0852 01/19/16 BY L BENFIELD    Report Status 01/19/2016 FINAL  Final     Scheduled  Meds: . amLODipine  10 mg Oral Daily  . antiseptic oral rinse  7 mL Mouth Rinse BID  . azithromycin  500 mg Oral QHS  . cefTRIAXone (ROCEPHIN)  IV  1 g Intravenous Q24H  . cloZAPine  100 mg Oral QHS  . divalproex  500 mg Oral Daily   And  . divalproex  1,000 mg Oral QHS  . enoxaparin (LOVENOX) injection  40 mg Subcutaneous Q24H  . feeding supplement (GLUCERNA SHAKE)  237 mL Oral BID BM  . gemfibrozil  600 mg Oral BID AC  . insulin aspart  0-9 Units Subcutaneous 6 times per day  . ipratropium-albuterol  3 mL Nebulization QID  . methylPREDNISolone (SOLU-MEDROL) injection  40 mg Intravenous BID  . nicotine  21 mg Transdermal Daily  . pantoprazole  40 mg Oral Daily   Continuous Infusions: . sodium chloride 10 mL/hr at 01/20/16 0710

## 2016-01-22 NOTE — Progress Notes (Signed)
   01/22/16 2119  Oxygen Therapy/Pulse Ox  O2 Device Room Air  O2 Therapy Room air  SpO2 (!) 78 % (pt had just got up to brush teeth, sats 78% without oxygen)  Pt had gotten up to brush his teeth without oxygen. RT placed oxygen back on at 3 LPM after breathing treatment

## 2016-01-22 NOTE — Progress Notes (Signed)
2 Days Post-Op  Subjective: Feeling pretty good.  Breathing is stable.  Swallowing well.  Objective: Vital signs in last 24 hours: Temp:  [97.9 F (36.6 C)-98.4 F (36.9 C)] 97.9 F (36.6 C) (03/26 0857) Pulse Rate:  [88-98] 88 (03/26 0857) Resp:  [18-22] 20 (03/26 0857) BP: (128-136)/(74-78) 128/78 mmHg (03/26 0857) SpO2:  [93 %-96 %] 93 % (03/26 1155) Weight:  [73.6 kg (162 lb 4.1 oz)] 73.6 kg (162 lb 4.1 oz) (03/25 2053) Last BM Date: 01/18/16  Intake/Output from previous day: 03/25 0701 - 03/26 0700 In: 1370 [P.O.:1320; IV Piggyback:50] Out: 1050 [Urine:1050] Intake/Output this shift: Total I/O In: 240 [P.O.:240] Out: -   General appearance: alert, cooperative, no distress and no stridor, stable muffled voice  Lab Results:   Recent Labs  01/20/16 0527 01/21/16 0449  WBC 14.4* 16.6*  HGB 13.8 14.2  HCT 38.4* 39.9  PLT 224 272   BMET  Recent Labs  01/20/16 0527 01/21/16 0449  NA 130* 129*  K 4.0 4.5  CL 96* 94*  CO2 24 21*  GLUCOSE 157* 151*  BUN 12 15  CREATININE 0.63 0.61  CALCIUM 9.3 9.3   PT/INR No results for input(s): LABPROT, INR in the last 72 hours. ABG No results for input(s): PHART, HCO3 in the last 72 hours.  Invalid input(s): PCO2, PO2  Studies/Results: Ct Soft Tissue Neck W Contrast  01/20/2016  CLINICAL DATA:  60 year old male with hoarseness since February. Laryngeal mass. Subsequent encounter. EXAM: CT NECK WITH CONTRAST TECHNIQUE: Multidetector CT imaging of the neck was performed using the standard protocol following the bolus administration of intravenous contrast. CONTRAST:  78m OMNIPAQUE IOHEXOL 300 MG/ML  SOLN COMPARISON:  Head CT without contrast 12/13/2012. Chest CTA 01/18/2016. 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 The lesion encompasses 18 x 17 x 23 mm (AP by transverse by CC). See series  21, image 63 and sagittal image 66. The right aryepiglottic fold appears invaded on series 201, image 65. The false cords appear relatively spared. The true cords are unaffected. The tumor does not involve the laryngeal cartilages but does track into the pre epiglottic fat. Pharyngeal soft tissue contours are within normal limits. Negative parapharyngeal and retropharyngeal spaces. Salivary glands: Negative sublingual space, submandibular glands and parotid glands. Thyroid: Negative. Lymph nodes: Right side level 2 and level 3 lymph nodes are small, measuring 5-6 mm short axis, but are larger than those on the left. Compare sagittal images 44 and 45 on the right to images 87 and 88 on the left. There is also a conspicuous right level 4 lymph node measuring 7 mm short axis on series 21, image 89. No no larger nodes. Normal level 1 nodes and level 5 nodes. No cystic or necrotic nodes. Vascular: Major vascular structures in the neck and at the skullbase are patent. Calcified atherosclerosis at the skull base. Limited intracranial: Negative. Visualized orbits: Negative. Mastoids and visualized paranasal sinuses: Clear. Skeleton: Scattered poor dentition. Lower cervical spine osseous disc and endplate degeneration. No acute osseous abnormality identified. Upper chest: Stable from recent chest CTA (please see that report). IMPRESSION: 1. 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 Invasion of the right AE fold suspected. No laryngeal cartilage or paralaryngeal soft tissue involvement. 2. Small but asymmetrically conspicuous right level 2 through level 4 lymph nodes, up  to 7 mm short axis. Early nodal metastatic disease not excluded. 3. Abnormal upper chest as reported on 01/18/2016 (please see that report). 4. Scattered poor dentition. Electronically Signed   By: Genevie Ann M.D.   On: 01/20/2016 16:38    Anti-infectives: Anti-infectives    Start      Dose/Rate Route Frequency Ordered Stop   01/23/16 1000  levofloxacin (LEVAQUIN) tablet 500 mg     500 mg Oral Daily 01/22/16 1101     01/20/16 2200  azithromycin (ZITHROMAX) tablet 500 mg  Status:  Discontinued     500 mg Oral Daily at bedtime 01/20/16 1201 01/22/16 1101   01/19/16 2200  cefTRIAXone (ROCEPHIN) 1 g in dextrose 5 % 50 mL IVPB  Status:  Discontinued     1 g 100 mL/hr over 30 Minutes Intravenous Every 24 hours 01/19/16 0045 01/22/16 1101   01/19/16 2200  azithromycin (ZITHROMAX) 500 mg in dextrose 5 % 250 mL IVPB  Status:  Discontinued     500 mg 250 mL/hr over 60 Minutes Intravenous Every 24 hours 01/19/16 0045 01/20/16 1201   01/18/16 2145  azithromycin (ZITHROMAX) tablet 500 mg     500 mg Oral  Once 01/18/16 2144 01/18/16 2211   01/18/16 2145  cefTRIAXone (ROCEPHIN) 1 g in dextrose 5 % 50 mL IVPB     1 g 100 mL/hr over 30 Minutes Intravenous  Once 01/18/16 2144 01/18/16 2240      Assessment/Plan: Laryngeal mass with hoarseness and shortness of breath s/p Procedure(s): VIDEO BRONCHOSCOPY WITHOUT FLUORO (Bilateral) Stable.  Plan surgical biopsy, possible tracheostomy Wednesday.  LOS: 4 days    Zanyah Lentsch 01/22/2016

## 2016-01-23 LAB — CBC WITH DIFFERENTIAL/PLATELET
BASOS ABS: 0 10*3/uL (ref 0.0–0.1)
BASOS PCT: 0 %
EOS ABS: 0 10*3/uL (ref 0.0–0.7)
Eosinophils Relative: 0 %
HCT: 40.4 % (ref 39.0–52.0)
HEMOGLOBIN: 14.7 g/dL (ref 13.0–17.0)
Lymphocytes Relative: 22 %
Lymphs Abs: 2.7 10*3/uL (ref 0.7–4.0)
MCH: 29.1 pg (ref 26.0–34.0)
MCHC: 36.4 g/dL — AB (ref 30.0–36.0)
MCV: 80 fL (ref 78.0–100.0)
Monocytes Absolute: 1.1 10*3/uL — ABNORMAL HIGH (ref 0.1–1.0)
Monocytes Relative: 9 %
NEUTROS PCT: 69 %
Neutro Abs: 8.7 10*3/uL — ABNORMAL HIGH (ref 1.7–7.7)
Platelets: 263 10*3/uL (ref 150–400)
RBC: 5.05 MIL/uL (ref 4.22–5.81)
RDW: 14.2 % (ref 11.5–15.5)
WBC: 12.6 10*3/uL — AB (ref 4.0–10.5)

## 2016-01-23 LAB — PROTIME-INR
INR: 1.25 (ref 0.00–1.49)
PROTHROMBIN TIME: 15.8 s — AB (ref 11.6–15.2)

## 2016-01-23 LAB — COMPREHENSIVE METABOLIC PANEL
ALBUMIN: 3.3 g/dL — AB (ref 3.5–5.0)
ALT: 15 U/L — ABNORMAL LOW (ref 17–63)
ANION GAP: 9 (ref 5–15)
AST: 16 U/L (ref 15–41)
Alkaline Phosphatase: 84 U/L (ref 38–126)
BILIRUBIN TOTAL: 0.2 mg/dL — AB (ref 0.3–1.2)
BUN: 14 mg/dL (ref 6–20)
CO2: 25 mmol/L (ref 22–32)
Calcium: 9.2 mg/dL (ref 8.9–10.3)
Chloride: 101 mmol/L (ref 101–111)
Creatinine, Ser: 0.71 mg/dL (ref 0.61–1.24)
GFR calc Af Amer: >60 mL/min (ref >60)
Glucose, Bld: 120 mg/dL — ABNORMAL HIGH (ref 65–99)
POTASSIUM: 4.3 mmol/L (ref 3.5–5.1)
Sodium: 135 mmol/L (ref 135–145)
TOTAL PROTEIN: 5.7 g/dL — AB (ref 6.5–8.1)

## 2016-01-23 LAB — GLUCOSE, CAPILLARY
GLUCOSE-CAPILLARY: 174 mg/dL — AB (ref 65–99)
GLUCOSE-CAPILLARY: 150 mg/dL — AB (ref 65–99)
GLUCOSE-CAPILLARY: 268 mg/dL — AB (ref 65–99)
Glucose-Capillary: 131 mg/dL — ABNORMAL HIGH (ref 65–99)
Glucose-Capillary: 155 mg/dL — ABNORMAL HIGH (ref 65–99)
Glucose-Capillary: 240 mg/dL — ABNORMAL HIGH (ref 65–99)
Glucose-Capillary: 148 mg/dL — ABNORMAL HIGH (ref 65–99)

## 2016-01-23 MED ORDER — IPRATROPIUM-ALBUTEROL 0.5-2.5 (3) MG/3ML IN SOLN
3.0000 mL | Freq: Three times a day (TID) | RESPIRATORY_TRACT | Status: DC
  Administered 2016-01-24 – 2016-01-26 (×6): 3 mL via RESPIRATORY_TRACT
  Filled 2016-01-23 (×7): qty 3

## 2016-01-23 NOTE — Progress Notes (Signed)
Patient ID: Chad Wang, male   DOB: 12-23-1955, 60 y.o.   MRN: [id number] [facility] PROGRESS NOTE  Chad Wang CBJ:[id number] DOB: 07-21-1956 DOA: 01/18/2016 PCP: No PCP Per Patient   Brief narrative:    60 y.o. male presented to [facility] ED 03/22 with several months duration of progressively worsening cough, mixed with non productive and productive cough of yellowish sputum, competed several rounds of ABX but seems like symptoms initially get better and soon after d/c ABX, symptoms worse. This has been associated with hoarseness, some weight loss, poor oral intaje and malaise. He reports he quit smoking 1 weeks ago and has been using nicotine patch since.   In ED, pt had CTA of the chest which was negative for PE but did reveal collapse of LLL with abrupt termination of LLL bronchus suspicious for endobronchial lesion. Pt also had masslike consolidation of the LUL with adjacent pleural effusion and suspicious for mass lesion. In addition. He had diffuse emphysematous changes. Pt was admitted by [facility] and PCCM was called for further evaluation / consideration of bronchoscopy.  Assessment/Plan:    Principal Problem:   Dyspnea due to Mass of lower lobe of left lung - Presumed LLL endobronchial lesion with additional mass lesion and probable malignant effusion, ? CAP Status post bronchoscopy of the left mainstem, found to have a large polypoid mass epiglottis, partially obstructing the airway. Biopsy results pending. Seen by ENT they plan to do surgical biopsy, possible tracheostomy Wednesday- Continue empiric CAP coverage. Currently on Levaquin started 3/26 - Supplemental O2 as needed to maintain SpO2 > 92%. - tobacco cessation counseling provided       Pulmonary emphysema ([place]) - provide BD's scheduled and as needed , changed IV Solu-Medrol to prednisone -Continue empiric CAP coverage. Currently on Levaquin started 3/26 Slowly taper her IV Solu-Medrol Leukocytosis likely  secondary to steroids    Hyponatremia/ siadh - improved with IVF but also possibly related to malignant process  - continue to monitor     DM type 2 (diabetes mellitus, type 2) (HCC) - hold metformin for now as pt NPO - placed on SSI    Tobacco abuse - counseled on cessation     Essential hypertension - reasonable inpatient control   Paranoid schizophrenia Continue Clozaril 100 daily at bedtime, continue Depakote 500 a.m. 1000 p.m Will check Depakote levels  DVT prophylaxis - Lovenox SQ   Code Status: Full.  Family Communication:  plan of care discussed with the patient Disposition Plan: Home when cleared by PCCM, ENT   IV access:  Peripheral IV  Procedures and diagnostic studies:    Dg Chest 2 View 01/09/2016 New focal masslike opacity in the posterior left upper lung, cannot exclude pulmonary neoplasm. 2. Questionable mild thickening of the right paratracheal stripe, adenopathy not excluded.  Ct Angio Chest Pe W/cm &/or Wo Cm 01/18/2016   No evidence of significant pulmonary embolus. 2. Diffuse emphysematous changes in the lungs. Diffuse alveolar infiltration in the right lung is probably due to pneumonia but asymmetric edema could also have this appearance. 3. Collapse of the left lower lung with abrupt termination of the left lower lobe bronchus, suspicious for endobronchial lesion. Consider bronchoscopy for further evaluation. 4. Masslike consolidation in the left upper lung with adjacent pleural effusion and tenting. This is highly suspicious for a mass lesion. 5. Esophageal dilatation likely due to dysmotility although distal stricture not excluded.   Medical Consultants:  PCCM  Other Consultants:  None  IAnti-Infectives:   Zithromax  and Rocephin 3/22 -->  Reyne Dumas, MD  [facility] Pager [phone]  If 7PM-7AM, please contact night-coverage [email or web address] Password [place] 01/23/2016, 8:41 AM   LOS: 5 days   HPI/Subjective: Appears comfortable, denies any  difficulty swallowing   Objective: Filed Vitals:   01/22/16 2133 01/23/16 0414 01/23/16 0747 01/23/16 0753  BP: 139/78 125/82  130/85  Pulse: 105 94  98  Temp: 97.8 F (36.6 C) 97.7 F (36.5 C)  97.5 F (36.4 C)  TempSrc: Oral Oral  Oral  Resp: '20 20  20  '$ Height:      Weight:      SpO2: 91% 94% 96% 96%    Intake/Output Summary (Last 24 hours) at 01/23/16 0841 Last data filed at 01/22/16 1319  Gross per 24 hour  Intake    360 ml  Output      0 ml  Net    360 ml    Exam:   General:  Pt is alert, follows commands appropriately, not in acute distress  Cardiovascular: Regular rate and rhythm, S1/S2, no murmurs, no rubs, no gallops  Respiratory: course breath sounds with rhonchi at the left base   Abdomen: Soft, non tender, non distended, bowel sounds present, no guarding  Extremities: pulses DP and PT palpable bilaterally  Neuro: Grossly nonfocal  Data Reviewed: Basic Metabolic Panel:  Recent Labs Lab 01/18/16 1633 01/19/16 0701 01/20/16 0527 01/21/16 0449  NA 124* 130* 130* 129*  K 4.4 4.6 4.0 4.5  CL 90* 95* 96* 94*  CO2 '22 23 24 '$ 21*  GLUCOSE 152* 146* 157* 151*  BUN '9 6 12 15  '$ CREATININE 0.72 0.75 0.63 0.61  CALCIUM 9.3 9.4 9.3 9.3   Liver Function Tests:  Recent Labs Lab 01/19/16 0701  AST 18  ALT 12*  ALKPHOS 75  BILITOT 0.5  PROT 6.5  ALBUMIN 3.5   CBC:  Recent Labs Lab 01/18/16 1633 01/19/16 0701 01/20/16 0527 01/21/16 0449 01/23/16 0728  WBC 10.2 10.3 14.4* 16.6* 12.6*  NEUTROABS  --  9.4*  --  15.0* 8.7*  HGB 14.5 15.4 13.8 14.2 14.7  HCT 40.3 42.4 38.4* 39.9 40.4  MCV 78.7 79.3 79.0 80.1 80.0  PLT 230 226 224 272 263   CBG:  Recent Labs Lab 01/22/16 1657 01/22/16 2130 01/23/16 0106 01/23/16 0413 01/23/16 0719  GLUCAP 254* 237* 174* 150* 131*    Recent Results (from the past 240 hour(s))  Culture, sputum-assessment     Status: None   Collection Time: 01/19/16  7:45 AM  Result Value Ref Range Status   Specimen  Description SPUTUM  Final   Special Requests Normal  Final   Sputum evaluation   Final    MICROSCOPIC FINDINGS SUGGEST THAT THIS SPECIMEN IS NOT REPRESENTATIVE OF LOWER RESPIRATORY SECRETIONS. PLEASE RECOLLECT. Gram Stain Report Called to,Read Back By and Verified With: T HUBBARD,RN AT 0852 01/19/16 BY L BENFIELD    Report Status 01/19/2016 FINAL  Final     Scheduled Meds: . amLODipine  10 mg Oral Daily  . antiseptic oral rinse  7 mL Mouth Rinse BID  . cloZAPine  100 mg Oral QHS  . divalproex  500 mg Oral Daily   And  . divalproex  1,000 mg Oral QHS  . enoxaparin (LOVENOX) injection  40 mg Subcutaneous Q24H  . feeding supplement (GLUCERNA SHAKE)  237 mL Oral BID BM  . gemfibrozil  600 mg Oral BID AC  . insulin aspart  0-9 Units Subcutaneous 6 times per day  .  ipratropium-albuterol  3 mL Nebulization QID  . levofloxacin  500 mg Oral Daily  . nicotine  21 mg Transdermal Daily  . pantoprazole  40 mg Oral Daily  . predniSONE  60 mg Oral QAC breakfast   Continuous Infusions: . sodium chloride 10 mL/hr at 01/20/16 0710

## 2016-01-23 NOTE — Progress Notes (Signed)
PCCM Attending Note: Per EMR. Patient planned for Trach & Biopsy by ENT on 3/29. PCCM will sign off. Please contact me if there are any further questions or we can be of any additional help.  Sonia Baller Ashok Cordia, M.D. [facility] Pager:  [phone] After 3pm or if no response, call [phone] 2:06 PM 01/23/2016

## 2016-01-23 NOTE — Care Management Important Message (Signed)
Important Message  Patient Details  Name: Chad Wang MRN: [id number] Date of Birth: 06-22-1956   Medicare Important Message Given:  Yes    Loann Quill 01/23/2016, 1:05 PM

## 2016-01-24 LAB — COMPREHENSIVE METABOLIC PANEL
ALT: 14 U/L — AB (ref 17–63)
ANION GAP: 10 (ref 5–15)
AST: 16 U/L (ref 15–41)
Albumin: 3.4 g/dL — ABNORMAL LOW (ref 3.5–5.0)
Alkaline Phosphatase: 80 U/L (ref 38–126)
BUN: 14 mg/dL (ref 6–20)
CHLORIDE: 98 mmol/L — AB (ref 101–111)
CO2: 27 mmol/L (ref 22–32)
CREATININE: 0.7 mg/dL (ref 0.61–1.24)
Calcium: 9.4 mg/dL (ref 8.9–10.3)
Glucose, Bld: 102 mg/dL — ABNORMAL HIGH (ref 65–99)
Potassium: 5 mmol/L (ref 3.5–5.1)
SODIUM: 135 mmol/L (ref 135–145)
Total Bilirubin: 0.4 mg/dL (ref 0.3–1.2)
Total Protein: 5.7 g/dL — ABNORMAL LOW (ref 6.5–8.1)

## 2016-01-24 LAB — GLUCOSE, CAPILLARY
GLUCOSE-CAPILLARY: 98 mg/dL (ref 65–99)
GLUCOSE-CAPILLARY: 288 mg/dL — AB (ref 65–99)
Glucose-Capillary: 115 mg/dL — ABNORMAL HIGH (ref 65–99)
Glucose-Capillary: 127 mg/dL — ABNORMAL HIGH (ref 65–99)
Glucose-Capillary: 161 mg/dL — ABNORMAL HIGH (ref 65–99)
Glucose-Capillary: 165 mg/dL — ABNORMAL HIGH (ref 65–99)
Glucose-Capillary: 211 mg/dL — ABNORMAL HIGH (ref 65–99)

## 2016-01-24 LAB — CBC
HCT: 43.5 % (ref 39.0–52.0)
Hemoglobin: 15 g/dL (ref 13.0–17.0)
MCH: 28.2 pg (ref 26.0–34.0)
MCHC: 34.5 g/dL (ref 30.0–36.0)
MCV: 81.8 fL (ref 78.0–100.0)
PLATELETS: 262 10*3/uL (ref 150–400)
RBC: 5.32 MIL/uL (ref 4.22–5.81)
RDW: 14.3 % (ref 11.5–15.5)
WBC: 13.4 10*3/uL — ABNORMAL HIGH (ref 4.0–10.5)

## 2016-01-24 LAB — VALPROIC ACID LEVEL: VALPROIC ACID LVL: 62 ug/mL (ref 50.0–100.0)

## 2016-01-24 NOTE — Progress Notes (Signed)
Chaplain responded to the request of the Nursing Unit to provide spiritual care support for the patient and to help patient complete an Advance Directive. The Directive was completed with witnesses, and Notary, a copy was placed in the patient's file, and two copies were presented to his 85, which is his sister. A prayer of healing was offered for the patient. Chaplain will follow up as needed. Chaplain Yaakov Guthrie [phone]

## 2016-01-24 NOTE — Progress Notes (Signed)
Patient ID: Chad Wang, male   DOB: April 08, 1956, 60 y.o.   MRN: [id number] [facility] PROGRESS NOTE  Chad Wang GEX:[id number] DOB: [birth year] DOA: 01/18/2016 PCP: No PCP Per Patient   Brief narrative:    60 y.o. male presented to [facility] ED 03/22 with several months duration of progressively worsening cough, mixed with non productive and productive cough of yellowish sputum, competed several rounds of ABX but seems like symptoms initially get better and soon after d/c ABX, symptoms worse. This has been associated with hoarseness, some weight loss, poor oral intaje and malaise. He reports he quit smoking 1 weeks ago and has been using nicotine patch since.   In ED, pt had CTA of the chest which was negative for PE but did reveal collapse of LLL with abrupt termination of LLL bronchus suspicious for endobronchial lesion. Pt also had masslike consolidation of the LUL with adjacent pleural effusion and suspicious for mass lesion. In addition. He had diffuse emphysematous changes. Pt was admitted by [facility] and PCCM was called for further evaluation / consideration of bronchoscopy.  Assessment/Plan:    Principal Problem:   Dyspnea due to Mass of lower lobe of left lung - Presumed LLL endobronchial lesion with additional mass lesion and probable malignant effusion, ? CAP Status post bronchoscopy of the left mainstem, found to have a large polypoid mass epiglottis, partially obstructing the airway. Biopsy results pending. Seen by ENT they plan to do surgical biopsy, possible tracheostomy  3/29  Continue empiric CAP coverage. Currently on Levaquin started 3/26 - Supplemental O2 as needed to maintain SpO2 > 92%. Stable on 3 L - tobacco cessation counseling provided       Pulmonary emphysema ([place]) - provide BD's scheduled and as needed , continue prednisone -Continue empiric CAP coverage. Currently on Levaquin started 3/26 Slowly taper her IV Solu-Medrol Leukocytosis likely secondary to  steroids    Hyponatremia/ siadh - improved with IVF but also possibly related to malignant process  - continue to monitor     DM type 2 (diabetes mellitus, type 2) (HCC) - hold metformin , Accu-Cheks stable  - placed on SSI    Tobacco abuse - counseled on cessation     Essential hypertension - reasonable inpatient control   Paranoid schizophrenia Continue Clozaril 100 daily at bedtime, continue Depakote 500 a.m. 1000 p.m Will check Depakote levels   DVT prophylaxis - Lovenox SQ   Code Status: Full.  Family Communication:  plan of care discussed with the patient Disposition Plan: Home when cleared by PCCM, ENT   IV access:  Peripheral IV  Procedures and diagnostic studies:    Dg Chest 2 View 01/09/2016 New focal masslike opacity in the posterior left upper lung, cannot exclude pulmonary neoplasm. 2. Questionable mild thickening of the right paratracheal stripe, adenopathy not excluded.  Ct Angio Chest Pe W/cm &/or Wo Cm 01/18/2016   No evidence of significant pulmonary embolus. 2. Diffuse emphysematous changes in the lungs. Diffuse alveolar infiltration in the right lung is probably due to pneumonia but asymmetric edema could also have this appearance. 3. Collapse of the left lower lung with abrupt termination of the left lower lobe bronchus, suspicious for endobronchial lesion. Consider bronchoscopy for further evaluation. 4. Masslike consolidation in the left upper lung with adjacent pleural effusion and tenting. This is highly suspicious for a mass lesion. 5. Esophageal dilatation likely due to dysmotility although distal stricture not excluded.   Medical Consultants:  PCCM  Other Consultants:  None  IAnti-Infectives:  Zithromax and Rocephin 3/22 -->  Reyne Dumas, MD  [facility] Pager [phone]  If 7PM-7AM, please contact night-coverage [email or web address] Password [facility] 01/24/2016, 2:01 PM   LOS: 6 days   HPI/Subjective: Appears comfortable, denies any difficulty  swallowing   Objective: Filed Vitals:   01/23/16 2145 01/24/16 0417 01/24/16 0828 01/24/16 0947  BP:  133/93 116/74   Pulse:  99 103   Temp:  97.7 F (36.5 C) 97.6 F (36.4 C)   TempSrc:  Axillary Oral   Resp:  18 17   Height:      Weight:      SpO2: 98% 95% 97% 96%    Intake/Output Summary (Last 24 hours) at 01/24/16 1401 Last data filed at 01/24/16 0900  Gross per 24 hour  Intake    720 ml  Output    603 ml  Net    117 ml    Exam:   General:  Pt is alert, follows commands appropriately, not in acute distress  Cardiovascular: Regular rate and rhythm, S1/S2, no murmurs, no rubs, no gallops  Respiratory: course breath sounds with rhonchi at the left base   Abdomen: Soft, non tender, non distended, bowel sounds present, no guarding  Extremities: pulses DP and PT palpable bilaterally  Neuro: Grossly nonfocal  Data Reviewed: Basic Metabolic Panel:  Recent Labs Lab 01/19/16 0701 01/20/16 0527 01/21/16 0449 01/23/16 0728 01/24/16 0510  NA 130* 130* 129* 135 135  K 4.6 4.0 4.5 4.3 5.0  CL 95* 96* 94* 101 98*  CO2 23 24 21* 25 27  GLUCOSE 146* 157* 151* 120* 102*  BUN '6 12 15 14 14  '$ CREATININE 0.75 0.63 0.61 0.71 0.70  CALCIUM 9.4 9.3 9.3 9.2 9.4   Liver Function Tests:  Recent Labs Lab 01/19/16 0701 01/23/16 0728 01/24/16 0510  AST '18 16 16  '$ ALT 12* 15* 14*  ALKPHOS 75 84 80  BILITOT 0.5 0.2* 0.4  PROT 6.5 5.7* 5.7*  ALBUMIN 3.5 3.3* 3.4*   CBC:  Recent Labs Lab 01/19/16 0701 01/20/16 0527 01/21/16 0449 01/23/16 0728 01/24/16 0510  WBC 10.3 14.4* 16.6* 12.6* 13.4*  NEUTROABS 9.4*  --  15.0* 8.7*  --   HGB 15.4 13.8 14.2 14.7 15.0  HCT 42.4 38.4* 39.9 40.4 43.5  MCV 79.3 79.0 80.1 80.0 81.8  PLT 226 224 272 263 262   CBG:  Recent Labs Lab 01/23/16 2007 01/23/16 2217 01/24/16 0043 01/24/16 0414 01/24/16 0749  GLUCAP 240* 148* 161* 115* 98    Recent Results (from the past 240 hour(s))  Culture, sputum-assessment     Status:  None   Collection Time: 01/19/16  7:45 AM  Result Value Ref Range Status   Specimen Description SPUTUM  Final   Special Requests Normal  Final   Sputum evaluation   Final    MICROSCOPIC FINDINGS SUGGEST THAT THIS SPECIMEN IS NOT REPRESENTATIVE OF LOWER RESPIRATORY SECRETIONS. PLEASE RECOLLECT. Gram Stain Report Called to,Read Back By and Verified With: T HUBBARD,RN AT 0852 01/19/16 BY L BENFIELD    Report Status 01/19/2016 FINAL  Final     Scheduled Meds: . amLODipine  10 mg Oral Daily  . antiseptic oral rinse  7 mL Mouth Rinse BID  . cloZAPine  100 mg Oral QHS  . divalproex  500 mg Oral Daily   And  . divalproex  1,000 mg Oral QHS  . enoxaparin (LOVENOX) injection  40 mg Subcutaneous Q24H  . feeding supplement (GLUCERNA SHAKE)  237 mL Oral BID BM  .  gemfibrozil  600 mg Oral BID AC  . insulin aspart  0-9 Units Subcutaneous 6 times per day  . ipratropium-albuterol  3 mL Nebulization TID  . levofloxacin  500 mg Oral Daily  . nicotine  21 mg Transdermal Daily  . pantoprazole  40 mg Oral Daily  . predniSONE  60 mg Oral QAC breakfast   Continuous Infusions: . sodium chloride 10 mL/hr at 01/20/16 0710

## 2016-01-25 ENCOUNTER — Inpatient Hospital Stay (HOSPITAL_COMMUNITY): Payer: Medicare Other | Admitting: Critical Care Medicine

## 2016-01-25 ENCOUNTER — Encounter (HOSPITAL_COMMUNITY): Payer: Self-pay | Admitting: Critical Care Medicine

## 2016-01-25 ENCOUNTER — Encounter (HOSPITAL_COMMUNITY)
Admission: EM | Disposition: A | Discharge status: Skilled Nursing Facility | Payer: Self-pay | Source: Home / Self Care | Attending: Internal Medicine

## 2016-01-25 HISTORY — PX: MICROLARYNGOSCOPY WITH CO2 LASER AND EXCISION OF VOCAL CORD LESION: SHX5970

## 2016-01-25 LAB — GLUCOSE, CAPILLARY
GLUCOSE-CAPILLARY: 134 mg/dL — AB (ref 65–99)
GLUCOSE-CAPILLARY: 212 mg/dL — AB (ref 65–99)
GLUCOSE-CAPILLARY: 221 mg/dL — AB (ref 65–99)
GLUCOSE-CAPILLARY: 290 mg/dL — AB (ref 65–99)
GLUCOSE-CAPILLARY: 96 mg/dL (ref 65–99)
Glucose-Capillary: 144 mg/dL — ABNORMAL HIGH (ref 65–99)
Glucose-Capillary: 169 mg/dL — ABNORMAL HIGH (ref 65–99)

## 2016-01-25 LAB — CBC
HCT: 42.4 % (ref 39.0–52.0)
Hemoglobin: 14.6 g/dL (ref 13.0–17.0)
MCH: 27.9 pg (ref 26.0–34.0)
MCHC: 34.4 g/dL (ref 30.0–36.0)
MCV: 80.9 fL (ref 78.0–100.0)
Platelets: 251 10*3/uL (ref 150–400)
RBC: 5.24 MIL/uL (ref 4.22–5.81)
RDW: 14.1 % (ref 11.5–15.5)
WBC: 13.5 10*3/uL — ABNORMAL HIGH (ref 4.0–10.5)

## 2016-01-25 SURGERY — MICROLARYNGOSCOPY WITH CO2 LASER AND EXCISION OF VOCAL CORD LESION
Anesthesia: General

## 2016-01-25 MED ORDER — ONDANSETRON HCL 4 MG/2ML IJ SOLN
INTRAMUSCULAR | Status: DC | PRN
  Administered 2016-01-25: 4 mg via INTRAVENOUS

## 2016-01-25 MED ORDER — LACTATED RINGERS IV SOLN
INTRAVENOUS | Status: DC | PRN
  Administered 2016-01-25: 11:00:00 via INTRAVENOUS

## 2016-01-25 MED ORDER — MIDAZOLAM HCL 5 MG/5ML IJ SOLN
INTRAMUSCULAR | Status: DC | PRN
  Administered 2016-01-25: .5 mg via INTRAVENOUS
  Administered 2016-01-25: 1 mg via INTRAVENOUS
  Administered 2016-01-25: 0.5 mg via INTRAVENOUS

## 2016-01-25 MED ORDER — DEXAMETHASONE SODIUM PHOSPHATE 10 MG/ML IJ SOLN
INTRAMUSCULAR | Status: DC | PRN
  Administered 2016-01-25: 10 mg via INTRAVENOUS

## 2016-01-25 MED ORDER — PHENYLEPHRINE 40 MCG/ML (10ML) SYRINGE FOR IV PUSH (FOR BLOOD PRESSURE SUPPORT)
PREFILLED_SYRINGE | INTRAVENOUS | Status: AC
  Filled 2016-01-25: qty 10

## 2016-01-25 MED ORDER — PHENYLEPHRINE HCL 10 MG/ML IJ SOLN
INTRAMUSCULAR | Status: AC
  Filled 2016-01-25: qty 1

## 2016-01-25 MED ORDER — PROPOFOL 10 MG/ML IV BOLUS
INTRAVENOUS | Status: DC | PRN
  Administered 2016-01-25: 180 mg via INTRAVENOUS

## 2016-01-25 MED ORDER — ARTIFICIAL TEARS OP OINT
TOPICAL_OINTMENT | OPHTHALMIC | Status: AC
  Filled 2016-01-25: qty 3.5

## 2016-01-25 MED ORDER — SODIUM CHLORIDE 0.9 % IV SOLN
10.0000 mg | INTRAVENOUS | Status: DC | PRN
  Administered 2016-01-25: 50 ug/min via INTRAVENOUS

## 2016-01-25 MED ORDER — HYDROMORPHONE HCL 1 MG/ML IJ SOLN: 0.2500 mg | INTRAMUSCULAR | Status: DC | PRN

## 2016-01-25 MED ORDER — PROPOFOL 10 MG/ML IV BOLUS
INTRAVENOUS | Status: AC
  Filled 2016-01-25: qty 20

## 2016-01-25 MED ORDER — MIDAZOLAM HCL 2 MG/2ML IJ SOLN
INTRAMUSCULAR | Status: AC
  Filled 2016-01-25: qty 2

## 2016-01-25 MED ORDER — SODIUM CHLORIDE 0.9 % IJ SOLN
INTRAMUSCULAR | Status: AC
  Filled 2016-01-25: qty 10

## 2016-01-25 MED ORDER — LIDOCAINE HCL (CARDIAC) 20 MG/ML IV SOLN
INTRAVENOUS | Status: DC | PRN
  Administered 2016-01-25: 50 mg via INTRAVENOUS

## 2016-01-25 MED ORDER — EPINEPHRINE HCL 1 MG/ML IJ SOLN
INTRAMUSCULAR | Status: DC | PRN
  Administered 2016-01-25: 30 mL

## 2016-01-25 MED ORDER — ONDANSETRON HCL 4 MG/2ML IJ SOLN
INTRAMUSCULAR | Status: AC
  Filled 2016-01-25: qty 2

## 2016-01-25 MED ORDER — FENTANYL CITRATE (PF) 250 MCG/5ML IJ SOLN
INTRAMUSCULAR | Status: AC
  Filled 2016-01-25: qty 5

## 2016-01-25 MED ORDER — FENTANYL CITRATE (PF) 100 MCG/2ML IJ SOLN
INTRAMUSCULAR | Status: DC | PRN
  Administered 2016-01-25 (×2): 50 ug via INTRAVENOUS

## 2016-01-25 MED ORDER — LIDOCAINE-EPINEPHRINE 1 %-1:100000 IJ SOLN
INTRAMUSCULAR | Status: AC
  Filled 2016-01-25: qty 1

## 2016-01-25 MED ORDER — SUCCINYLCHOLINE CHLORIDE 20 MG/ML IJ SOLN
INTRAMUSCULAR | Status: DC | PRN
  Administered 2016-01-25: 100 mg via INTRAVENOUS

## 2016-01-25 MED ORDER — 0.9 % SODIUM CHLORIDE (POUR BTL) OPTIME
TOPICAL | Status: DC | PRN
  Administered 2016-01-25: 1000 mL

## 2016-01-25 MED ORDER — SUCCINYLCHOLINE CHLORIDE 20 MG/ML IJ SOLN
INTRAMUSCULAR | Status: AC
  Filled 2016-01-25: qty 1

## 2016-01-25 MED ORDER — HYDROCODONE-ACETAMINOPHEN 7.5-325 MG/15ML PO SOLN: 15.0000 mL | Freq: Four times a day (QID) | ORAL | Status: DC | PRN

## 2016-01-25 MED ORDER — EPINEPHRINE HCL (NASAL) 0.1 % NA SOLN
NASAL | Status: AC
  Filled 2016-01-25: qty 30

## 2016-01-25 MED ORDER — PHENYLEPHRINE HCL 10 MG/ML IJ SOLN
INTRAMUSCULAR | Status: DC | PRN
  Administered 2016-01-25: 120 ug via INTRAVENOUS
  Administered 2016-01-25: 80 ug via INTRAVENOUS
  Administered 2016-01-25: 160 ug via INTRAVENOUS

## 2016-01-25 SURGICAL SUPPLY — 40 items
BANDAGE EYE OVAL (MISCELLANEOUS) ×2 IMPLANT
BLADE SURG 15 STRL LF DISP TIS (BLADE) IMPLANT
BLADE SURG 15 STRL SS (BLADE) ×3
CANISTER SUCTION 2500CC (MISCELLANEOUS) ×3 IMPLANT
CONT SPEC 4OZ CLIKSEAL STRL BL (MISCELLANEOUS) ×2 IMPLANT
COVER MAYO STAND STRL (DRAPES) ×2 IMPLANT
COVER TABLE BACK 60X90 (DRAPES) ×3 IMPLANT
CRADLE DONUT ADULT HEAD (MISCELLANEOUS) ×2 IMPLANT
DECANTER SPIKE VIAL GLASS SM (MISCELLANEOUS) ×3 IMPLANT
DRAPE PROXIMA HALF (DRAPES) ×3 IMPLANT
ELECT COATED BLADE 2.86 ST (ELECTRODE) ×3 IMPLANT
ELECT REM PT RETURN 9FT ADLT (ELECTROSURGICAL) ×3
ELECTRODE REM PT RTRN 9FT ADLT (ELECTROSURGICAL) ×1 IMPLANT
GAUZE SPONGE 4X4 16PLY XRAY LF (GAUZE/BANDAGES/DRESSINGS) ×3 IMPLANT
GLOVE BIO SURGEON STRL SZ7.5 (GLOVE) ×3 IMPLANT
GLOVE BIOGEL PI IND STRL 7.0 (GLOVE) IMPLANT
GLOVE BIOGEL PI INDICATOR 7.0 (GLOVE) ×6
GLOVE ECLIPSE 7.0 STRL STRAW (GLOVE) ×2 IMPLANT
GOWN STRL REUS W/ TWL LRG LVL3 (GOWN DISPOSABLE) ×2 IMPLANT
GOWN STRL REUS W/TWL LRG LVL3 (GOWN DISPOSABLE) ×9
KIT BASIN OR (CUSTOM PROCEDURE TRAY) ×3 IMPLANT
KIT ROOM TURNOVER OR (KITS) ×3 IMPLANT
NDL HYPO 25GX1X1/2 BEV (NEEDLE) IMPLANT
NEEDLE HYPO 25GX1X1/2 BEV (NEEDLE) ×3 IMPLANT
NS IRRIG 1000ML POUR BTL (IV SOLUTION) ×3 IMPLANT
PACK EENT II TURBAN DRAPE (CUSTOM PROCEDURE TRAY) ×3 IMPLANT
PAD ARMBOARD 7.5X6 YLW CONV (MISCELLANEOUS) ×4 IMPLANT
PATTIES SURGICAL .5 X3 (DISPOSABLE) ×3 IMPLANT
PENCIL BUTTON HOLSTER BLD 10FT (ELECTRODE) ×3 IMPLANT
SOLUTION ANTI FOG 6CC (MISCELLANEOUS) ×2 IMPLANT
SPONGE DRAIN TRACH 4X4 STRL 2S (GAUZE/BANDAGES/DRESSINGS) ×1 IMPLANT
SUT SILK 0 FSL (SUTURE) ×3 IMPLANT
SUT SILK 2 0 SH CR/8 (SUTURE) ×3 IMPLANT
SYR 20ML ECCENTRIC (SYRINGE) ×3 IMPLANT
SYR BULB 3OZ (MISCELLANEOUS) ×3 IMPLANT
SYR CONTROL 10ML LL (SYRINGE) ×2 IMPLANT
TOWEL OR 17X24 6PK STRL BLUE (TOWEL DISPOSABLE) ×4 IMPLANT
TUBE CONNECTING 12'X1/4 (SUCTIONS) ×1
TUBE CONNECTING 12X1/4 (SUCTIONS) ×2 IMPLANT
WATER STERILE IRR 1000ML POUR (IV SOLUTION) ×3 IMPLANT

## 2016-01-25 NOTE — Anesthesia Procedure Notes (Signed)
Procedure Name: Intubation Date/Time: 01/25/2016 11:54 AM Performed by: Merrilyn Puma B Pre-anesthesia Checklist: Patient identified, Emergency Drugs available, Suction available, Patient being monitored and Timeout performed Patient Re-evaluated:Patient Re-evaluated prior to inductionOxygen Delivery Method: Circle system utilized Preoxygenation: Pre-oxygenation with 100% oxygen Intubation Type: IV induction Ventilation: Mask ventilation without difficulty Laryngoscope Size: Miller and 3 Grade View: Grade II Tube type: Oral Laser Tube: Laser Tube and Cuffed inflated with minimal occlusive pressure - saline Tube size: 6.0 mm Number of attempts: 1 Airway Equipment and Method: Stylet Placement Confirmation: ETT inserted through vocal cords under direct vision,  positive ETCO2 and breath sounds checked- equal and bilateral Tube secured with: Tape Dental Injury: Teeth and Oropharynx as per pre-operative assessment  Comments: Intubation performed by Dr. Redmond Baseman

## 2016-01-25 NOTE — Progress Notes (Signed)
Remains stable from breathing standpoint.  Voice remains muffled somewhat. AF VSS Alert, NAD No stridor, voice somewhat muffled Neck: No mass A/P: Laryngeal mass with hoarseness and some shortness of breath To OR for microlaryngoscopy with CO2 laser biopsy/debulking, possible tracheostomy.

## 2016-01-25 NOTE — Brief Op Note (Signed)
01/18/2016 - 01/25/2016  12:25 PM  PATIENT:  Theo Dills  60 y.o. male  PRE-OPERATIVE DIAGNOSIS:  laryngeal mass  POST-OPERATIVE DIAGNOSIS:  same  PROCEDURE:  Procedure(s): MICROLARYNGOSCOPY WITH CO2 LASER, DEBULKING OF LARYNX MASS  (N/A)  SURGEON:  Surgeon(s) and Role:    * Melida Quitter, MD - Primary  PHYSICIAN ASSISTANT:   ASSISTANTS: none   ANESTHESIA:   general  EBL:     BLOOD ADMINISTERED:none  DRAINS: none   LOCAL MEDICATIONS USED:  NONE  SPECIMEN:  Source of Specimen:  Laryngeal surface of epiglottis mass, left anterior commissure  DISPOSITION OF SPECIMEN:  PATHOLOGY  COUNTS:  YES  TOURNIQUET:  * No tourniquets in log *  DICTATION: .Other Dictation: Dictation Number [id number]  PLAN OF CARE: Return to hospital room  PATIENT DISPOSITION:  PACU - hemodynamically stable.   Delay start of Pharmacological VTE agent (>24hrs) due to surgical blood loss or risk of bleeding: no

## 2016-01-25 NOTE — Progress Notes (Addendum)
Patient ID: Chad Wang, male   DOB: [birth year], 60 y.o.   MRN: [id number] [facility] PROGRESS NOTE  Chad Wang EXH:[id number] DOB: 1956/04/10 DOA: 01/18/2016 PCP: No PCP Per Patient   Brief narrative:    60 y.o. male presented to [facility] ED 03/22 with several months duration of progressively worsening cough, mixed with non productive and productive cough of yellowish sputum, competed several rounds of ABX but seems like symptoms initially get better and soon after d/c ABX, symptoms worse. This has been associated with hoarseness, some weight loss, poor oral intaje and malaise. He reports he quit smoking 1 weeks ago and has been using nicotine patch since.   In ED, pt had CTA of the chest which was negative for PE but did reveal collapse of LLL with abrupt termination of LLL bronchus suspicious for endobronchial lesion. Pt also had masslike consolidation of the LUL with adjacent pleural effusion and suspicious for mass lesion. In addition. He had diffuse emphysematous changes. Pt was admitted by [facility] and PCCM was called for further evaluation / consideration of bronchoscopy.  Assessment/Plan:    Principal Problem:   Dyspnea due to Mass of lower lobe of left lung - Presumed LLL endobronchial lesion with additional mass lesion and probable malignant effusion, ? CAP Status post bronchoscopy of the left mainstem, found to have a large polypoid mass epiglottis, partially obstructing the airway. Biopsy results pending.   surgical biopsy, possible tracheostomy  3/29, Today  Continue empiric CAP coverage. Currently on Levaquin started 3/26 - Supplemental O2 as needed to maintain SpO2 > 92%. Stable on 3 L - tobacco cessation counseling provided       Pulmonary emphysema ([place]) - provide BD's scheduled and as needed , continue prednisone -Continue empiric CAP coverage. Currently on Levaquin started 3/26 Discontinued IV Solu-Medrol Leukocytosis likely secondary to steroids, Start  tapering    Hyponatremia/ siadh - improved with IVF but also possibly related to malignant process  - continue to monitor     DM type 2 (diabetes mellitus, type 2) (HCC) - hold metformin , Accu-Cheks stable  - placed on SSI    Tobacco abuse - counseled on cessation     Essential hypertension - reasonable inpatient control   Paranoid schizophrenia Continue Clozaril 100 daily at bedtime, continue Depakote 500 a.m. 1000 p.m   Depakote levels Therapeutic   DVT prophylaxis - Lovenox SQ   Code Status: Full.  Family Communication:  plan of care discussed with the patient Disposition Plan: anticipate patient to discharge in 2-3 days whenever stable from ENT standpoint    IV access:  Peripheral IV  Procedures and diagnostic studies:    Dg Chest 2 View 01/09/2016 New focal masslike opacity in the posterior left upper lung, cannot exclude pulmonary neoplasm. 2. Questionable mild thickening of the right paratracheal stripe, adenopathy not excluded.  Ct Angio Chest Pe W/cm &/or Wo Cm 01/18/2016   No evidence of significant pulmonary embolus. 2. Diffuse emphysematous changes in the lungs. Diffuse alveolar infiltration in the right lung is probably due to pneumonia but asymmetric edema could also have this appearance. 3. Collapse of the left lower lung with abrupt termination of the left lower lobe bronchus, suspicious for endobronchial lesion. Consider bronchoscopy for further evaluation. 4. Masslike consolidation in the left upper lung with adjacent pleural effusion and tenting. This is highly suspicious for a mass lesion. 5. Esophageal dilatation likely due to dysmotility although distal stricture not excluded.   Medical Consultants:  PCCM  Other Consultants:  None  IAnti-Infectives:   Zithromax and Rocephin 3/22 -->  Reyne Dumas, MD  [facility] Pager [phone]  If 7PM-7AM, please contact night-coverage [email or web address] Password [facility] 01/25/2016, 12:01 PM   LOS: 7 days    HPI/Subjective: Patient seen prior to surgery, stable  Objective: Filed Vitals:   01/24/16 2003 01/24/16 2018 01/25/16 0415 01/25/16 0808  BP: 134/81  136/84 134/86  Pulse: 90     Temp: 98.4 F (36.9 C)  97.8 F (36.6 C) 98.3 F (36.8 C)  TempSrc: Oral  Oral Oral  Resp: '20  20 20  '$ Height:      Weight: 70.8 kg (156 lb 1.4 oz)     SpO2: 92% 94% 93% 93%    Intake/Output Summary (Last 24 hours) at 01/25/16 1201 Last data filed at 01/25/16 0049  Gross per 24 hour  Intake    720 ml  Output    675 ml  Net     45 ml    Exam:   General:  Pt is alert, follows commands appropriately, not in acute distress  Cardiovascular: Regular rate and rhythm, S1/S2, no murmurs, no rubs, no gallops  Respiratory: course breath sounds with rhonchi at the left base   Abdomen: Soft, non tender, non distended, bowel sounds present, no guarding  Extremities: pulses DP and PT palpable bilaterally  Neuro: Grossly nonfocal  Data Reviewed: Basic Metabolic Panel:  Recent Labs Lab 01/19/16 0701 01/20/16 0527 01/21/16 0449 01/23/16 0728 01/24/16 0510  NA 130* 130* 129* 135 135  K 4.6 4.0 4.5 4.3 5.0  CL 95* 96* 94* 101 98*  CO2 23 24 21* 25 27  GLUCOSE 146* 157* 151* 120* 102*  BUN '6 12 15 14 14  '$ CREATININE 0.75 0.63 0.61 0.71 0.70  CALCIUM 9.4 9.3 9.3 9.2 9.4   Liver Function Tests:  Recent Labs Lab 01/19/16 0701 01/23/16 0728 01/24/16 0510  AST '18 16 16  '$ ALT 12* 15* 14*  ALKPHOS 75 84 80  BILITOT 0.5 0.2* 0.4  PROT 6.5 5.7* 5.7*  ALBUMIN 3.5 3.3* 3.4*   CBC:  Recent Labs Lab 01/19/16 0701 01/20/16 0527 01/21/16 0449 01/23/16 0728 01/24/16 0510 01/25/16 0438  WBC 10.3 14.4* 16.6* 12.6* 13.4* 13.5*  NEUTROABS 9.4*  --  15.0* 8.7*  --   --   HGB 15.4 13.8 14.2 14.7 15.0 14.6  HCT 42.4 38.4* 39.9 40.4 43.5 42.4  MCV 79.3 79.0 80.1 80.0 81.8 80.9  PLT 226 224 272 263 262 251   CBG:  Recent Labs Lab 01/24/16 2002 01/24/16 2329 01/25/16 0414 01/25/16 0725  01/25/16 1103  GLUCAP 211* 127* 134* 96 144*    Recent Results (from the past 240 hour(s))  Culture, sputum-assessment     Status: None   Collection Time: 01/19/16  7:45 AM  Result Value Ref Range Status   Specimen Description SPUTUM  Final   Special Requests Normal  Final   Sputum evaluation   Final    MICROSCOPIC FINDINGS SUGGEST THAT THIS SPECIMEN IS NOT REPRESENTATIVE OF LOWER RESPIRATORY SECRETIONS. PLEASE RECOLLECT. Gram Stain Report Called to,Read Back By and Verified With: T HUBBARD,RN AT 0852 01/19/16 BY L BENFIELD    Report Status 01/19/2016 FINAL  Final     Scheduled Meds: . [MAR Hold] amLODipine  10 mg Oral Daily  . [MAR Hold] antiseptic oral rinse  7 mL Mouth Rinse BID  . [MAR Hold] cloZAPine  100 mg Oral QHS  . [MAR Hold] divalproex  500 mg Oral Daily   And  . [  MAR Hold] divalproex  1,000 mg Oral QHS  . [MAR Hold] enoxaparin (LOVENOX) injection  40 mg Subcutaneous Q24H  . [MAR Hold] feeding supplement (GLUCERNA SHAKE)  237 mL Oral BID BM  . [MAR Hold] gemfibrozil  600 mg Oral BID AC  . [MAR Hold] insulin aspart  0-9 Units Subcutaneous 6 times per day  . [MAR Hold] ipratropium-albuterol  3 mL Nebulization TID  . [MAR Hold] levofloxacin  500 mg Oral Daily  . [MAR Hold] nicotine  21 mg Transdermal Daily  . [MAR Hold] pantoprazole  40 mg Oral Daily  . [MAR Hold] predniSONE  60 mg Oral QAC breakfast   Continuous Infusions: . sodium chloride 10 mL/hr at 01/20/16 0710

## 2016-01-25 NOTE — Transfer of Care (Signed)
Immediate Anesthesia Transfer of Care Note  Patient: Chad Wang  Procedure(s) Performed: Procedure(s): MICROLARYNGOSCOPY WITH CO2 LASER, DEBULKING OF LARYNX MASS  (N/A)  Patient Location: PACU  Anesthesia Type:General  Level of Consciousness: awake, alert  and oriented  Airway & Oxygen Therapy: Patient Spontanous Breathing and Patient connected to nasal cannula oxygen  Post-op Assessment: Report given to RN, Post -op Vital signs reviewed and stable and Patient moving all extremities X 4  Post vital signs: Reviewed and stable  Last Vitals:  Filed Vitals:   01/25/16 0415 01/25/16 0808  BP: 136/84 134/86  Pulse:    Temp: 36.6 C 36.8 C  Resp: 20 20   HR 86, BP 130/78, RR 16, Sats 93% on 4L [place]   Complications: No apparent anesthesia complications

## 2016-01-25 NOTE — Anesthesia Postprocedure Evaluation (Signed)
Anesthesia Post Note  Patient: Chad Wang  Procedure(s) Performed: Procedure(s) (LRB): MICROLARYNGOSCOPY WITH CO2 LASER, DEBULKING OF LARYNX MASS  (N/A)  Patient location during evaluation: PACU Anesthesia Type: General Level of consciousness: awake and alert Pain management: pain level controlled Vital Signs Assessment: post-procedure vital signs reviewed and stable Respiratory status: spontaneous breathing, nonlabored ventilation, respiratory function stable and patient connected to nasal cannula oxygen Cardiovascular status: blood pressure returned to baseline and stable Postop Assessment: no signs of nausea or vomiting Anesthetic complications: no    Last Vitals:  Filed Vitals:   01/25/16 1300 01/25/16 1315  BP: 128/91 126/90  Pulse: 89 90  Temp:    Resp: 14 15    Last Pain:  Filed Vitals:   01/25/16 1327  PainSc: Asleep                 Carmello Cabiness S

## 2016-01-25 NOTE — Anesthesia Preprocedure Evaluation (Addendum)
Anesthesia Evaluation  Patient identified by MRN, date of birth, ID band Patient awake    Reviewed: Allergy & Precautions, NPO status , Patient's Chart, lab work & pertinent test results  Airway Mallampati: II  TM Distance: >3 FB Neck ROM: Full    Dental no notable dental hx. 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 The lesion encompasses 18 x 17 x 23 mm (AP by transverse by CC). See series 21, image 63 and sagittal image 66. The right aryepiglottic fold appears invaded on series 201, image 65. The false cords appear relatively spared. The true cords are unaffected. The tumor does not involve the laryngeal cartilages but does track into the pre epiglottic fat.    Pulmonary exam normal breath sounds clear to auscultation       Cardiovascular hypertension, Pt. on medications Normal cardiovascular exam Rhythm:Regular Rate:Normal     Neuro/Psych Schizophrenia negative neurological ROS     GI/Hepatic negative GI ROS, Neg liver ROS,   Endo/Other  diabetes  Renal/GU negative Renal ROS  negative genitourinary   Musculoskeletal negative musculoskeletal ROS (+)   Abdominal   Peds negative pediatric ROS (+)  Hematology negative hematology ROS (+)   Anesthesia Other Findings   Reproductive/Obstetrics negative OB ROS                          Anesthesia Physical Anesthesia Plan  ASA: III  Anesthesia Plan: General   Post-op Pain Management:    Induction: Intravenous  Airway Management Planned: Oral ETT and Video Laryngoscope Planned  Additional Equipment:   Intra-op Plan:   Post-operative Plan: Possible Post-op intubation/ventilation  Informed Consent: I have reviewed the patients History and Physical,  chart, labs and discussed the procedure including the risks, benefits and alternatives for the proposed anesthesia with the patient or authorized representative who has indicated his/her understanding and acceptance.   Dental advisory given  Plan Discussed with: CRNA and Surgeon  Anesthesia Plan Comments: (Surgeon in room for induction for possible trach)       Anesthesia Quick Evaluation

## 2016-01-25 NOTE — Progress Notes (Signed)
01/25/2016 6:50 PM  Chad Wang [id number]  Post-Op Check    Temp:  [97.3 F (36.3 C)-98.4 F (36.9 C)] 97.3 F (36.3 C) (03/29 1633) Pulse Rate:  [87-113] 97 (03/29 1633) Resp:  [14-24] 18 (03/29 1633) BP: (121-150)/(78-91) 121/81 mmHg (03/29 1633) SpO2:  [92 %-98 %] 95 % (03/29 1633) Weight:  [70.8 kg (156 lb 1.4 oz)] 70.8 kg (156 lb 1.4 oz) (03/28 2003),     Intake/Output Summary (Last 24 hours) at 01/25/16 1850 Last data filed at 01/25/16 1500  Gross per 24 hour  Intake    840 ml  Output    980 ml  Net   -140 ml    Results for orders placed or performed during the hospital encounter of 01/18/16 (from the past 24 hour(s))  Glucose, capillary     Status: Abnormal   Collection Time: 01/24/16  8:02 PM  Result Value Ref Range   Glucose-Capillary 211 (H) 65 - 99 mg/dL  Glucose, capillary     Status: Abnormal   Collection Time: 01/24/16 11:29 PM  Result Value Ref Range   Glucose-Capillary 127 (H) 65 - 99 mg/dL  Glucose, capillary     Status: Abnormal   Collection Time: 01/25/16  4:14 AM  Result Value Ref Range   Glucose-Capillary 134 (H) 65 - 99 mg/dL  CBC     Status: Abnormal   Collection Time: 01/25/16  4:38 AM  Result Value Ref Range   WBC 13.5 (H) 4.0 - 10.5 K/uL   RBC 5.24 4.22 - 5.81 MIL/uL   Hemoglobin 14.6 13.0 - 17.0 g/dL   HCT 42.4 39.0 - 52.0 %   MCV 80.9 78.0 - 100.0 fL   MCH 27.9 26.0 - 34.0 pg   MCHC 34.4 30.0 - 36.0 g/dL   RDW 14.1 11.5 - 15.5 %   Platelets 251 150 - 400 K/uL  Glucose, capillary     Status: None   Collection Time: 01/25/16  7:25 AM  Result Value Ref Range   Glucose-Capillary 96 65 - 99 mg/dL   Comment 1 Notify RN    Comment 2 Document in Chart   Glucose, capillary     Status: Abnormal   Collection Time: 01/25/16 11:03 AM  Result Value Ref Range   Glucose-Capillary 144 (H) 65 - 99 mg/dL  Glucose, capillary     Status: Abnormal   Collection Time: 01/25/16 12:50 PM  Result Value Ref Range   Glucose-Capillary 169 (H) 65 -  99 mg/dL   Comment 1 Notify RN    Comment 2 Document in Chart   Glucose, capillary     Status: Abnormal   Collection Time: 01/25/16  4:34 PM  Result Value Ref Range   Glucose-Capillary 221 (H) 65 - 99 mg/dL   Comment 1 Notify RN    Comment 2 Document in Chart     SUBJECTIVE:  Min pain. Breathing better. No hemoptysis.  spont void  OBJECTIVE:  Voice sl congested.  Breathing easily with no cough or stridor.  IMPRESSION:   Satisfactory check  PLAN:  Advance diet and activity. Plans per Dr. Ellwood Sayers, Ileene Hutchinson

## 2016-01-26 ENCOUNTER — Encounter (HOSPITAL_COMMUNITY): Payer: Self-pay | Admitting: Otolaryngology

## 2016-01-26 LAB — CBC
HCT: 44.1 % (ref 39.0–52.0)
HEMOGLOBIN: 15.3 g/dL (ref 13.0–17.0)
MCH: 28.3 pg (ref 26.0–34.0)
MCHC: 34.7 g/dL (ref 30.0–36.0)
MCV: 81.5 fL (ref 78.0–100.0)
Platelets: 232 10*3/uL (ref 150–400)
RBC: 5.41 MIL/uL (ref 4.22–5.81)
RDW: 14 % (ref 11.5–15.5)
WBC: 13.9 10*3/uL — ABNORMAL HIGH (ref 4.0–10.5)

## 2016-01-26 LAB — GLUCOSE, CAPILLARY
GLUCOSE-CAPILLARY: 90 mg/dL (ref 65–99)
GLUCOSE-CAPILLARY: 102 mg/dL — AB (ref 65–99)
GLUCOSE-CAPILLARY: 282 mg/dL — AB (ref 65–99)
GLUCOSE-CAPILLARY: 207 mg/dL — AB (ref 65–99)
Glucose-Capillary: 256 mg/dL — ABNORMAL HIGH (ref 65–99)

## 2016-01-26 LAB — COMPREHENSIVE METABOLIC PANEL
ALK PHOS: 74 U/L (ref 38–126)
ALT: 14 U/L — AB (ref 17–63)
ANION GAP: 11 (ref 5–15)
AST: 13 U/L — ABNORMAL LOW (ref 15–41)
Albumin: 3.2 g/dL — ABNORMAL LOW (ref 3.5–5.0)
BUN: 16 mg/dL (ref 6–20)
CALCIUM: 9.4 mg/dL (ref 8.9–10.3)
CO2: 28 mmol/L (ref 22–32)
CREATININE: 0.74 mg/dL (ref 0.61–1.24)
Chloride: 99 mmol/L — ABNORMAL LOW (ref 101–111)
Glucose, Bld: 104 mg/dL — ABNORMAL HIGH (ref 65–99)
Potassium: 4.7 mmol/L (ref 3.5–5.1)
SODIUM: 138 mmol/L (ref 135–145)
TOTAL PROTEIN: 5.6 g/dL — AB (ref 6.5–8.1)
Total Bilirubin: 0.5 mg/dL (ref 0.3–1.2)

## 2016-01-26 MED ORDER — NICOTINE 14 MG/24HR TD PT24
14.0000 mg | MEDICATED_PATCH | Freq: Every day | TRANSDERMAL | Status: DC
  Administered 2016-01-27 – 2016-01-30 (×4): 14 mg via TRANSDERMAL
  Filled 2016-01-26 (×4): qty 1

## 2016-01-26 MED ORDER — HYDROCOD POLST-CPM POLST ER 10-8 MG/5ML PO SUER
5.0000 mL | Freq: Two times a day (BID) | ORAL | Status: DC
  Administered 2016-01-26 – 2016-01-30 (×8): 5 mL via ORAL
  Filled 2016-01-26 (×8): qty 5

## 2016-01-26 MED ORDER — MOMETASONE FURO-FORMOTEROL FUM 200-5 MCG/ACT IN AERO
2.0000 | INHALATION_SPRAY | Freq: Two times a day (BID) | RESPIRATORY_TRACT | Status: DC
  Administered 2016-01-26 – 2016-01-30 (×6): 2 via RESPIRATORY_TRACT
  Filled 2016-01-26 (×3): qty 8.8

## 2016-01-26 MED ORDER — NICOTINE 14 MG/24HR TD PT24: 14.0000 mg | MEDICATED_PATCH | Freq: Every day | TRANSDERMAL | Status: DC

## 2016-01-26 MED ORDER — IPRATROPIUM-ALBUTEROL 0.5-2.5 (3) MG/3ML IN SOLN: 3.0000 mL | Freq: Four times a day (QID) | RESPIRATORY_TRACT | Status: DC | PRN

## 2016-01-26 NOTE — Progress Notes (Signed)
1 Day Post-Op  Subjective: Did well after surgery, feels well.  Breathing feels a bit better, voice improved.  Swallowing well.  Objective: Vital signs in last 24 hours: Temp:  [97.3 F (36.3 C)-98.2 F (36.8 C)] 98.2 F (36.8 C) (03/30 0755) Pulse Rate:  [68-113] 90 (03/30 0755) Resp:  [14-24] 18 (03/30 0755) BP: (121-150)/(78-97) 142/97 mmHg (03/30 0755) SpO2:  [94 %-98 %] 94 % (03/30 0755) Last BM Date: 01/25/16  Intake/Output from previous day: 03/29 0701 - 03/30 0700 In: 600 [I.V.:600] Out: 705 [Urine:700; Blood:5] Intake/Output this shift: Total I/O In: 1448.3 [I.V.:1448.3] Out: -   General appearance: alert, cooperative, no distress and no stridor, somewhat muffled voice  Lab Results:   Recent Labs  01/25/16 0438 01/26/16 0550  WBC 13.5* 13.9*  HGB 14.6 15.3  HCT 42.4 44.1  PLT 251 232   BMET  Recent Labs  01/24/16 0510 01/26/16 0550  NA 135 138  K 5.0 4.7  CL 98* 99*  CO2 27 28  GLUCOSE 102* 104*  BUN 14 16  CREATININE 0.70 0.74  CALCIUM 9.4 9.4   PT/INR No results for input(s): LABPROT, INR in the last 72 hours. ABG No results for input(s): PHART, HCO3 in the last 72 hours.  Invalid input(s): PCO2, PO2  Studies/Results: No results found.  Anti-infectives: Anti-infectives    Start     Dose/Rate Route Frequency Ordered Stop   01/23/16 1000  levofloxacin (LEVAQUIN) tablet 500 mg     500 mg Oral Daily 01/22/16 1101     01/20/16 2200  azithromycin (ZITHROMAX) tablet 500 mg  Status:  Discontinued     500 mg Oral Daily at bedtime 01/20/16 1201 01/22/16 1101   01/19/16 2200  cefTRIAXone (ROCEPHIN) 1 g in dextrose 5 % 50 mL IVPB  Status:  Discontinued     1 g 100 mL/hr over 30 Minutes Intravenous Every 24 hours 01/19/16 0045 01/22/16 1101   01/19/16 2200  azithromycin (ZITHROMAX) 500 mg in dextrose 5 % 250 mL IVPB  Status:  Discontinued     500 mg 250 mL/hr over 60 Minutes Intravenous Every 24 hours 01/19/16 0045 01/20/16 1201   01/18/16  2145  azithromycin (ZITHROMAX) tablet 500 mg     500 mg Oral  Once 01/18/16 2144 01/18/16 2211   01/18/16 2145  cefTRIAXone (ROCEPHIN) 1 g in dextrose 5 % 50 mL IVPB     1 g 100 mL/hr over 30 Minutes Intravenous  Once 01/18/16 2144 01/18/16 2240      Assessment/Plan: Supraglottic mass s/p Procedure(s): MICROLARYNGOSCOPY WITH CO2 LASER, DEBULKING OF LARYNX MASS  (N/A) Did quite well after surgery.  Airway more open now.  Pathology pending.  Stable for discharge from airway standpoint.  Further pulmonary care per primary team.  Depending on pathology result, will likely then need outpatient PET scan and biopsy of chest mass.  Follow-up with me after discharge.  LOS: 8 days    Giordan Fordham 01/26/2016

## 2016-01-26 NOTE — Care Management Important Message (Signed)
Important Message  Patient Details  Name: Chad Wang MRN: [id number] Date of Birth: 1956-04-15   Medicare Important Message Given:  Yes    [name] Roscoe 01/26/2016, 11:43 AM

## 2016-01-26 NOTE — Op Note (Signed)
NAMEJASHON, Chad Wang             ACCOUNT NO.:  [id number]  MEDICAL RECORD NO.:  [id number]  LOCATION:  2N56O                        FACILITY:  [place]  PHYSICIAN:  Onnie Graham, MD     DATE OF BIRTH:  07-25-1956  DATE OF PROCEDURE:  01/25/2016 DATE OF DISCHARGE:                              OPERATIVE REPORT   PREOPERATIVE DIAGNOSIS:  Supraglottic laryngeal mass.  POSTOPERATIVE DIAGNOSIS:  Supraglottic laryngeal mass.  PROCEDURE:  Suspended microdirect laryngoscopy with CO2 laser, debulking of laryngeal mass, and biopsy.  SURGEON:  Onnie Graham, M.D.  ANESTHESIA:  General endotracheal anesthesia.  COMPLICATIONS:  None.  INDICATION:  The patient is a 60 year old male who presented to the hospital late last week with difficulty breathing, and on bronchoscopy, he was found to have an obstructing laryngeal mass.  He presents to the operating room today for surgical biopsy as well as debulking of the mass for airway management.  FINDINGS:  There was a bulky mass on the laryngeal surface of the epiglottis in the midline or possibly the pedicle a little more to the right side that extends to the anterior commissure, but appears to be free of the anterior commissure.  The anterior commissure of the glottis had a whitish exudative coating and a separate biopsy was taken from this location.  The subglottis was free of any abnormality.  There was whitish, thickened mucosa of the posterior supraglottic endolarynx as well on both sides.  DESCRIPTION OF PROCEDURE:  The patient was identified in the holding room and informed consent having been obtained including discussion of risks, benefits, alternatives, the patient was brought to the operative suite and put on the operating table in supine position.  Anesthesia was induced and the patient was turned under mask anesthesia.  The eyes were taped closed and the patient was easily mask ventilated.  A tooth guard was placed over the  upper teeth and the Miller laryngoscope was then used to expose the larynx.  A #6 laser safe tube was then placed into the larynx without difficulty.  Positioning was confirmed by Anesthesia after the cuff was inflated.  The tube was taped to the left side of the face.  At this point, the Storz laryngoscope was then placed into the supraglottic position and suspended to the Mayo stand using a Lewy arm. Damp pads were taped over the eyes and damp towel was placed over the face.  Epinephrine-soaked pledgets were placed against the supraglottic mass for a couple of minutes and then removed.  A preoperative photograph was made with the 0 degree and 30-degree telescopes.  A CO2 laser was then used on a setting of 8 watts continuous to make an incision into the mass in order to debulk the mass from the airway.  A large portion of the mass was able to be removed in this fashion leaving the depth of the mass, but removing the obstructive portion from the airway.  This tissue was placed and passed to nursing for pathology. There was no significant bleeding with this portion of the case.  The glottis was then more easily examined with the telescopes.  An anterior commissure biopsy was taken with upbiting cup forceps from  the left side and sent separately for pathology.  The larynx was suctioned and photographs were made after the debulking.  The laryngoscope was then taken out of suspension and removed from the patient's mouth while suctioning the airway.  He was then turned back to Anesthesia for wake up after removing the tooth guard.  He was extubated and moved to the recovery room in stable condition.  Of note, the teeth were in very bad shape with severe dental caries and erosion of the teeth.  Several were loose before the case began.  All the teeth remained in their position after the case was over.     Onnie Graham, MD     DDB/MEDQ  D:  01/25/2016  T:  01/26/2016  Job:   [id number]  cc:   Dr. Redmond Baseman' office

## 2016-01-26 NOTE — Progress Notes (Signed)
Nutrition Follow-up  DOCUMENTATION CODES:   Non-severe (moderate) malnutrition in context of chronic illness  INTERVENTION:  Continue Glucerna Shake po BID, each supplement provides 220 kcal and 10 grams of protein.  Encourage adequate PO intake.   NUTRITION DIAGNOSIS:   Inadequate oral intake related to poor appetite as evidenced by per patient/family report; improving  GOAL:   Patient will meet greater than or equal to 90% of their needs; progressing  MONITOR:   PO intake, Supplement acceptance, Weight trends, Labs, I & O's  REASON FOR ASSESSMENT:   Malnutrition Screening Tool    ASSESSMENT:   60 year old gentleman with history of schizophrenia and tobacco abuse with persistent cough and shortness of breath was found to have new lung masses one endobronchial and one at left epices as well as diffuse disease of right lung worrisome for atypical pneumonia  Procedure (3/29): MICROLARYNGOSCOPY WITH CO2 LASER, DEBULKING OF LARYNX MASS  Meal completion this AM was 100%. Pt reports appetite is good. Pt reports no difficulties with chewing and swallowing. Pt currently has Glucerna ordered and has been consuming them. Pt encouraged to eat his food at meals and to consume his supplements.   Labs and medications reviewed.   Diet Order:  DIET SOFT Room service appropriate?: Yes; Fluid consistency:: Thin  Skin:  Reviewed, no issues  Last BM:  3/29  Height:   Ht Readings from Last 1 Encounters:  01/20/16 '5\' 10"'$  (1.778 m)    Weight:   Wt Readings from Last 1 Encounters:  01/24/16 156 lb 1.4 oz (70.8 kg)    Ideal Body Weight:  76.8 kg  BMI:  Body mass index is 22.4 kg/(m^2).  Estimated Nutritional Needs:   Kcal:  1900-2100  Protein:  90-100 grams  Fluid:  1.9 - 2.1L/day  EDUCATION NEEDS:   No education needs identified at this time  Corrin Parker, MS, RD, LDN Pager # [phone] After hours/ weekend pager # [phone]

## 2016-01-26 NOTE — Progress Notes (Signed)
[facility] Progress Note   Chad Wang  [name]:[id number]  DOB: 04-Mar-1956  DOA: 01/18/2016 PCP: No PCP Per Patient  Brief narrative: Chad Wang is a 60 y.o. male with schizophernia, DM 2, smoker up until 1 wk ago who presents for cough and dyspnea. Has had several months duration of progressively worsening cough, mixed with non productive and productive cough of yellowish sputum, competed several rounds of ABX but seems like symptoms initially get better and soon after d/c ABX, symptoms worse. This has been associated with hoarseness, some weight loss, poor oral intaje and malaise. He reports he quit smoking 1 weeks ago and has been using nicotine patch since.  CTA of the chest was negative for PE - revealed collapse of LLL with abrupt termination of LLL bronchus suspicious for endobronchial lesion. He also had masslike consolidation of the LUL with adjacent pleural effusion and tenting, highly suspicious for mass lesion. Bronch performed by pulmonary team on 3/24 showed a glottic mass and a left mainstem mass. The procedure was aborted. ENT was consulted. He underwent a microlaryngoscopy with debulking of laryngeal mass on 3/29.  Subjective: Has cough with white sputum. Cough quite severe. Has tightness in chest with ambulation.   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 - Rocephin / Zitrho started on 3/22- transitioned to Yonah 3/25- will stop after tomorrow's dose which will complete 10 days  Acute resp failure- - due to above - on 2 L O2- will have ambulatory pulse ox checked to decide on home O2 - likely has underlying emphysema as well-- will need f/u with pulmonary- add Dulera today - weaning prednisone -  Tussionex Q12  Sinus tachycardia - due to nebs? - follow  Murmur - 2/6 at apex- obtain ECHO  Mod  Malnutrition - cont supplements per dietary    Hyponatremia - resolved - likely dehydration for poor PO intake- U sodium was 17 on 3/23-     DM type 2 (diabetes mellitus, type 2) (HCC) - Metformin on hold- cont SSI    Tobacco abuse -quit about 1 wk ago    Essential hypertension - Norvasc  Schizophrenia - Clozapine, Depakote   Antibiotics: Anti-infectives    Start     Dose/Rate Route Frequency Ordered Stop   01/23/16 1000  levofloxacin (LEVAQUIN) tablet 500 mg     500 mg Oral Daily 01/22/16 1101     01/20/16 2200  azithromycin (ZITHROMAX) tablet 500 mg  Status:  Discontinued     500 mg Oral Daily at bedtime 01/20/16 1201 01/22/16 1101   01/19/16 2200  cefTRIAXone (ROCEPHIN) 1 g in dextrose 5 % 50 mL IVPB  Status:  Discontinued     1 g 100 mL/hr over 30 Minutes Intravenous Every 24 hours 01/19/16 0045 01/22/16 1101   01/19/16 2200  azithromycin (ZITHROMAX) 500 mg in dextrose 5 % 250 mL IVPB  Status:  Discontinued     500 mg 250 mL/hr over 60 Minutes Intravenous Every 24 hours 01/19/16 0045 01/20/16 1201   01/18/16 2145  azithromycin (ZITHROMAX) tablet 500 mg     500 mg Oral  Once 01/18/16 2144 01/18/16 2211   01/18/16 2145  cefTRIAXone (ROCEPHIN) 1 g in dextrose 5 % 50 mL IVPB     1 g 100 mL/hr over 30 Minutes Intravenous  Once 01/18/16 2144 01/18/16 2240  Code Status:     Code Status Orders        Start     Ordered   01/19/16 0046  Full code   Continuous     01/19/16 0045    Code Status History    Date Active Date Inactive Code Status Order ID Comments User Context   08/13/2014 11:28 PM 08/17/2014 12:48 PM Full Code [id number]  Hoy Morn, MD ED   12/13/2012  4:44 PM 12/15/2012  1:28 PM Full Code [id number]  Jasper Riling. Alvino Chapel, MD ED     Family Communication:   Disposition Plan: home when stable DVT prophylaxis: Lovenox Consultants: ENT, pCCM Procedures:  Bronchoscopy 3/24 microlaryngoscopy with debulking of laryngeal mass 3/29.    Objective: Filed  Weights   01/21/16 2053 01/23/16 2015 01/24/16 2003  Weight: 73.6 kg (162 lb 4.1 oz) 72.1 kg (158 lb 15.2 oz) 70.8 kg (156 lb 1.4 oz)    Intake/Output Summary (Last 24 hours) at 01/26/16 1411 Last data filed at 01/26/16 1041  Gross per 24 hour  Intake 1688.33 ml  Output    700 ml  Net 988.33 ml     Vitals Filed Vitals:   01/26/16 0327 01/26/16 0737 01/26/16 0755 01/26/16 1207  BP: 128/78  142/97 129/72  Pulse: 68  90 93  Temp: 97.6 F (36.4 C)  98.2 F (36.8 C) 98.1 F (36.7 C)  TempSrc: Oral  Oral Oral  Resp: '16  18 16  '$ Height:      Weight:      SpO2: 94% 97% 94% 94%    Exam:  General:  Pt is alert, coughing persistently   HEENT: No icterus, No thrush, oral mucosa moist  Cardiovascular: regular rate and rhythm, S1/S2 No murmur- tachycardic in low 100s at rest  Respiratory: b/l rhonchi, decreased breath sound in left lung base  Abdomen: Soft, +Bowel sounds, non tender, non distended, no guarding  MSK: No cyanosis or clubbing- no pedal edema   Data Reviewed: Basic Metabolic Panel:  Recent Labs Lab 01/20/16 0527 01/21/16 0449 01/23/16 0728 01/24/16 0510 01/26/16 0550  NA 130* 129* 135 135 138  K 4.0 4.5 4.3 5.0 4.7  CL 96* 94* 101 98* 99*  CO2 24 21* '25 27 28  '$ GLUCOSE 157* 151* 120* 102* 104*  BUN '12 15 14 14 16  '$ CREATININE 0.63 0.61 0.71 0.70 0.74  CALCIUM 9.3 9.3 9.2 9.4 9.4   Liver Function Tests:  Recent Labs Lab 01/23/16 0728 01/24/16 0510 01/26/16 0550  AST 16 16 13*  ALT 15* 14* 14*  ALKPHOS 84 80 74  BILITOT 0.2* 0.4 0.5  PROT 5.7* 5.7* 5.6*  ALBUMIN 3.3* 3.4* 3.2*   No results for input(s): LIPASE, AMYLASE in the last 168 hours. No results for input(s): AMMONIA in the last 168 hours. CBC:  Recent Labs Lab 01/21/16 0449 01/23/16 0728 01/24/16 0510 01/25/16 0438 01/26/16 0550  WBC 16.6* 12.6* 13.4* 13.5* 13.9*  NEUTROABS 15.0* 8.7*  --   --   --   HGB 14.2 14.7 15.0 14.6 15.3  HCT 39.9 40.4 43.5 42.4 44.1  MCV 80.1 80.0  81.8 80.9 81.5  PLT 272 263 262 251 232   Cardiac Enzymes: No results for input(s): CKTOTAL, CKMB, CKMBINDEX, TROPONINI in the last 168 hours. BNP (last 3 results)  Recent Labs  01/18/16 1634  BNP 24.5    ProBNP (last 3 results) No results for input(s): PROBNP in the last 8760 hours.  CBG:  Recent Labs Lab  01/25/16 2014 01/25/16 2355 01/26/16 0329 01/26/16 0755 01/26/16 1204  GLUCAP 290* 212* 90 102* 282*    Recent Results (from the past 240 hour(s))  Culture, sputum-assessment     Status: None   Collection Time: 01/19/16  7:45 AM  Result Value Ref Range Status   Specimen Description SPUTUM  Final   Special Requests Normal  Final   Sputum evaluation   Final    MICROSCOPIC FINDINGS SUGGEST THAT THIS SPECIMEN IS NOT REPRESENTATIVE OF LOWER RESPIRATORY SECRETIONS. PLEASE RECOLLECT. Gram Stain Report Called to,Read Back By and Verified With: T HUBBARD,RN AT 6681 01/19/16 BY L BENFIELD    Report Status 01/19/2016 FINAL  Final     Studies: No results found.  Scheduled Meds:  Scheduled Meds: . amLODipine  10 mg Oral Daily  . antiseptic oral rinse  7 mL Mouth Rinse BID  . cloZAPine  100 mg Oral QHS  . divalproex  500 mg Oral Daily   And  . divalproex  1,000 mg Oral QHS  . enoxaparin (LOVENOX) injection  40 mg Subcutaneous Q24H  . feeding supplement (GLUCERNA SHAKE)  237 mL Oral BID BM  . gemfibrozil  600 mg Oral BID AC  . insulin aspart  0-9 Units Subcutaneous 6 times per day  . ipratropium-albuterol  3 mL Nebulization TID  . levofloxacin  500 mg Oral Daily  . nicotine  21 mg Transdermal Daily  . pantoprazole  40 mg Oral Daily   Continuous Infusions: . sodium chloride 10 mL/hr at 01/25/16 1610    Time spent on care of this patient: 58 min   [place], MD 01/26/2016, 2:11 PM  LOS: 8 days   [facility] Office  [phone] Pager - Text Page per [email or web address] If 7PM-7AM, please contact night-coverage [email or web address]

## 2016-01-27 ENCOUNTER — Inpatient Hospital Stay (HOSPITAL_COMMUNITY): Payer: Medicare Other

## 2016-01-27 DIAGNOSIS — R011 Cardiac murmur, unspecified: Secondary | ICD-10-CM

## 2016-01-27 LAB — GLUCOSE, CAPILLARY
GLUCOSE-CAPILLARY: 159 mg/dL — AB (ref 65–99)
Glucose-Capillary: 142 mg/dL — ABNORMAL HIGH (ref 65–99)
Glucose-Capillary: 151 mg/dL — ABNORMAL HIGH (ref 65–99)
Glucose-Capillary: 173 mg/dL — ABNORMAL HIGH (ref 65–99)
Glucose-Capillary: 187 mg/dL — ABNORMAL HIGH (ref 65–99)
Glucose-Capillary: 213 mg/dL — ABNORMAL HIGH (ref 65–99)
Glucose-Capillary: 249 mg/dL — ABNORMAL HIGH (ref 65–99)

## 2016-01-27 LAB — ECHOCARDIOGRAM COMPLETE
Height: 70 in
Weight: 2497.37 oz

## 2016-01-27 MED ORDER — INSULIN GLARGINE 100 UNIT/ML ~~LOC~~ SOLN
10.0000 [IU] | Freq: Every day | SUBCUTANEOUS | Status: DC
  Administered 2016-01-27 – 2016-01-30 (×4): 10 [IU] via SUBCUTANEOUS
  Filled 2016-01-27 (×4): qty 0.1

## 2016-01-27 MED ORDER — METHOCARBAMOL 1000 MG/10ML IJ SOLN
500.0000 mg | Freq: Three times a day (TID) | INTRAVENOUS | Status: DC | PRN
  Filled 2016-01-27: qty 5

## 2016-01-27 NOTE — Progress Notes (Signed)
01/27/2016 6:29 PM  Pt returned from MRI a few moments ago SOB.  Oxygen saturations low to mid 90s on 2L nasal cannula, bumped pt up to 3L nasal cannula for comfort.  Oxygen saturations currently at 96%.  Pt was reconnected to continuous pulse ox.  Respiratory therapy was also notified to administer prn breathing treatments.  Other vitals stable.  Dr. Yelena Metzer Sabal notified.  Will continue to monitor. Princella Pellegrini

## 2016-01-27 NOTE — Progress Notes (Signed)
Inpatient Diabetes Program Recommendations  AACE/ADA: New Consensus Statement on Inpatient Glycemic Control (2015)  Target Ranges:  Prepandial:   less than 140 mg/dL      Peak postprandial:   less than 180 mg/dL (1-2 hours)      Critically ill patients:  140 - 180 mg/dL   Results for Chad Wang, Chad Wang (MRN [id number]) as of 01/27/2016 09:58  Ref. Range 01/26/2016 07:55 01/26/2016 12:04 01/26/2016 16:47 01/26/2016 20:11 01/27/2016 00:43 01/27/2016 03:54  Glucose-Capillary Latest Ref Range: 65-99 mg/dL 102 (H) 282 (H) 256 (H) 207 (H) 213 (H) 187 (H)   Review of Glycemic Control  Diabetes history: DM 2 Outpatient Diabetes medications: Metformin 500 mg BID Current orders for Inpatient glycemic control: Novolog Sensitive Q4hrs  Inpatient Diabetes Program Recommendations: Correction (SSI): Glucose mostly in the 200's, please increase correction scale to Novolog Moderate TID.  Thanks,  Tama Headings RN, MSN, [facility] Inpatient Diabetes Coordinator Team Pager [phone] (8a-5p)

## 2016-01-27 NOTE — Progress Notes (Signed)
  Echocardiogram 2D Echocardiogram has been performed.  Chad Wang 01/27/2016, 8:56 AM

## 2016-01-27 NOTE — Progress Notes (Addendum)
Patient ID: Chad Wang, male   DOB: 08-Apr-1956, 60 y.o.   MRN: [id number] [facility] PROGRESS NOTE  Chad Wang PJA:[id number] DOB: 1956-02-03 DOA: 01/18/2016 PCP: No PCP Per Patient   Brief narrative:    60 y.o. male presented to [facility] ED 03/22 with several months duration of progressively worsening cough, mixed with non productive and productive cough of yellowish sputum, competed several rounds of ABX but seems like symptoms initially get better and soon after d/c ABX, symptoms worse. This has been associated with hoarseness, some weight loss, poor oral intaje and malaise. He reports he quit smoking 1 weeks ago and has been using nicotine patch since.   In ED, pt had CTA of the chest which was negative for PE but did reveal collapse of LLL with abrupt termination of LLL bronchus suspicious for endobronchial lesion. Pt also had masslike consolidation of the LUL with adjacent pleural effusion and suspicious for mass lesion. In addition. He had diffuse emphysematous changes. Pt was admitted by [facility] and PCCM was called for further evaluation / consideration of bronchoscopy.  Assessment/Plan:    Principal Problem:   Dyspnea due to Mass of lower lobe of left lung - Presumed LLL endobronchial lesion with additional mass lesion and probable malignant effusion, ? CAP Status post bronchoscopy of the left mainstem, found to have a large polypoid mass epiglottis, partially obstructing the airway. Biopsy results pending.  microdirect laryngoscopy with CO2 laser, debulking of laryngeal mass, and biopsy.  3/30  Continue empiric CAP coverage. Currently on Levaquin started 3/26 - Supplemental O2 as needed to maintain SpO2 > 92%. Stable on 3 L - tobacco cessation counseling provided   Back pain Obtain MRI of the thoracic and lumbar spine to rule out underlying malignancy Robaxin for back pain     Pulmonary emphysema (HCC) - provide BD's scheduled and as needed , continue  prednisone -Continue empiric CAP coverage. Currently on Levaquin started 3/26-for 10 days  Discontinued IV Solu-Medrol  now  off steroids     Hyponatremia/ siadh - improved with IVF but also possibly related to malignant process  - continue to monitor     DM type 2 (diabetes mellitus, type 2) (HCC) - hold metformin , Accu-Cheks stable  - placed on SSI, start lantus     Tobacco abuse - counseled on cessation     Essential hypertension - reasonable inpatient control   Paranoid schizophrenia Continue Clozaril 100 daily at bedtime, continue Depakote 500 a.m. 1000 p.m   Depakote levels Therapeutic   DVT prophylaxis - Lovenox SQ   Code Status: Full.  Family Communication:  plan of care discussed with the patient Disposition Plan: anticipate patient to discharge   whenever stable from ENT standpoint    IV access:  Peripheral IV  Procedures and diagnostic studies:    Dg Chest 2 View 01/09/2016 New focal masslike opacity in the posterior left upper lung, cannot exclude pulmonary neoplasm. 2. Questionable mild thickening of the right paratracheal stripe, adenopathy not excluded.  Ct Angio Chest Pe W/cm &/or Wo Cm 01/18/2016   No evidence of significant pulmonary embolus. 2. Diffuse emphysematous changes in the lungs. Diffuse alveolar infiltration in the right lung is probably due to pneumonia but asymmetric edema could also have this appearance. 3. Collapse of the left lower lung with abrupt termination of the left lower lobe bronchus, suspicious for endobronchial lesion. Consider bronchoscopy for further evaluation. 4. Masslike consolidation in the left upper lung with adjacent pleural effusion and tenting. This  is highly suspicious for a mass lesion. 5. Esophageal dilatation likely due to dysmotility although distal stricture not excluded.   Medical Consultants:  PCCM  Other Consultants:  None  IAnti-Infectives:    Anti-infectives    Start     Dose/Rate Route Frequency Ordered  Stop   01/23/16 1000  levofloxacin (LEVAQUIN) tablet 500 mg     500 mg Oral Daily 01/22/16 1101     01/20/16 2200  azithromycin (ZITHROMAX) tablet 500 mg  Status:  Discontinued     500 mg Oral Daily at bedtime 01/20/16 1201 01/22/16 1101   01/19/16 2200  cefTRIAXone (ROCEPHIN) 1 g in dextrose 5 % 50 mL IVPB  Status:  Discontinued     1 g 100 mL/hr over 30 Minutes Intravenous Every 24 hours 01/19/16 0045 01/22/16 1101   01/19/16 2200  azithromycin (ZITHROMAX) 500 mg in dextrose 5 % 250 mL IVPB  Status:  Discontinued     500 mg 250 mL/hr over 60 Minutes Intravenous Every 24 hours 01/19/16 0045 01/20/16 1201   01/18/16 2145  azithromycin (ZITHROMAX) tablet 500 mg     500 mg Oral  Once 01/18/16 2144 01/18/16 2211   01/18/16 2145  cefTRIAXone (ROCEPHIN) 1 g in dextrose 5 % 50 mL IVPB     1 g 100 mL/hr over 30 Minutes Intravenous  Once 01/18/16 2144 01/18/16 2240       Kennah Hehr, MD  [place] Pager [phone]  If 7PM-7AM, please contact night-coverage [email or web address] Password [email or web address] 01/27/2016, 10:44 AM   LOS: 9 days   HPI/Subjective: Continues to have a mild cough.  Objective: Filed Vitals:   01/26/16 2118 01/26/16 2215 01/27/16 0455 01/27/16 0900  BP: 146/74  129/67 116/54  Pulse: 81  74 91  Temp: 98.1 F (36.7 C)  98.6 F (37 C) 98.8 F (37.1 C)  TempSrc: Oral  Oral Oral  Resp: '19  17 20  '$ Height:      Weight:      SpO2: 96% 93% 94% 99%    Intake/Output Summary (Last 24 hours) at 01/27/16 1044 Last data filed at 01/27/16 0120  Gross per 24 hour  Intake    480 ml  Output    925 ml  Net   -445 ml    Exam:   General:  Pt is alert, follows commands appropriately, not in acute distress  Cardiovascular: Regular rate and rhythm, S1/S2, no murmurs, no rubs, no gallops  Respiratory: course breath sounds with rhonchi at the left base   Abdomen: Soft, non tender, non distended, bowel sounds present, no guarding  Extremities: pulses DP and PT palpable  bilaterally  Neuro: Grossly nonfocal  Data Reviewed: Basic Metabolic Panel:  Recent Labs Lab 01/21/16 0449 01/23/16 0728 01/24/16 0510 01/26/16 0550  NA 129* 135 135 138  K 4.5 4.3 5.0 4.7  CL 94* 101 98* 99*  CO2 21* '25 27 28  '$ GLUCOSE 151* 120* 102* 104*  BUN '15 14 14 16  '$ CREATININE 0.61 0.71 0.70 0.74  CALCIUM 9.3 9.2 9.4 9.4   Liver Function Tests:  Recent Labs Lab 01/23/16 0728 01/24/16 0510 01/26/16 0550  AST 16 16 13*  ALT 15* 14* 14*  ALKPHOS 84 80 74  BILITOT 0.2* 0.4 0.5  PROT 5.7* 5.7* 5.6*  ALBUMIN 3.3* 3.4* 3.2*   CBC:  Recent Labs Lab 01/21/16 0449 01/23/16 0728 01/24/16 0510 01/25/16 0438 01/26/16 0550  WBC 16.6* 12.6* 13.4* 13.5* 13.9*  NEUTROABS 15.0* 8.7*  --   --   --  HGB 14.2 14.7 15.0 14.6 15.3  HCT 39.9 40.4 43.5 42.4 44.1  MCV 80.1 80.0 81.8 80.9 81.5  PLT 272 263 262 251 232   CBG:  Recent Labs Lab 01/26/16 1204 01/26/16 1647 01/26/16 2011 01/27/16 0043 01/27/16 0354  GLUCAP 282* 256* 207* 213* 187*    Recent Results (from the past 240 hour(s))  Culture, sputum-assessment     Status: None   Collection Time: 01/19/16  7:45 AM  Result Value Ref Range Status   Specimen Description SPUTUM  Final   Special Requests Normal  Final   Sputum evaluation   Final    MICROSCOPIC FINDINGS SUGGEST THAT THIS SPECIMEN IS NOT REPRESENTATIVE OF LOWER RESPIRATORY SECRETIONS. PLEASE RECOLLECT. Gram Stain Report Called to,Read Back By and Verified With: T HUBBARD,RN AT 0852 01/19/16 BY L BENFIELD    Report Status 01/19/2016 FINAL  Final     Scheduled Meds: . amLODipine  10 mg Oral Daily  . antiseptic oral rinse  7 mL Mouth Rinse BID  . chlorpheniramine-HYDROcodone  5 mL Oral Q12H  . cloZAPine  100 mg Oral QHS  . divalproex  500 mg Oral Daily   And  . divalproex  1,000 mg Oral QHS  . enoxaparin (LOVENOX) injection  40 mg Subcutaneous Q24H  . feeding supplement (GLUCERNA SHAKE)  237 mL Oral BID BM  . gemfibrozil  600 mg Oral BID  AC  . insulin aspart  0-9 Units Subcutaneous 6 times per day  . levofloxacin  500 mg Oral Daily  . mometasone-formoterol  2 puff Inhalation BID  . nicotine  14 mg Transdermal Daily  . pantoprazole  40 mg Oral Daily   Continuous Infusions: . sodium chloride 10 mL/hr at 01/25/16 1610

## 2016-01-28 DIAGNOSIS — J387 Other diseases of larynx: Secondary | ICD-10-CM | POA: Insufficient documentation

## 2016-01-28 DIAGNOSIS — M5441 Lumbago with sciatica, right side: Secondary | ICD-10-CM

## 2016-01-28 DIAGNOSIS — M549 Dorsalgia, unspecified: Secondary | ICD-10-CM | POA: Insufficient documentation

## 2016-01-28 LAB — COMPREHENSIVE METABOLIC PANEL
ALBUMIN: 3.2 g/dL — AB (ref 3.5–5.0)
ALK PHOS: 68 U/L (ref 38–126)
ALT: 13 U/L — AB (ref 17–63)
AST: 12 U/L — AB (ref 15–41)
Anion gap: 10 (ref 5–15)
BILIRUBIN TOTAL: 0.6 mg/dL (ref 0.3–1.2)
BUN: 15 mg/dL (ref 6–20)
CO2: 27 mmol/L (ref 22–32)
CREATININE: 0.73 mg/dL (ref 0.61–1.24)
Calcium: 9 mg/dL (ref 8.9–10.3)
Chloride: 97 mmol/L — ABNORMAL LOW (ref 101–111)
GFR calc Af Amer: >60 mL/min (ref >60)
Glucose, Bld: 113 mg/dL — ABNORMAL HIGH (ref 65–99)
Potassium: 4.4 mmol/L (ref 3.5–5.1)
Sodium: 134 mmol/L — ABNORMAL LOW (ref 135–145)
TOTAL PROTEIN: 5.6 g/dL — AB (ref 6.5–8.1)

## 2016-01-28 LAB — GLUCOSE, CAPILLARY
GLUCOSE-CAPILLARY: 97 mg/dL (ref 65–99)
GLUCOSE-CAPILLARY: 271 mg/dL — AB (ref 65–99)
GLUCOSE-CAPILLARY: 151 mg/dL — AB (ref 65–99)
Glucose-Capillary: 157 mg/dL — ABNORMAL HIGH (ref 65–99)
Glucose-Capillary: 198 mg/dL — ABNORMAL HIGH (ref 65–99)

## 2016-01-28 MED ORDER — DOCUSATE SODIUM 100 MG PO CAPS
100.0000 mg | ORAL_CAPSULE | Freq: Two times a day (BID) | ORAL | Status: DC
  Administered 2016-01-28 – 2016-01-30 (×5): 100 mg via ORAL
  Filled 2016-01-28 (×5): qty 1

## 2016-01-28 MED ORDER — MOMETASONE FURO-FORMOTEROL FUM 200-5 MCG/ACT IN AERO: 2.0000 | INHALATION_SPRAY | Freq: Two times a day (BID) | RESPIRATORY_TRACT | Status: DC

## 2016-01-28 MED ORDER — ALBUTEROL SULFATE HFA 108 (90 BASE) MCG/ACT IN AERS: 2.0000 | INHALATION_SPRAY | Freq: Four times a day (QID) | RESPIRATORY_TRACT | Status: DC | PRN

## 2016-01-28 MED ORDER — DOCUSATE SODIUM 100 MG PO CAPS: 100.0000 mg | ORAL_CAPSULE | Freq: Two times a day (BID) | ORAL | Status: DC

## 2016-01-28 MED ORDER — INSULIN ASPART 100 UNIT/ML ~~LOC~~ SOLN
0.0000 [IU] | Freq: Three times a day (TID) | SUBCUTANEOUS | Status: DC
  Administered 2016-01-29: 2 [IU] via SUBCUTANEOUS
  Administered 2016-01-29 – 2016-01-30 (×3): 1 [IU] via SUBCUTANEOUS

## 2016-01-28 MED ORDER — INSULIN ASPART 100 UNIT/ML ~~LOC~~ SOLN: 0.0000 [IU] | Freq: Every day | SUBCUTANEOUS | Status: DC

## 2016-01-28 MED ORDER — HYDROCOD POLST-CPM POLST ER 10-8 MG/5ML PO SUER: 5.0000 mL | Freq: Two times a day (BID) | ORAL | Status: DC

## 2016-01-28 MED ORDER — GLUCERNA SHAKE PO LIQD: 237.0000 mL | Freq: Two times a day (BID) | ORAL | Status: DC

## 2016-01-28 MED ORDER — NICOTINE 14 MG/24HR TD PT24: 14.0000 mg | MEDICATED_PATCH | Freq: Every day | TRANSDERMAL | Status: DC

## 2016-01-28 NOTE — Evaluation (Signed)
2Physical Therapy Evaluation Patient Details Name: Chad Wang MRN: [id number] DOB: 1956/05/29 Today's Date: 01/28/2016   History of Present Illness  60 y.o. male with pulmonary emphysema presented to [facility] ED 03/22 with several months duration of progressively worsening cough, mixed with non productive and productive cough of yellowish sputum, competed several rounds of ABX but seems like symptoms initially get better and soon after d/c ABX, symptoms worse. This has been associated with hoarseness, some weight loss, poor oral intaje and malaise.  workup reveals mass in lower lobe of left lung.    Clinical Impression  Patient presents with decreased cardiopulmonary function limiting independence with gait and mobility.  Patient with decreased O2 sats during gait when ambulating without oxygen.  Feel patient would benefit from continued PT to address mobility and progress patient back to baseline of independent.  Due to limited caregiver support, feel patient will need short-term SNF for further rehab to ultimately return home.      Follow Up Recommendations SNF    Equipment Recommendations  None recommended by PT    Recommendations for Other Services       Precautions / Restrictions Precautions Precautions: Fall Restrictions Weight Bearing Restrictions: No      Mobility  Bed Mobility Overal bed mobility: Modified Independent             General bed mobility comments: used bed rail  Transfers Overall transfer level: Modified independent Equipment used: Rolling walker (2 wheeled)                Ambulation/Gait Ambulation/Gait assistance: Min assist Ambulation Distance (Feet): 60 Feet Assistive device: Rolling walker (2 wheeled) Gait Pattern/deviations: Step-through pattern;Drifts right/left Gait velocity: 1.08 ft/sec Gait velocity interpretation: <1.8 ft/sec, indicative of risk for recurrent falls General Gait Details: O2 decreased to 82% during gait.  Patient  required 2 rest breaks to ambulate 60 feet.  Min assist for safety due to decreased O2 sats.  Stairs            Wheelchair Mobility    Modified Rankin (Stroke Patients Only)       Balance Overall balance assessment: Needs assistance Sitting-balance support: No upper extremity supported Sitting balance-Leahy Scale: Good     Standing balance support: No upper extremity supported Standing balance-Leahy Scale: Fair                               Pertinent Vitals/Pain Pain Assessment: No/denies pain    Home Living Family/patient expects to be discharged to:: Private residence Living Arrangements: Alone Available Help at Discharge: Available PRN/intermittently;Family;Friend(s) Type of Home: Apartment Home Access: Level entry     Home Layout: Two level Home Equipment: None      Prior Function Level of Independence: Independent               Hand Dominance        Extremity/Trunk Assessment   Upper Extremity Assessment: Overall WFL for tasks assessed           Lower Extremity Assessment: Overall WFL for tasks assessed         Communication   Communication: No difficulties  Cognition Arousal/Alertness: Awake/alert Behavior During Therapy: WFL for tasks assessed/performed Overall Cognitive Status: Within Functional Limits for tasks assessed                      General Comments      Exercises  Assessment/Plan    PT Assessment Patient needs continued PT services  PT Diagnosis Generalized weakness   PT Problem List Decreased activity tolerance;Decreased balance;Decreased mobility;Cardiopulmonary status limiting activity  PT Treatment Interventions DME instruction;Gait training;Functional mobility training;Therapeutic activities;Therapeutic exercise;Balance training;Patient/family education   PT Goals (Current goals can be found in the Care Plan section) Acute Rehab PT Goals Patient Stated Goal: get stronger and go  home PT Goal Formulation: With patient Time For Goal Achievement: 02/04/16 Potential to Achieve Goals: Good    Frequency Min 3X/week   Barriers to discharge Decreased caregiver support      Co-evaluation               End of Session Equipment Utilized During Treatment: Gait belt Activity Tolerance: Patient tolerated treatment well;Patient limited by fatigue;Treatment limited secondary to medical complications (Comment) (decreased O2) Patient left: in bed;with call bell/phone within reach Nurse Communication: Mobility status (O2 levels)         Time: 1330-1350 PT Time Calculation (min) (ACUTE ONLY): 20 min   Charges:   PT Evaluation $PT Eval Moderate Complexity: 1 Procedure     PT G CodesShanna Cisco 01/28/2016, 2:09 PM  01/28/2016 [place], [place]

## 2016-01-28 NOTE — Care Management Note (Addendum)
Case Management Note  Patient Details  Name: Chad Wang MRN: [id number] Date of Birth: 11-18-1955  Subjective/Objective:        Patent presented to [facility] ED on 3/22 with SOB            Action/Plan:  CM met with patient at bedside to discuss recommendations for [facility] services patient was agreeable, he also stated his brother [employer] [phone]  would assist with care once discharge. DME was ordered,  CM contacted patient's brother as per patient to share discharge plan, and brother states,  that patient is unable to care for self at home, and he lives an hour away, He is requesting that a higher level of care SNF be discussed. Explained to brother that patient would have to make this decision. CM discussed with patient and he now states, that he may not be able to manage at home alone in his deconditioned state.  CM notified Dr. Dala Dock ,and Wells Guiles RN  SW consult was placed for placement.  Expected Discharge Date:     01/28/16             Expected Discharge Plan:  Skilled Nursing Facility  In-House Referral:  Clinical Social Work  Discharge planning Services  CM Consult  Post Acute Care Choice:    Choice offered to:   patient  DME Arranged:    DME Agency:     HH Arranged:    [place] Agency:     Status of Service:  In process, will continue to follow  Medicare Important Message Given:  Yes Date Medicare IM Given:    Medicare IM give by:    Date Additional Medicare IM Given:    Additional Medicare Important Message give by:     If discussed at [place] of Stay Meetings, dates discussed:    Additional CommentsLaurena Slimmer, RN 01/28/2016, 12:46 PM

## 2016-01-28 NOTE — Progress Notes (Signed)
Patient ambulated >50 feet on room air.  O2 sats 92-94%.  Patient complain of shortness of breath during ambulation.

## 2016-01-28 NOTE — Discharge Summary (Signed)
Physician Discharge Summary  Chad Wang MRN: [id number] DOB/AGE: 1956/08/08 60 y.o.  PCP: No PCP Per Patient   Admit date: 01/18/2016 Discharge date: 01/28/2016  Discharge Diagnoses:     Principal Problem:   Mass of lower lobe of left lung Active Problems:   Community acquired pneumonia   Hyponatremia   DM type 2 (diabetes mellitus, type 2) (HCC)   Tobacco abuse   Pulmonary emphysema (HCC)   Malnutrition of moderate degree   Essential hypertension   Back pain   Laryngeal mass    Follow-up recommendations Follow-up with PCP in 3-5 days , including all  additional recommended appointments as below Follow-up CBC, CMP in 3-5 days Patient to follow-up with Dr. Redmond Baseman, ENT for hospital follow-up, follow-up on the results of his biopsy, patient needs outpatient PET scan to be arranged for by Dr. Redmond Baseman Patient needs to follow-up with pulmonary Dr. Elsworth Soho for further evaluation of the lung mass    Medication List    STOP taking these medications        levofloxacin 500 MG tablet  Commonly known as:  LEVAQUIN      TAKE these medications        albuterol 108 (90 Base) MCG/ACT inhaler  Commonly known as:  PROVENTIL HFA;VENTOLIN HFA  Inhale 2 puffs into the lungs every 6 (six) hours as needed for wheezing or shortness of breath.     amLODipine 10 MG tablet  Commonly known as:  NORVASC  Take 10 mg by mouth daily.     chlorpheniramine-HYDROcodone 10-8 MG/5ML Suer  Commonly known as:  TUSSIONEX  Take 5 mLs by mouth every 12 (twelve) hours.     cloZAPine 100 MG tablet  Commonly known as:  CLOZARIL  Take 100 mg by mouth at bedtime.     divalproex 500 MG DR tablet  Commonly known as:  DEPAKOTE  Take 500-1,000 mg by mouth 2 (two) times daily. Takes 565m in am and 10054min pm     docusate sodium 100 MG capsule  Commonly known as:  COLACE  Take 1 capsule (100 mg total) by mouth 2 (two) times daily.     feeding supplement (GLUCERNA SHAKE) Liqd  Take 237 mLs by mouth 2  (two) times daily between meals.     gemfibrozil 600 MG tablet  Commonly known as:  LOPID  Take 600 mg by mouth 2 (two) times daily.     metFORMIN 1000 MG tablet  Commonly known as:  GLUCOPHAGE  Take 500 mg by mouth 2 (two) times daily with a meal.     mometasone-formoterol 200-5 MCG/ACT Aero  Commonly known as:  DULERA  Inhale 2 puffs into the lungs 2 (two) times daily.     neomycin-polymyxin-hydrocortisone otic solution  Commonly known as:  CORTISPORIN  Place 3 drops into the right ear 4 (four) times daily.     nicotine 14 mg/24hr patch  Commonly known as:  NICODERM CQ - dosed in mg/24 hours  Place 1 patch (14 mg total) onto the skin daily.     omeprazole 20 MG capsule  Commonly known as:  PRILOSEC  Take 20 mg by mouth daily.         Discharge Condition: Stable   Discharge Instructions Get Medicines reviewed and adjusted: Please take all your medications with you for your next visit with your Primary MD  Please request your Primary MD to go over all hospital tests and procedure/radiological results at the follow up, please ask your Primary  MD to get all Hospital records sent to his/her office.  If you experience worsening of your admission symptoms, develop shortness of breath, life threatening emergency, suicidal or homicidal thoughts you must seek medical attention immediately by calling 911 or calling your MD immediately if symptoms less severe.  You must read complete instructions/literature along with all the possible adverse reactions/side effects for all the Medicines you take and that have been prescribed to you. Take any new Medicines after you have completely understood and accpet all the possible adverse reactions/side effects.   Do not drive when taking Pain medications.   Do not take more than prescribed Pain, Sleep and Anxiety Medications  Special Instructions: If you have smoked or chewed Tobacco in the last 2 yrs please stop smoking, stop any regular  Alcohol and or any Recreational drug use.  Wear Seat belts while driving.  Please note  You were cared for by a hospitalist during your hospital stay. Once you are discharged, your primary care physician will handle any further medical issues. Please note that NO REFILLS for any discharge medications will be authorized once you are discharged, as it is imperative that you return to your primary care physician (or establish a relationship with a primary care physician if you do not have one) for your aftercare needs so that they can reassess your need for medications and monitor your lab values.    No Known Allergies    Disposition: 01-Home or Self Care   Consults: * ENT Pulmonary      Significant Diagnostic Studies:  Dg Chest 2 View  01/09/2016  CLINICAL DATA:  Gradual onset productive cough.  Current smoker. EXAM: CHEST  2 VIEW COMPARISON:  12/13/2012 chest radiograph. FINDINGS: Normal heart size. Mildly tortuous thoracic aorta. Questionable mild thickening of the right paratracheal stripe. No pneumothorax. No pleural effusion. There is a new focal masslike opacity in the posterior left upper lung. There is new hyperlucency throughout the left lung. No pulmonary edema. IMPRESSION: 1. New focal masslike opacity in the posterior left upper lung, cannot exclude pulmonary neoplasm. 2. Questionable mild thickening of the right paratracheal stripe, adenopathy not excluded. 3. Recommend further evaluation with chest CT with IV contrast. Electronically Signed   By: Ilona Sorrel M.D.   On: 01/09/2016 12:33   Ct Soft Tissue Neck W Contrast  01/20/2016  CLINICAL DATA:  60 year old male with hoarseness since February. Laryngeal mass. Subsequent encounter. EXAM: CT NECK WITH CONTRAST TECHNIQUE: Multidetector CT imaging of the neck was performed using the standard protocol following the bolus administration of intravenous contrast. CONTRAST:  70m OMNIPAQUE IOHEXOL 300 MG/ML  SOLN COMPARISON:   Head CT without contrast 12/13/2012. Chest CTA 01/18/2016. 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 The lesion encompasses 18 x 17 x 23 mm (AP by transverse by CC). See series 21, image 63 and sagittal image 66. The right aryepiglottic fold appears invaded on series 201, image 65. The false cords appear relatively spared. The true cords are unaffected. The tumor does not involve the laryngeal cartilages but does track into the pre epiglottic fat. Pharyngeal soft tissue contours are within normal limits. Negative parapharyngeal and retropharyngeal spaces. Salivary glands: Negative sublingual space, submandibular glands and parotid glands. Thyroid: Negative. Lymph nodes: Right side level 2 and level 3 lymph nodes are small, measuring 5-6 mm short axis, but are larger than those on the left. Compare sagittal  images 44 and 45 on the right to images 87 and 88 on the left. There is also a conspicuous right level 4 lymph node measuring 7 mm short axis on series 21, image 89. No no larger nodes. Normal level 1 nodes and level 5 nodes. No cystic or necrotic nodes. Vascular: Major vascular structures in the neck and at the skullbase are patent. Calcified atherosclerosis at the skull base. Limited intracranial: Negative. Visualized orbits: Negative. Mastoids and visualized paranasal sinuses: Clear. Skeleton: Scattered poor dentition. Lower cervical spine osseous disc and endplate degeneration. No acute osseous abnormality identified. Upper chest: Stable from recent chest CTA (please see that report). IMPRESSION: 1. 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 Invasion of the right AE fold suspected. No laryngeal cartilage or paralaryngeal soft tissue involvement. 2. Small but asymmetrically conspicuous right level 2 through  level 4 lymph nodes, up to 7 mm short axis. Early nodal metastatic disease not excluded. 3. Abnormal upper chest as reported on 01/18/2016 (please see that report). 4. Scattered poor dentition. Electronically Signed   By: Genevie Ann M.D.   On: 01/20/2016 16:38   Ct Angio Chest Pe W/cm &/or Wo Cm  01/18/2016  CLINICAL DATA:  Cough and shortness of breath for many months. Nodular opacity seen in left upper lung on chest radiograph. EXAM: CT ANGIOGRAPHY CHEST WITH CONTRAST TECHNIQUE: Multidetector CT imaging of the chest was performed using the standard protocol during bolus administration of intravenous contrast. Multiplanar CT image reconstructions and MIPs were obtained to evaluate the vascular anatomy. CONTRAST:  85m OMNIPAQUE IOHEXOL 350 MG/ML SOLN COMPARISON:  Chest 01/09/2016 FINDINGS: Technically adequate study with good opacification of the central and segmental pulmonary arteries. No focal filling defects are demonstrated. No evidence of significant pulmonary embolus. Normal heart size. Mildly dilated esophagus with small air-fluid level. This may be due to dysmotility disorder. Prominent mediastinal lymph nodes with mild enlargement of a right paratracheal lymph node, measuring 16 mm short axis dimension. This is nonspecific in could be reactive or metastatic. There is diffuse emphysematous change throughout the lungs but more prominent in the left lung. Alveolar infiltrates in the right lung likely represent pneumonia but could indicate asymmetric edema. There is an abrupt termination to the left lower lobe bronchus with complete collapse of the left lower lung. This suggest an endobronchial obstructing lesion. Bronchoscopy may be useful for further evaluation. Masslike consolidation in the left upper lung with associated left pleural effusion and tenting of the pleura. This measures about 3.8 cm maximal diameter. Appearance is suspicious for a mass lesion although a focal consolidation could potentially  have this appearance. No pneumothorax. Included portions of the upper abdominal organs are grossly unremarkable. Degenerative changes in the spine. No destructive bone lesions. Review of the MIP images confirms the above findings. IMPRESSION: 1. No evidence of significant pulmonary embolus. 2. Diffuse emphysematous changes in the lungs. Diffuse alveolar infiltration in the right lung is probably due to pneumonia but asymmetric edema could also have this appearance. 3. Collapse of the left lower lung with abrupt termination of the left lower lobe bronchus, suspicious for endobronchial lesion. Consider bronchoscopy for further evaluation. 4. Masslike consolidation in the left upper lung with adjacent pleural effusion and tenting. This is highly suspicious for a mass lesion. 5. Esophageal dilatation likely due to dysmotility although distal stricture not excluded. Electronically Signed   By: WLucienne CapersM.D.   On: 01/18/2016 21:27  Mr Thoracic Spine Wo Contrast  01/27/2016  CLINICAL DATA:  Back pain. EXAM: MRI THORACIC AND LUMBAR SPINE WITHOUT CONTRAST TECHNIQUE: Multiplanar and multiecho pulse sequences of the thoracic and lumbar spine were obtained without intravenous contrast. COMPARISON:  Chest CTA 01/18/2016. FINDINGS: MR THORACIC SPINE FINDINGS Vertebral alignment is normal. Multiple Schmorl's node deformities are again seen throughout the mid and lower thoracic spine. No significant vertebral marrow edema is seen. No destructive osseous lesion is identified on this unenhanced study. The thoracic spinal cord is normal in caliber and signal. Left lower lobe collapse is more fully evaluated on recent CT. There is a small left pleural effusion. Mild disc bulging is present at T2-3 and T3-4 without stenosis. Right-sided anterior endplate spurring is present throughout the thoracic spine. There is asymmetric, mild to moderate left facet arthrosis at T6-7 without evidence of neural impingement. MR LUMBAR SPINE  FINDINGS Numbering is continued from the thoracic spine, counting down from the craniocervical junction. The conus medullaris terminates at the inferior aspect of L1 on the thoracic study. The yields 6 lumbar type vertebral bodies, and the lowest will be considered a lumbarized S1. Vertebral alignment is normal. Schmorl's nodes are noted at L1-2. No significant vertebral marrow edema is seen. Disc desiccation is present from L1-2-L5-S1. Mild type 2 degenerative endplate changes are present at L2-3 with anterior endplate osteophytosis noted. There is mild disc space narrowing at L1-2 greater than L2-3, and there is at most minimal narrowing L5-S1. Paraspinal soft tissues are unremarkable. The cecum and ascending colon are prominent in size and likely contain a large amount of stool. L1-2 through L4-5: No disc herniation or stenosis. L5-S1: Mild disc bulging, shallow right subarticular disc protrusion, and moderate facet arthrosis result in mild right lateral recess stenosis. No spinal canal or neural foraminal stenosis. S1-2: Mild right and moderate left facet arthrosis without disc herniation or stenosis. IMPRESSION: MR THORACIC SPINE IMPRESSION 1. Mild thoracic spondylosis without stenosis. 2. Multiple chronic Schmorl's node deformities. MR LUMBAR SPINE IMPRESSION 1. Transitional lumbosacral anatomy as above. 2. Mild lumbar spondylosis and moderate lower lumbar facet arthrosis. Mild right lateral recess stenosis at L4-5. Electronically Signed   By: Logan Bores M.D.   On: 01/27/2016 18:50   Mr Lumbar Spine Wo Contrast  01/27/2016  CLINICAL DATA:  Back pain. EXAM: MRI THORACIC AND LUMBAR SPINE WITHOUT CONTRAST TECHNIQUE: Multiplanar and multiecho pulse sequences of the thoracic and lumbar spine were obtained without intravenous contrast. COMPARISON:  Chest CTA 01/18/2016. FINDINGS: MR THORACIC SPINE FINDINGS Vertebral alignment is normal. Multiple Schmorl's node deformities are again seen throughout the mid and  lower thoracic spine. No significant vertebral marrow edema is seen. No destructive osseous lesion is identified on this unenhanced study. The thoracic spinal cord is normal in caliber and signal. Left lower lobe collapse is more fully evaluated on recent CT. There is a small left pleural effusion. Mild disc bulging is present at T2-3 and T3-4 without stenosis. Right-sided anterior endplate spurring is present throughout the thoracic spine. There is asymmetric, mild to moderate left facet arthrosis at T6-7 without evidence of neural impingement. MR LUMBAR SPINE FINDINGS Numbering is continued from the thoracic spine, counting down from the craniocervical junction. The conus medullaris terminates at the inferior aspect of L1 on the thoracic study. The yields 6 lumbar type vertebral bodies, and the lowest will be considered a lumbarized S1. Vertebral alignment is normal. Schmorl's nodes are noted at L1-2. No significant vertebral marrow edema is seen. Disc desiccation is present  from L1-2-L5-S1. Mild type 2 degenerative endplate changes are present at L2-3 with anterior endplate osteophytosis noted. There is mild disc space narrowing at L1-2 greater than L2-3, and there is at most minimal narrowing L5-S1. Paraspinal soft tissues are unremarkable. The cecum and ascending colon are prominent in size and likely contain a large amount of stool. L1-2 through L4-5: No disc herniation or stenosis. L5-S1: Mild disc bulging, shallow right subarticular disc protrusion, and moderate facet arthrosis result in mild right lateral recess stenosis. No spinal canal or neural foraminal stenosis. S1-2: Mild right and moderate left facet arthrosis without disc herniation or stenosis. IMPRESSION: MR THORACIC SPINE IMPRESSION 1. Mild thoracic spondylosis without stenosis. 2. Multiple chronic Schmorl's node deformities. MR LUMBAR SPINE IMPRESSION 1. Transitional lumbosacral anatomy as above. 2. Mild lumbar spondylosis and moderate lower  lumbar facet arthrosis. Mild right lateral recess stenosis at L4-5. Electronically Signed   By: Logan Bores M.D.   On: 01/27/2016 18:50    Bronchoscopy 01/20/16 Impression: - Left mainstem mass  - Polypoid mass was found at the glottis. This lesion   is suspicious for malignancy.  - No specimens collected.   Filed Weights   01/21/16 2053 01/23/16 2015 01/24/16 2003  Weight: 73.6 kg (162 lb 4.1 oz) 72.1 kg (158 lb 15.2 oz) 70.8 kg (156 lb 1.4 oz)     Microbiology: Recent Results (from the past 240 hour(s))  Culture, sputum-assessment     Status: None   Collection Time: 01/19/16  7:45 AM  Result Value Ref Range Status   Specimen Description SPUTUM  Final   Special Requests Normal  Final   Sputum evaluation   Final    MICROSCOPIC FINDINGS SUGGEST THAT THIS SPECIMEN IS NOT REPRESENTATIVE OF LOWER RESPIRATORY SECRETIONS. PLEASE RECOLLECT. Gram Stain Report Called to,Read Back By and Verified With: T HUBBARD,RN AT 0852 01/19/16 BY L BENFIELD    Report Status 01/19/2016 FINAL  Final       Blood Culture    Component Value Date/Time   SDES SPUTUM 01/19/2016 0745   SPECREQUEST Normal 01/19/2016 0745   CULT  02/19/2013 1508    ABUNDANT STAPHYLOCOCCUS AUREUS Note: RIFAMPIN AND GENTAMICIN SHOULD NOT BE USED AS SINGLE DRUGS FOR TREATMENT OF STAPH INFECTIONS.   REPTSTATUS 01/19/2016 FINAL 01/19/2016 0745      Labs: Results for orders placed or performed during the hospital encounter of 01/18/16 (from the past 48 hour(s))  Glucose, capillary     Status: Abnormal   Collection Time: 01/26/16 12:04 PM  Result Value Ref Range   Glucose-Capillary 282 (H) 65 - 99 mg/dL  Glucose, capillary     Status: Abnormal   Collection Time: 01/26/16  4:47 PM  Result Value Ref Range   Glucose-Capillary 256 (H) 65 - 99 mg/dL  Glucose, capillary     Status: Abnormal   Collection Time: 01/26/16  8:11 PM  Result Value Ref Range    Glucose-Capillary 207 (H) 65 - 99 mg/dL   Comment 1 Notify RN    Comment 2 Document in Chart   Glucose, capillary     Status: Abnormal   Collection Time: 01/27/16 12:43 AM  Result Value Ref Range   Glucose-Capillary 213 (H) 65 - 99 mg/dL   Comment 1 Notify RN    Comment 2 Document in Chart   Glucose, capillary     Status: Abnormal   Collection Time: 01/27/16  3:54 AM  Result Value Ref Range   Glucose-Capillary 187 (H) 65 - 99 mg/dL  Comment 1 Notify RN    Comment 2 Document in Chart   Glucose, capillary     Status: Abnormal   Collection Time: 01/27/16  9:57 AM  Result Value Ref Range   Glucose-Capillary 249 (H) 65 - 99 mg/dL  Glucose, capillary     Status: Abnormal   Collection Time: 01/27/16 11:35 AM  Result Value Ref Range   Glucose-Capillary 173 (H) 65 - 99 mg/dL  Glucose, capillary     Status: Abnormal   Collection Time: 01/27/16  4:18 PM  Result Value Ref Range   Glucose-Capillary 151 (H) 65 - 99 mg/dL  Glucose, capillary     Status: Abnormal   Collection Time: 01/27/16  8:55 PM  Result Value Ref Range   Glucose-Capillary 159 (H) 65 - 99 mg/dL  Glucose, capillary     Status: Abnormal   Collection Time: 01/27/16 11:57 PM  Result Value Ref Range   Glucose-Capillary 142 (H) 65 - 99 mg/dL  Glucose, capillary     Status: None   Collection Time: 01/28/16  3:56 AM  Result Value Ref Range   Glucose-Capillary 97 65 - 99 mg/dL  Comprehensive metabolic panel     Status: Abnormal   Collection Time: 01/28/16  5:00 AM  Result Value Ref Range   Sodium 134 (L) 135 - 145 mmol/L   Potassium 4.4 3.5 - 5.1 mmol/L   Chloride 97 (L) 101 - 111 mmol/L   CO2 27 22 - 32 mmol/L   Glucose, Bld 113 (H) 65 - 99 mg/dL   BUN 15 6 - 20 mg/dL   Creatinine, Ser 0.73 0.61 - 1.24 mg/dL   Calcium 9.0 8.9 - 10.3 mg/dL   Total Protein 5.6 (L) 6.5 - 8.1 g/dL   Albumin 3.2 (L) 3.5 - 5.0 g/dL   AST 12 (L) 15 - 41 U/L   ALT 13 (L) 17 - 63 U/L   Alkaline Phosphatase 68 38 - 126 U/L   Total  Bilirubin 0.6 0.3 - 1.2 mg/dL   GFR calc non Af Amer >60 >60 mL/min   GFR calc Af Amer >60 >60 mL/min    Comment: (NOTE) The eGFR has been calculated using the CKD EPI equation. This calculation has not been validated in all clinical situations. eGFR's persistently <60 mL/min signify possible Chronic Kidney Disease.    Anion gap 10 5 - 15  Glucose, capillary     Status: Abnormal   Collection Time: 01/28/16  8:02 AM  Result Value Ref Range   Glucose-Capillary 157 (H) 65 - 99 mg/dL     Lipid Panel     Component Value Date/Time   CHOL  12/25/2008 0500    148        ATP III CLASSIFICATION:  <200     mg/dL   Desirable  200-239  mg/dL   Borderline High  >=240    mg/dL   High          TRIG 138 12/25/2008 0500   HDL 27* 12/25/2008 0500   CHOLHDL 5.5 12/25/2008 0500   VLDL 28 12/25/2008 0500   LDLCALC  12/25/2008 0500    93        Total Cholesterol/HDL:CHD Risk Coronary Heart Disease Risk Table                     Men   Women  1/2 Average Risk   3.4   3.3  Average Risk       5.0   4.4  2 X Average Risk   9.6   7.1  3 X Average Risk  23.4   11.0        Use the calculated Patient Ratio above and the CHD Risk Table to determine the patient's CHD Risk.        ATP III CLASSIFICATION (LDL):  <100     mg/dL   Optimal  100-129  mg/dL   Near or Above                    Optimal  130-159  mg/dL   Borderline  160-189  mg/dL   High  >190     mg/dL   Very High     Lab Results  Component Value Date   HGBA1C 6.3* 01/19/2016   HGBA1C * 12/25/2008    6.4 (NOTE)   The ADA recommends the following therapeutic goal for glycemic   control related to Hgb A1C measurement:   Goal of Therapy:   < 7.0% Hgb A1C   Reference: American Diabetes Association: Clinical Practice   Recommendations 2008, Diabetes Care,  2008, 31:(Suppl 1).     Lab Results  Component Value Date   [facility]  12/25/2008    93        Total Cholesterol/HDL:CHD Risk Coronary Heart Disease Risk Table                      Men   Women  1/2 Average Risk   3.4   3.3  Average Risk       5.0   4.4  2 X Average Risk   9.6   7.1  3 X Average Risk  23.4   11.0        Use the calculated Patient Ratio above and the CHD Risk Table to determine the patient's CHD Risk.        ATP III CLASSIFICATION (LDL):  <100     mg/dL   Optimal  100-129  mg/dL   Near or Above                    Optimal  130-159  mg/dL   Borderline  160-189  mg/dL   High  >190     mg/dL   Very High   CREATININE 0.73 01/28/2016     60 y.o. male presented to [facility] ED 03/22 with several months duration of progressively worsening cough, mixed with non productive and productive cough of yellowish sputum, competed several rounds of ABX but seems like symptoms initially get better and soon after d/c ABX, symptoms worse. This has been associated with hoarseness, some weight loss, poor oral intaje and malaise. He reports he quit smoking 1 weeks ago and has been using nicotine patch since.   In ED, pt had CTA of the chest which was negative for PE but did reveal collapse of LLL with abrupt termination of LLL bronchus suspicious for endobronchial lesion. Pt also had masslike consolidation of the LUL with adjacent pleural effusion and suspicious for mass lesion. In addition. He had diffuse emphysematous changes. Pt was admitted by [facility] and PCCM was called for further evaluation / consideration of bronchoscopy.  Assessment/Plan:    Principal Problem:  Dyspnea due to Mass of lower lobe of left lung - Presumed LLL endobronchial lesion with additional mass lesion and probable malignant effusion, ? CAP Status post bronchoscopy of the left mainstem 3/24 /17 , found to have a large polypoid mass epiglottis, partially  obstructing the airway. Biopsy results pending.  microdirect laryngoscopy with CO2 laser, debulking of laryngeal mass, and biopsy on 3/30 by Dr. Redmond Baseman Given empiric CAP coverage. Completed Levaquin started  on  3/26 - Supplemental O2 as needed  to maintain SpO2 > 92%. Stable on 3 L - tobacco cessation counseling provided  Follow-up with pulmonary and ENT, follow-up on the results of the biopsy, patient would need outpatient PET scan   Back pain MRI thoracic and lumbar spine shows mild thoracic spondylosis without stenosis, mild lumbar spondylosis, no evidence of metastatic disease Robaxin for back pain   Pulmonary emphysema ([place]) - provide BD's scheduled and as needed , continue prednisone -Continue empiric CAP coverage. Currently on Levaquin started 3/26-for 10 days  Discontinued IV Solu-Medrol now off steroids    Hyponatremia/ siadh - improved with IVF but also possibly related to malignant process  - continue to monitor    DM type 2 (diabetes mellitus, type 2) (HCC) - Resume metformin on discharge, Accu-Cheks stable   patient needed Lantus during this hospitalization due to receiving IV steroids   Tobacco abuse - counseled on cessation    Essential hypertension - reasonable inpatient control   Paranoid schizophrenia Continue Clozaril 100 daily at bedtime, continue Depakote 500 a.m. 1000 p.m  Depakote levels Therapeutic           Discharge Exam:    Blood pressure 130/83, pulse 75, temperature 97.8 F (36.6 C), temperature source Oral, resp. rate 18, height 5' 10"  (1.778 m), weight 70.8 kg (156 lb 1.4 oz), SpO2 100 %.   General: Pt is alert, follows commands appropriately, not in acute distress  Cardiovascular: Regular rate and rhythm, S1/S2, no murmurs, no rubs, no gallops  Respiratory: course breath sounds with rhonchi at the left base   Abdomen: Soft, non tender, non distended, bowel sounds present, no guarding  Extremities: pulses DP and PT palpable bilaterally  Neuro: Grossly nonfocal        Follow-up Information    Follow up with BATES, DWIGHT, MD. Schedule an appointment as soon as possible for a visit in 1 week.   Specialty:  Otolaryngology   Contact information:    [address]  [place] [address] [phone]       Follow up with PCP. Schedule an appointment as soon as possible for a visit in 3 days.   Why:  Post hospital follow-up      Signed: Azad Calame 01/28/2016, 8:52 AM        Time spent >45 mins

## 2016-01-29 DIAGNOSIS — M5440 Lumbago with sciatica, unspecified side: Secondary | ICD-10-CM

## 2016-01-29 LAB — GLUCOSE, CAPILLARY
GLUCOSE-CAPILLARY: 181 mg/dL — AB (ref 65–99)
Glucose-Capillary: 121 mg/dL — ABNORMAL HIGH (ref 65–99)
Glucose-Capillary: 121 mg/dL — ABNORMAL HIGH (ref 65–99)
Glucose-Capillary: 170 mg/dL — ABNORMAL HIGH (ref 65–99)

## 2016-01-29 NOTE — Clinical Social Work Note (Signed)
Clinical Social Work Assessment  Patient Details  Name: Chad Wang MRN: [id number] Date of Birth: 11-May-1956  Date of referral:  01/29/16               Reason for consult:  Facility Placement                Permission sought to share information with:  Family Supports Permission granted to share information::  Yes, Verbal Permission Granted  Name::     Chad Wang  Agency::     Relationship::  Sister  Contact Information:  [phone]  Housing/Transportation Living arrangements for the past 2 months:  Apartment Source of Information:  Patient Patient Interpreter Needed:  None Criminal Activity/Legal Involvement Pertinent to Current Situation/Hospitalization:  No - Comment as needed Significant Relationships:  Siblings Lives with:  Self Do you feel safe going back to the place where you live?  No Need for family participation in patient care:  Yes (Comment) (Patient states that he wants his sister Chad Wang involved in his decision for placement. )  Care giving concerns:  Patient's original discharge plan included returning home with home health care. It was later identified by medical team that patient lives in a 2 story home with bedroom on the 2nd floor with no one in the home that can provide assistance. Patient is now desiring SNF placement for short term rehab. Patient agreeable to being faxed to facilities in [facility]).   Social Worker assessment / plan:  CSW will fax patient information to facilities in [facility] and coordinate the discharge process to facility of choice and availability.  Employment status:  Disabled (Comment on whether or not currently receiving Disability) Insurance information:  Medicare, Medicaid In [place] PT Recommendations:  L[place] / Referral to community resources:  [place]  Patient/Family's Response to care: Patient open and agreeable to placement for rehab and  support.   Patient/Family's Understanding of and Emotional Response to Diagnosis, Current Treatment, and Prognosis:  Patient anticipates recovery to be able to return to independent living.   Emotional Assessment Appearance:  Appears stated age Attitude/Demeanor/Rapport:   (pleasant) Affect (typically observed):  Accepting, Appropriate Orientation:  Oriented to Self, Oriented to Place, Oriented to  Time, Oriented to Situation Alcohol / Substance use:  Not Applicable Psych involvement (Current and /or in the community):  No (Comment)  Discharge Needs  Concerns to be addressed:  No discharge needs identified Readmission within the last 30 days:  No Current discharge risk:  None Barriers to Discharge:  No Barriers Identified   Danylle Ouk, Daneil Dolin, LCSW 01/29/2016, 3:01 PM

## 2016-01-29 NOTE — Progress Notes (Signed)
Incentive spirometry given to patient and instructed on how to use.  Patient returned demonstration.

## 2016-01-29 NOTE — Progress Notes (Signed)
Occupational Therapy Evaluation Patient Details Name: Chad Wang MRN: [id number] DOB: 03/02/1956 Today's Date: 01/29/2016    History of Present Illness 60 y.o. male with pulmonary emphysema presented to [facility] ED 03/22 with several months duration of progressively worsening cough, mixed with non productive and productive cough of yellowish sputum, competed several rounds of ABX but seems like symptoms initially get better and soon after d/c ABX, symptoms worse. This has been associated with hoarseness, some weight loss, poor oral intaje and malaise.  workup reveals mass in lower lobe of left lung.     Clinical Impression   PTA, pt was independent with ADLs and mobility. Pt currently requires set up assist for ADLs and supervision for transfers and ambulation due to dyspnea, decreasing O2 and balance deficits. Began energy conservation education with pt. Pt will benefit from continued acute OT to increase independence and safety with ADLs and mobility to allow for safe discharge to the venue listed below. Recommend SNF for post-acute rehab stay due to pt's lack of support at home. Will continue to follow acutely.    Follow Up Recommendations  SNF    Equipment Recommendations  3 in 1 bedside comode;Other (comment) (RW-2 wheeled)    Recommendations for Other Services       Precautions / Restrictions Precautions Precautions: None Restrictions Weight Bearing Restrictions: No      Mobility Bed Mobility Overal bed mobility: Modified Independent                Transfers Overall transfer level: Needs assistance Equipment used: Rolling walker (2 wheeled) Transfers: Sit to/from Stand Sit to Stand: Supervision         General transfer comment: RW for safety. No LOB or reports of dizziness upon standing    Balance Overall balance assessment: Needs assistance Sitting-balance support: No upper extremity supported;Feet supported Sitting balance-Leahy Scale: Good      Standing balance support: No upper extremity supported;During functional activity Standing balance-Leahy Scale: Fair Standing balance comment: Able to maintain balance safely without UE support for static standing tasks, requires single extremity support for dynamic balance tasks.                            ADL Overall ADL's : Needs assistance/impaired     Grooming: Wash/dry hands;Supervision/safety;Oral care;Standing   Upper Body Bathing: Set up;Sitting   Lower Body Bathing: Set up;Sit to/from stand   Upper Body Dressing : Set up;Sitting   Lower Body Dressing: Set up;Sit to/from stand   Toilet Transfer: Supervision/safety;Ambulation;Regular Toilet;RW   Toileting- Clothing Manipulation and Hygiene: Supervision/safety;Sit to/from stand       Functional mobility during ADLs: Supervision/safety;Rolling walker General ADL Comments: Set up assistance for dressing/bathing/toileting tasks due to decreased activity tolerance, dyspnea and decreasing SpO2, no LOB or reports of dizziness. Pt's SpO2 on 2L [place] 93%, on RA 85%.      Vision Vision Assessment?: No apparent visual deficits   Perception     Praxis      Pertinent Vitals/Pain Pain Assessment: No/denies pain     Hand Dominance Left   Extremity/Trunk Assessment Upper Extremity Assessment Upper Extremity Assessment: Overall WFL for tasks assessed   Lower Extremity Assessment Lower Extremity Assessment: Overall WFL for tasks assessed   Cervical / Trunk Assessment Cervical / Trunk Assessment: Normal   Communication Communication Communication: No difficulties   Cognition Arousal/Alertness: Awake/alert Behavior During Therapy: WFL for tasks assessed/performed Overall Cognitive Status: Within Functional Limits for tasks  assessed                     General Comments       Exercises       Shoulder Instructions      Home Living Family/patient expects to be discharged to:: Private  residence Living Arrangements: Alone Available Help at Discharge: Available PRN/intermittently;Family;Friend(s) Type of Home: Apartment (2 levels) Home Access: Level entry     Home Layout: Two level Alternate Level Stairs-Number of Steps: 13 Alternate Level Stairs-Rails: Right Bathroom Shower/Tub: Tub/shower unit;Curtain Shower/tub characteristics: Architectural technologist: Standard     Home Equipment: None          Prior Functioning/Environment Level of Independence: Independent             OT Diagnosis: Other (comment) (Difficulty breathing)   OT Problem List: Decreased activity tolerance;Impaired balance (sitting and/or standing);Decreased safety awareness;Decreased knowledge of use of DME or AE   OT Treatment/Interventions: Self-care/ADL training;Therapeutic exercise;DME and/or AE instruction;Energy conservation;Therapeutic activities;Patient/family education;Balance training    OT Goals(Current goals can be found in the care plan section) Acute Rehab OT Goals Patient Stated Goal: get stronger and go home OT Goal Formulation: With patient Time For Goal Achievement: 02/12/16 Potential to Achieve Goals: Good ADL Goals Pt Will Transfer to Toilet: with modified independence;ambulating;regular height toilet Pt Will Perform Toileting - Clothing Manipulation and hygiene: with modified independence;sit to/from stand Pt Will Perform Tub/Shower Transfer: Tub transfer;ambulating;3 in 1;rolling walker Additional ADL Goal #1: Pt will verbalize and demonstrate 3 energy conservation strategies to increase independence with ADLs.  OT Frequency: Min 2X/week   Barriers to D/C: Decreased caregiver support  Lives alone       Co-evaluation              End of Session Equipment Utilized During Treatment: Gait belt;Rolling walker Nurse Communication: Mobility status  Activity Tolerance: Patient tolerated treatment well Patient left: in bed;with call bell/phone within  reach;Other (comment) (2L [place])   Time: 7517-0017 OT Time Calculation (min): 14 min Charges:  OT General Charges $OT Visit: 1 Procedure OT Evaluation $OT Eval Low Complexity: 1 Procedure G-Codes:    Redmond Baseman, OTR/L Pager: [phone] 01/29/2016, 5:15 PM

## 2016-01-29 NOTE — Progress Notes (Addendum)
Patient ID: Chad Wang, male   DOB: 02-03-1956, 60 y.o.   MRN: [id number] [facility] PROGRESS NOTE  Chad Wang YQM:[id number] DOB: [birth year] DOA: 01/18/2016 PCP: No PCP Per Patient   Brief narrative:    60 y.o. male presented to [facility] ED 03/22 with several months duration of progressively worsening cough, mixed with non productive and productive cough of yellowish sputum, competed several rounds of ABX but seems like symptoms initially get better and soon after d/c ABX, symptoms worse. This has been associated with hoarseness, some weight loss, poor oral intaje and malaise. He reports he quit smoking 1 weeks ago and has been using nicotine patch since.   In ED, pt had CTA of the chest which was negative for PE but did reveal collapse of LLL with abrupt termination of LLL bronchus suspicious for endobronchial lesion. Pt also had masslike consolidation of the LUL with adjacent pleural effusion and suspicious for mass lesion. In addition. He had diffuse emphysematous changes. Pt was admitted by [facility] and PCCM was called for further evaluation / consideration of bronchoscopy.  Assessment/Plan:    Principal Problem:  Dyspnea due to Mass of lower lobe of left lung - Presumed LLL endobronchial lesion with additional mass lesion and probable malignant effusion, ? 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 Biopsy results pending.  microdirect laryngoscopy with CO2 laser, debulking of laryngeal mass, and biopsy on 3/30 by Dr. Redmond Baseman Given empiric CAP coverage. Completed Levaquin started on 3/26 - Supplemental O2 as needed to maintain SpO2 > 92%. Stable on 3 L - tobacco cessation counseling provided  Follow-up with pulmonary and ENT, follow-up on the results of the biopsy, patient would need outpatient PET scan Needs SNF placement  Back pain MRI thoracic and lumbar spine shows mild  thoracic spondylosis without stenosis, mild lumbar spondylosis, no evidence of metastatic disease Robaxin for back pain   Pulmonary emphysema ([place]) - provide BD's scheduled and as needed , continue prednisone -Continue empiric CAP coverage. Currently on Levaquin started 3/26-for 10 days  Discontinued IV Solu-Medrol now off steroids    Hyponatremia/ siadh - improved with IVF but also possibly related to malignant process  - continue to monitor    DM type 2 (diabetes mellitus, type 2) (HCC) - Resume metformin on discharge, Accu-Cheks stable  patient needed Lantus during this hospitalization due to receiving IV steroids   Tobacco abuse - counseled on cessation    Essential hypertension - reasonable inpatient control   Paranoid schizophrenia Continue Clozaril 100 daily at bedtime, continue Depakote 500 a.m. 1000 p.m  Depakote levels Therapeutic   DVT prophylaxis - Lovenox SQ   Code Status: Full.  Family Communication:  plan of care discussed with the patient Disposition Plan: anticipate discharge tomorrow when SNF bed available    IV access:  Peripheral IV  Procedures and diagnostic studies:    Dg Chest 2 View 01/09/2016 New focal masslike opacity in the posterior left upper lung, cannot exclude pulmonary neoplasm. 2. Questionable mild thickening of the right paratracheal stripe, adenopathy not excluded.  Ct Angio Chest Pe W/cm &/or Wo Cm 01/18/2016   No evidence of significant pulmonary embolus. 2. Diffuse emphysematous changes in the lungs. Diffuse alveolar infiltration in the right lung is probably due to pneumonia but asymmetric edema could also have this appearance. 3. Collapse of the left lower lung with abrupt termination of the left lower lobe bronchus, suspicious  for endobronchial lesion. Consider bronchoscopy for further evaluation. 4. Masslike consolidation in the left upper lung with adjacent pleural effusion and tenting. This is highly suspicious for a  mass lesion. 5. Esophageal dilatation likely due to dysmotility although distal stricture not excluded.   Medical Consultants:  PCCM  Other Consultants:  None  IAnti-Infectives:    Anti-infectives    Start     Dose/Rate Route Frequency Ordered Stop   01/23/16 1000  levofloxacin (LEVAQUIN) tablet 500 mg  Status:  Discontinued     500 mg Oral Daily 01/22/16 1101 01/27/16 1226   01/20/16 2200  azithromycin (ZITHROMAX) tablet 500 mg  Status:  Discontinued     500 mg Oral Daily at bedtime 01/20/16 1201 01/22/16 1101   01/19/16 2200  cefTRIAXone (ROCEPHIN) 1 g in dextrose 5 % 50 mL IVPB  Status:  Discontinued     1 g 100 mL/hr over 30 Minutes Intravenous Every 24 hours 01/19/16 0045 01/22/16 1101   01/19/16 2200  azithromycin (ZITHROMAX) 500 mg in dextrose 5 % 250 mL IVPB  Status:  Discontinued     500 mg 250 mL/hr over 60 Minutes Intravenous Every 24 hours 01/19/16 0045 01/20/16 1201   01/18/16 2145  azithromycin (ZITHROMAX) tablet 500 mg     500 mg Oral  Once 01/18/16 2144 01/18/16 2211   01/18/16 2145  cefTRIAXone (ROCEPHIN) 1 g in dextrose 5 % 50 mL IVPB     1 g 100 mL/hr over 30 Minutes Intravenous  Once 01/18/16 2144 01/18/16 2240       Laiylah Roettger, MD  [place] Pager [phone]  If 7PM-7AM, please contact night-coverage [email or web address] Password [email or web address] 01/29/2016, 12:00 PM   LOS: 11 days   HPI/Subjective: Minimal back pain, no cough , very week  Objective: Filed Vitals:   01/28/16 2012 01/28/16 2111 01/29/16 0518 01/29/16 0811  BP: 123/78  108/67   Pulse: 107  88   Temp: 98.6 F (37 C)  98.5 F (36.9 C)   TempSrc: Oral  Oral   Resp: 18  16   Height:      Weight:      SpO2: 94% 96% 95% 97%    Intake/Output Summary (Last 24 hours) at 01/29/16 1200 Last data filed at 01/29/16 0734  Gross per 24 hour  Intake    480 ml  Output   1325 ml  Net   -845 ml    Exam:   General:  Pt is alert, follows commands appropriately, not in acute distress  Cardiovascular:  Regular rate and rhythm, S1/S2, no murmurs, no rubs, no gallops  Respiratory: course breath sounds with rhonchi at the left base   Abdomen: Soft, non tender, non distended, bowel sounds present, no guarding  Extremities: pulses DP and PT palpable bilaterally  Neuro: Grossly nonfocal  Data Reviewed: Basic Metabolic Panel:  Recent Labs Lab 01/23/16 0728 01/24/16 0510 01/26/16 0550 01/28/16 0500  NA 135 135 138 134*  K 4.3 5.0 4.7 4.4  CL 101 98* 99* 97*  CO2 '25 27 28 27  '$ GLUCOSE 120* 102* 104* 113*  BUN '14 14 16 15  '$ CREATININE 0.71 0.70 0.74 0.73  CALCIUM 9.2 9.4 9.4 9.0   Liver Function Tests:  Recent Labs Lab 01/23/16 0728 01/24/16 0510 01/26/16 0550 01/28/16 0500  AST 16 16 13* 12*  ALT 15* 14* 14* 13*  ALKPHOS 84 80 74 68  BILITOT 0.2* 0.4 0.5 0.6  PROT 5.7* 5.7* 5.6* 5.6*  ALBUMIN 3.3* 3.4* 3.2* 3.2*  CBC:  Recent Labs Lab 01/23/16 0728 01/24/16 0510 01/25/16 0438 01/26/16 0550  WBC 12.6* 13.4* 13.5* 13.9*  NEUTROABS 8.7*  --   --   --   HGB 14.7 15.0 14.6 15.3  HCT 40.4 43.5 42.4 44.1  MCV 80.0 81.8 80.9 81.5  PLT 263 262 251 232   CBG:  Recent Labs Lab 01/28/16 1138 01/28/16 1715 01/28/16 2010 01/29/16 0805 01/29/16 1140  GLUCAP 198* 271* 151* 121* 121*    No results found for this or any previous visit (from the past 240 hour(s)).   Scheduled Meds: . amLODipine  10 mg Oral Daily  . antiseptic oral rinse  7 mL Mouth Rinse BID  . chlorpheniramine-HYDROcodone  5 mL Oral Q12H  . cloZAPine  100 mg Oral QHS  . divalproex  500 mg Oral Daily   And  . divalproex  1,000 mg Oral QHS  . docusate sodium  100 mg Oral BID  . enoxaparin (LOVENOX) injection  40 mg Subcutaneous Q24H  . feeding supplement (GLUCERNA SHAKE)  237 mL Oral BID BM  . gemfibrozil  600 mg Oral BID AC  . insulin aspart  0-5 Units Subcutaneous QHS  . insulin aspart  0-9 Units Subcutaneous TID WC  . insulin glargine  10 Units Subcutaneous Daily  . mometasone-formoterol   2 puff Inhalation BID  . nicotine  14 mg Transdermal Daily  . pantoprazole  40 mg Oral Daily   Continuous Infusions: . sodium chloride 10 mL/hr at 01/25/16 1610

## 2016-01-29 NOTE — Progress Notes (Signed)
SATURATION QUALIFICATIONS: (This note is used to comply with regulatory documentation for home oxygen)  Patient Saturations on Room Air at Rest = 92%  Patient Saturations on Room Air while Ambulating = 87%  Patient Saturations on 2 Liters of oxygen while Ambulating = 91%  Please briefly explain why patient needs home oxygen:

## 2016-01-29 NOTE — NC FL2 (Signed)
[place] LEVEL OF CARE SCREENING TOOL     IDENTIFICATION  Patient Name: Chad Wang Birthdate: January 13, 1956 Sex: male Admission Date (Current Location): 01/18/2016  [place] and [place] Number:  Kathleen Argue [id number] [place] and Address:  [place], [place] [address] [place], [place] [address]      Provider Number: [id number]  Attending Physician Name and Address:  Reyne Dumas, MD  Relative Name and Phone Number:       Current Level of Care: Hospital Recommended Level of Care: [place] Prior Approval Number:    Date Approved/Denied:   PASRR Number: [id number]  Discharge Plan: SNF    Current Diagnoses: Patient Active Problem List   Diagnosis Date Noted  . Back pain   . Laryngeal mass   . Malnutrition of moderate degree 01/19/2016  . Mass of lower lobe of left lung 01/19/2016  . Essential hypertension 01/19/2016  . Pulmonary emphysema ([place])   . Community acquired pneumonia 01/18/2016  . Hyponatremia 01/18/2016  . DM type 2 (diabetes mellitus, type 2) ([place]) 01/18/2016  . Tobacco abuse 01/18/2016    Orientation RESPIRATION BLADDER Height & Weight     Self, Time, Situation, Place  Normal Continent Weight: 156 lb 1.4 oz (70.8 kg) Height:  '5\' 10"'$  (177.8 cm)  BEHAVIORAL SYMPTOMS/MOOD NEUROLOGICAL BOWEL NUTRITION STATUS      Continent Diet  AMBULATORY STATUS COMMUNICATION OF NEEDS Skin   Limited Assist Verbally Normal                       Personal Care Assistance Level of Assistance  Total care       Total Care Assistance: Limited assistance   Functional Limitations Info             SPECIAL CARE FACTORS FREQUENCY  PT (By licensed PT)     PT Frequency: 5x weekly              Contractures Contractures Info: Not present    Additional Factors Info  Code Status Code Status Info: Full             Current Medications (01/29/2016):  This is the current hospital active medication  list Current Facility-Administered Medications  Medication Dose Route Frequency Provider Last Rate Last Dose  . 0.9 %  sodium chloride infusion   Intravenous Continuous Kara Mead V, MD 10 mL/hr at 01/25/16 1610    . albuterol (PROVENTIL) (2.5 MG/3ML) 0.083% nebulizer solution 2.5 mg  2.5 mg Nebulization Q2H PRN Toy Baker, MD   2.5 mg at 01/27/16 1836  . amLODipine (NORVASC) tablet 10 mg  10 mg Oral Daily Toy Baker, MD   10 mg at 01/29/16 1017  . antiseptic oral rinse (CPC / CETYLPYRIDINIUM CHLORIDE 0.05%) solution 7 mL  7 mL Mouth Rinse BID Toy Baker, MD   7 mL at 01/29/16 1000  . chlorpheniramine-HYDROcodone (TUSSIONEX) 10-8 MG/5ML suspension 5 mL  5 mL Oral Q12H Saima Rizwan, MD   5 mL at 01/29/16 1020  . cloZAPine (CLOZARIL) tablet 100 mg  100 mg Oral QHS Toy Baker, MD   100 mg at 01/28/16 2251  . divalproex (DEPAKOTE) DR tablet 500 mg  500 mg Oral Daily Toy Baker, MD   500 mg at 01/29/16 1017   And  . divalproex (DEPAKOTE) DR tablet 1,000 mg  1,000 mg Oral QHS Toy Baker, MD   1,000 mg at 01/28/16 2252  . docusate sodium (COLACE) capsule 100 mg  100 mg Oral BID Reyne Dumas, MD   100 mg at 01/29/16 1017  . enoxaparin (LOVENOX) injection 40 mg  40 mg Subcutaneous Q24H Tawni Millers, MD   40 mg at 01/29/16 1330  . feeding supplement (GLUCERNA SHAKE) (GLUCERNA SHAKE) liquid 237 mL  237 mL Oral BID BM Theodis Blaze, MD   237 mL at 01/29/16 1400  . gemfibrozil (LOPID) tablet 600 mg  600 mg Oral BID AC Toy Baker, MD   600 mg at 01/29/16 0735  . insulin aspart (novoLOG) injection 0-5 Units  0-5 Units Subcutaneous QHS Dianne Dun, NP   0 Units at 01/28/16 2252  . insulin aspart (novoLOG) injection 0-9 Units  0-9 Units Subcutaneous TID WC Dianne Dun, NP   1 Units at 01/29/16 1211  . insulin glargine (LANTUS) injection 10 Units  10 Units Subcutaneous Daily Reyne Dumas, MD   10 Units at 01/29/16 1020  .  ipratropium-albuterol (DUONEB) 0.5-2.5 (3) MG/3ML nebulizer solution 3 mL  3 mL Nebulization Q6H PRN Debbe Odea, MD      . methocarbamol (ROBAXIN) 500 mg in dextrose 5 % 50 mL IVPB  500 mg Intravenous Q8H PRN Reyne Dumas, MD      . mometasone-formoterol (DULERA) 200-5 MCG/ACT inhaler 2 puff  2 puff Inhalation BID Debbe Odea, MD   2 puff at 01/29/16 0811  . nicotine (NICODERM CQ - dosed in mg/24 hours) patch 14 mg  14 mg Transdermal Daily Debbe Odea, MD   14 mg at 01/29/16 1019  . pantoprazole (PROTONIX) EC tablet 40 mg  40 mg Oral Daily Toy Baker, MD   40 mg at 01/29/16 1017     Discharge Medications: Please see discharge summary for a list of discharge medications.  Relevant Imaging Results:  Relevant Lab Results:   Additional Information    [place]ades Barbaro, Daneil Dolin, LCSW

## 2016-01-30 DIAGNOSIS — M5489 Other dorsalgia: Secondary | ICD-10-CM

## 2016-01-30 LAB — COMPREHENSIVE METABOLIC PANEL
ALK PHOS: 55 U/L (ref 38–126)
ALT: 9 U/L — AB (ref 17–63)
AST: 13 U/L — ABNORMAL LOW (ref 15–41)
Albumin: 2.5 g/dL — ABNORMAL LOW (ref 3.5–5.0)
Anion gap: 8 (ref 5–15)
BUN: 10 mg/dL (ref 6–20)
CALCIUM: 8.1 mg/dL — AB (ref 8.9–10.3)
CO2: 27 mmol/L (ref 22–32)
CREATININE: 0.64 mg/dL (ref 0.61–1.24)
Chloride: 96 mmol/L — ABNORMAL LOW (ref 101–111)
GFR calc non Af Amer: >60 mL/min (ref >60)
GLUCOSE: 240 mg/dL — AB (ref 65–99)
Potassium: 4.4 mmol/L (ref 3.5–5.1)
SODIUM: 131 mmol/L — AB (ref 135–145)
Total Bilirubin: 0.6 mg/dL (ref 0.3–1.2)
Total Protein: 5 g/dL — ABNORMAL LOW (ref 6.5–8.1)

## 2016-01-30 LAB — CBC
HCT: 39.6 % (ref 39.0–52.0)
HEMOGLOBIN: 13.8 g/dL (ref 13.0–17.0)
MCH: 28.9 pg (ref 26.0–34.0)
MCHC: 34.8 g/dL (ref 30.0–36.0)
MCV: 83 fL (ref 78.0–100.0)
Platelets: 153 10*3/uL (ref 150–400)
RBC: 4.77 MIL/uL (ref 4.22–5.81)
RDW: 14.2 % (ref 11.5–15.5)
WBC: 11.9 10*3/uL — ABNORMAL HIGH (ref 4.0–10.5)

## 2016-01-30 LAB — GLUCOSE, CAPILLARY
GLUCOSE-CAPILLARY: 144 mg/dL — AB (ref 65–99)
Glucose-Capillary: 120 mg/dL — ABNORMAL HIGH (ref 65–99)

## 2016-01-30 LAB — DIFFERENTIAL
BASOS PCT: 0 %
Basophils Absolute: 0 10*3/uL (ref 0.0–0.1)
EOS ABS: 0.1 10*3/uL (ref 0.0–0.7)
EOS PCT: 1 %
Lymphocytes Relative: 18 %
Lymphs Abs: 2.1 10*3/uL (ref 0.7–4.0)
Monocytes Absolute: 1.5 10*3/uL — ABNORMAL HIGH (ref 0.1–1.0)
Monocytes Relative: 13 %
Neutro Abs: 7.9 10*3/uL — ABNORMAL HIGH (ref 1.7–7.7)
Neutrophils Relative %: 68 %

## 2016-01-30 NOTE — Progress Notes (Signed)
Report called to [place] at [employer].  All questions answered.

## 2016-01-30 NOTE — Clinical Social Work Placement (Signed)
   CLINICAL SOCIAL WORK PLACEMENT  NOTE  Date:  01/30/2016  Patient Details  Name: BRAXSTON QUINTER MRN: [id number] Date of Birth: 12-17-1955  Clinical Social Work is seeking post-discharge placement for this patient at the [place] level of care (*CSW will initial, date and re-position this form in  chart as items are completed):  Yes   Patient/family provided with [facility]'s list of facilities offering this level of care within the geographic area requested by the patient (or if unable, by the patient's family).  Yes   Patient/family informed of their freedom to choose among providers that offer the needed level of care, that participate in Medicare, Medicaid or managed care program needed by the patient, have an available bed and are willing to accept the patient.  Yes   Patient/family informed of [facility]'s ownership interest in [facility] and [facility], as well as of the fact that they are under no obligation to receive care at these facilities.  PASRR submitted to [facility] on 01/29/16     PASRR number received on 01/29/16     Existing PASRR number confirmed on       FL2 transmitted to all facilities in geographic area requested by pt/family on 01/29/16     FL2 transmitted to all facilities within larger geographic area on 01/29/16     Patient informed that his/her managed care company has contracts with or will negotiate with certain facilities, including the following:        Yes   Patient/family informed of bed offers received.  Patient chooses bed at [facility]     Physician recommends and patient chooses bed at      Patient to be transferred to [facility] on 01/30/16.  Patient to be transferred to facility by [facility]     Patient family notified on 01/30/16 of transfer.  Name of family member notified:  Orinda Kenner     PHYSICIAN       Additional Comment:     _______________________________________________ Caroline Sauger, LCSW 01/30/2016, 1:56 PM

## 2016-01-30 NOTE — Care Management Important Message (Signed)
Important Message  Patient Details  Name: ADARIAN BUR MRN: [id number] Date of Birth: 04-Nov-1955   Medicare Important Message Given:  Yes    [name] [place] 01/30/2016, 1:34 PM

## 2016-01-30 NOTE — Discharge Summary (Addendum)
Physician Discharge Summary  Chad Wang MRN: [id number] DOB/AGE: Feb 26, 1956 60 y.o.  PCP: No PCP Per Patient   Admit date: 01/18/2016 Discharge date: 01/30/2016  Discharge Diagnoses:     Principal Problem:   Mass of lower lobe of left lung Active Problems:   Community acquired pneumonia   Hyponatremia   DM type 2 (diabetes mellitus, type 2) (HCC)   Tobacco abuse   Pulmonary emphysema (HCC)   Malnutrition of moderate degree   Essential hypertension   Back pain   Laryngeal mass    Follow-up recommendations Follow-up with PCP in 3-5 days , including all  additional recommended appointments as below Follow-up CBC, CMP in 3-5 days Patient to follow-up with Dr. Redmond Baseman, ENT for hospital follow-up, follow-up on the results of his biopsy, patient needs outpatient PET scan to be arranged for by Dr. Redmond Baseman Patient needs to follow-up with pulmonary Dr. 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     amLODipine 10 MG tablet  Commonly known as:  NORVASC  Take 10 mg by mouth daily.     chlorpheniramine-HYDROcodone 10-8 MG/5ML Suer  Commonly known as:  TUSSIONEX  Take 5 mLs by mouth every 12 (twelve) hours.     cloZAPine 100 MG tablet  Commonly known as:  CLOZARIL  Take 100 mg by mouth at bedtime.     divalproex 500 MG DR tablet  Commonly known as:  DEPAKOTE  Take 500-1,000 mg by mouth 2 (two) times daily. Takes 5109m in am and 10065min pm     docusate sodium 100 MG  capsule  Commonly known as:  COLACE  Take 1 capsule (100 mg total) by mouth 2 (two) times daily.     feeding supplement (GLUCERNA SHAKE) Liqd  Take 237 mLs by mouth 2 (two) times daily between meals.     gemfibrozil 600 MG tablet  Commonly known as:  LOPID  Take 600 mg by mouth 2 (two) times daily.     metFORMIN 1000 MG tablet  Commonly known as:  GLUCOPHAGE  Take 500 mg by mouth 2 (two) times daily with a meal.     mometasone-formoterol 200-5 MCG/ACT Aero  Commonly known as:  DULERA  Inhale 2 puffs into the lungs 2 (two) times daily.     neomycin-polymyxin-hydrocortisone otic solution  Commonly known as:  CORTISPORIN  Place 3 drops into the right ear 4 (four) times daily.     nicotine 14 mg/24hr patch  Commonly known as:  NICODERM CQ - dosed in mg/24 hours  Place 1 patch (14 mg total) onto the skin daily.     omeprazole 20 MG capsule  Commonly known as:  PRILOSEC  Take 20 mg by mouth daily.         Discharge Condition: Stable   Discharge Instructions Get Medicines reviewed and adjusted: Please take all your medications with you for your next visit with your Primary MD  Please  request your Primary MD to go over all hospital tests and procedure/radiological results at the follow up, please ask your Primary MD to get all Hospital records sent to his/her office.  If you experience worsening of your admission symptoms, develop shortness of breath, life threatening emergency, suicidal or homicidal thoughts you must seek medical attention immediately by calling 911 or calling your MD immediately if symptoms less severe.  You must read complete instructions/literature along with all the possible adverse reactions/side effects for all the Medicines you take and that have been prescribed to you. Take any new Medicines after you have completely understood and accpet all the possible adverse reactions/side effects.   Do not drive when taking Pain medications.   Do not take  more than prescribed Pain, Sleep and Anxiety Medications  Special Instructions: If you have smoked or chewed Tobacco in the last 2 yrs please stop smoking, stop any regular Alcohol and or any Recreational drug use.  Wear Seat belts while driving.  Please note  You were cared for by a hospitalist during your hospital stay. Once you are discharged, your primary care physician will handle any further medical issues. Please note that NO REFILLS for any discharge medications will be authorized once you are discharged, as it is imperative that you return to your primary care physician (or establish a relationship with a primary care physician if you do not have one) for your aftercare needs so that they can reassess your need for medications and monitor your lab values.  Discharge Instructions    Diet - low sodium heart healthy    Complete by:  As directed      Increase activity slowly    Complete by:  As directed            No Known Allergies    Disposition: 01-Home or Self Care   Consults: * ENT Pulmonary      Significant Diagnostic Studies:  Dg Chest 2 View  01/09/2016  CLINICAL DATA:  Gradual onset productive cough.  Current smoker. EXAM: CHEST  2 VIEW COMPARISON:  12/13/2012 chest radiograph. FINDINGS: Normal heart size. Mildly tortuous thoracic aorta. Questionable mild thickening of the right paratracheal stripe. No pneumothorax. No pleural effusion. There is a new focal masslike opacity in the posterior left upper lung. There is new hyperlucency throughout the left lung. No pulmonary edema. IMPRESSION: 1. New focal masslike opacity in the posterior left upper lung, cannot exclude pulmonary neoplasm. 2. Questionable mild thickening of the right paratracheal stripe, adenopathy not excluded. 3. Recommend further evaluation with chest CT with IV contrast. Electronically Signed   By: Ilona Sorrel M.D.   On: 01/09/2016 12:33   Ct Soft Tissue Neck W Contrast  01/20/2016  CLINICAL  DATA:  60 year old male with hoarseness since February. Laryngeal mass. Subsequent encounter. EXAM: CT NECK WITH CONTRAST TECHNIQUE: Multidetector CT imaging of the neck was performed using the standard protocol following the bolus administration of intravenous contrast. CONTRAST:  67m OMNIPAQUE IOHEXOL 300 MG/ML  SOLN COMPARISON:  Head CT without contrast 12/13/2012. Chest CTA 01/18/2016. 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 The lesion encompasses 18 x 17 x 23 mm (AP by transverse by CC). See series 21, image 63 and sagittal image 66. The right aryepiglottic fold appears invaded on series 201, image 65. The false cords appear relatively spared. The true cords are unaffected. The tumor does not  involve the laryngeal cartilages but does track into the pre epiglottic fat. Pharyngeal soft tissue contours are within normal limits. Negative parapharyngeal and retropharyngeal spaces. Salivary glands: Negative sublingual space, submandibular glands and parotid glands. Thyroid: Negative. Lymph nodes: Right side level 2 and level 3 lymph nodes are small, measuring 5-6 mm short axis, but are larger than those on the left. Compare sagittal images 44 and 45 on the right to images 87 and 88 on the left. There is also a conspicuous right level 4 lymph node measuring 7 mm short axis on series 21, image 89. No no larger nodes. Normal level 1 nodes and level 5 nodes. No cystic or necrotic nodes. Vascular: Major vascular structures in the neck and at the skullbase are patent. Calcified atherosclerosis at the skull base. Limited intracranial: Negative. Visualized orbits: Negative. Mastoids and visualized paranasal sinuses: Clear. Skeleton: Scattered poor dentition. Lower cervical spine osseous disc and endplate degeneration. No acute osseous abnormality identified. Upper chest: Stable from recent  chest CTA (please see that report). IMPRESSION: 1. 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 Invasion of the right AE fold suspected. No laryngeal cartilage or paralaryngeal soft tissue involvement. 2. Small but asymmetrically conspicuous right level 2 through level 4 lymph nodes, up to 7 mm short axis. Early nodal metastatic disease not excluded. 3. Abnormal upper chest as reported on 01/18/2016 (please see that report). 4. Scattered poor dentition. Electronically Signed   By: Genevie Ann M.D.   On: 01/20/2016 16:38   Ct Angio Chest Pe W/cm &/or Wo Cm  01/18/2016  CLINICAL DATA:  Cough and shortness of breath for many months. Nodular opacity seen in left upper lung on chest radiograph. EXAM: CT ANGIOGRAPHY CHEST WITH CONTRAST TECHNIQUE: Multidetector CT imaging of the chest was performed using the standard protocol during bolus administration of intravenous contrast. Multiplanar CT image reconstructions and MIPs were obtained to evaluate the vascular anatomy. CONTRAST:  95m OMNIPAQUE IOHEXOL 350 MG/ML SOLN COMPARISON:  Chest 01/09/2016 FINDINGS: Technically adequate study with good opacification of the central and segmental pulmonary arteries. No focal filling defects are demonstrated. No evidence of significant pulmonary embolus. Normal heart size. Mildly dilated esophagus with small air-fluid level. This may be due to dysmotility disorder. Prominent mediastinal lymph nodes with mild enlargement of a right paratracheal lymph node, measuring 16 mm short axis dimension. This is nonspecific in could be reactive or metastatic. There is diffuse emphysematous change throughout the lungs but more prominent in the left lung. Alveolar infiltrates in the right lung likely represent pneumonia but could indicate asymmetric edema. There is an abrupt termination to the left lower lobe bronchus with complete collapse of the left lower lung. This suggest an  endobronchial obstructing lesion. Bronchoscopy may be useful for further evaluation. Masslike consolidation in the left upper lung with associated left pleural effusion and tenting of the pleura. This measures about 3.8 cm maximal diameter. Appearance is suspicious for a mass lesion although a focal consolidation could potentially have this appearance. No pneumothorax. Included portions of the upper abdominal organs are grossly unremarkable. Degenerative changes in the spine. No destructive bone lesions. Review of the MIP images confirms the above findings. IMPRESSION: 1. No evidence of significant pulmonary embolus. 2. Diffuse emphysematous changes in the lungs. Diffuse alveolar infiltration in the right lung is probably due to pneumonia but asymmetric edema could also have this appearance. 3. Collapse of the left lower lung with abrupt  termination of the left lower lobe bronchus, suspicious for endobronchial lesion. Consider bronchoscopy for further evaluation. 4. Masslike consolidation in the left upper lung with adjacent pleural effusion and tenting. This is highly suspicious for a mass lesion. 5. Esophageal dilatation likely due to dysmotility although distal stricture not excluded. Electronically Signed   By: Lucienne Capers M.D.   On: 01/18/2016 21:27   Mr Thoracic Spine Wo Contrast  01/27/2016  CLINICAL DATA:  Back pain. EXAM: MRI THORACIC AND LUMBAR SPINE WITHOUT CONTRAST TECHNIQUE: Multiplanar and multiecho pulse sequences of the thoracic and lumbar spine were obtained without intravenous contrast. COMPARISON:  Chest CTA 01/18/2016. FINDINGS: MR THORACIC SPINE FINDINGS Vertebral alignment is normal. Multiple Schmorl's node deformities are again seen throughout the mid and lower thoracic spine. No significant vertebral marrow edema is seen. No destructive osseous lesion is identified on this unenhanced study. The thoracic spinal cord is normal in caliber and signal. Left lower lobe collapse is more  fully evaluated on recent CT. There is a small left pleural effusion. Mild disc bulging is present at T2-3 and T3-4 without stenosis. Right-sided anterior endplate spurring is present throughout the thoracic spine. There is asymmetric, mild to moderate left facet arthrosis at T6-7 without evidence of neural impingement. MR LUMBAR SPINE FINDINGS Numbering is continued from the thoracic spine, counting down from the craniocervical junction. The conus medullaris terminates at the inferior aspect of L1 on the thoracic study. The yields 6 lumbar type vertebral bodies, and the lowest will be considered a lumbarized S1. Vertebral alignment is normal. Schmorl's nodes are noted at L1-2. No significant vertebral marrow edema is seen. Disc desiccation is present from L1-2-L5-S1. Mild type 2 degenerative endplate changes are present at L2-3 with anterior endplate osteophytosis noted. There is mild disc space narrowing at L1-2 greater than L2-3, and there is at most minimal narrowing L5-S1. Paraspinal soft tissues are unremarkable. The cecum and ascending colon are prominent in size and likely contain a large amount of stool. L1-2 through L4-5: No disc herniation or stenosis. L5-S1: Mild disc bulging, shallow right subarticular disc protrusion, and moderate facet arthrosis result in mild right lateral recess stenosis. No spinal canal or neural foraminal stenosis. S1-2: Mild right and moderate left facet arthrosis without disc herniation or stenosis. IMPRESSION: MR THORACIC SPINE IMPRESSION 1. Mild thoracic spondylosis without stenosis. 2. Multiple chronic Schmorl's node deformities. MR LUMBAR SPINE IMPRESSION 1. Transitional lumbosacral anatomy as above. 2. Mild lumbar spondylosis and moderate lower lumbar facet arthrosis. Mild right lateral recess stenosis at L4-5. Electronically Signed   By: Logan Bores M.D.   On: 01/27/2016 18:50   Mr Lumbar Spine Wo Contrast  01/27/2016  CLINICAL DATA:  Back pain. EXAM: MRI THORACIC AND  LUMBAR SPINE WITHOUT CONTRAST TECHNIQUE: Multiplanar and multiecho pulse sequences of the thoracic and lumbar spine were obtained without intravenous contrast. COMPARISON:  Chest CTA 01/18/2016. FINDINGS: MR THORACIC SPINE FINDINGS Vertebral alignment is normal. Multiple Schmorl's node deformities are again seen throughout the mid and lower thoracic spine. No significant vertebral marrow edema is seen. No destructive osseous lesion is identified on this unenhanced study. The thoracic spinal cord is normal in caliber and signal. Left lower lobe collapse is more fully evaluated on recent CT. There is a small left pleural effusion. Mild disc bulging is present at T2-3 and T3-4 without stenosis. Right-sided anterior endplate spurring is present throughout the thoracic spine. There is asymmetric, mild to moderate left facet arthrosis at T6-7 without evidence of neural impingement. MR LUMBAR  SPINE FINDINGS Numbering is continued from the thoracic spine, counting down from the craniocervical junction. The conus medullaris terminates at the inferior aspect of L1 on the thoracic study. The yields 6 lumbar type vertebral bodies, and the lowest will be considered a lumbarized S1. Vertebral alignment is normal. Schmorl's nodes are noted at L1-2. No significant vertebral marrow edema is seen. Disc desiccation is present from L1-2-L5-S1. Mild type 2 degenerative endplate changes are present at L2-3 with anterior endplate osteophytosis noted. There is mild disc space narrowing at L1-2 greater than L2-3, and there is at most minimal narrowing L5-S1. Paraspinal soft tissues are unremarkable. The cecum and ascending colon are prominent in size and likely contain a large amount of stool. L1-2 through L4-5: No disc herniation or stenosis. L5-S1: Mild disc bulging, shallow right subarticular disc protrusion, and moderate facet arthrosis result in mild right lateral recess stenosis. No spinal canal or neural foraminal stenosis. S1-2:  Mild right and moderate left facet arthrosis without disc herniation or stenosis. IMPRESSION: MR THORACIC SPINE IMPRESSION 1. Mild thoracic spondylosis without stenosis. 2. Multiple chronic Schmorl's node deformities. MR LUMBAR SPINE IMPRESSION 1. Transitional lumbosacral anatomy as above. 2. Mild lumbar spondylosis and moderate lower lumbar facet arthrosis. Mild right lateral recess stenosis at L4-5. Electronically Signed   By: Logan Bores M.D.   On: 01/27/2016 18:50    Bronchoscopy 01/20/16 Impression: - Left mainstem mass  - Polypoid mass was found at the glottis. This lesion   is suspicious for malignancy.  - No specimens collected.   Filed Weights   01/21/16 2053 01/23/16 2015 01/24/16 2003  Weight: 73.6 kg (162 lb 4.1 oz) 72.1 kg (158 lb 15.2 oz) 70.8 kg (156 lb 1.4 oz)     Microbiology: No results found for this or any previous visit (from the past 240 hour(s)).     Blood Culture    Component Value Date/Time   SDES SPUTUM 01/19/2016 0745   SPECREQUEST Normal 01/19/2016 0745   CULT  02/19/2013 1508    ABUNDANT STAPHYLOCOCCUS AUREUS Note: RIFAMPIN AND GENTAMICIN SHOULD NOT BE USED AS SINGLE DRUGS FOR TREATMENT OF STAPH INFECTIONS.   REPTSTATUS 01/19/2016 FINAL 01/19/2016 0745      Labs: Results for orders placed or performed during the hospital encounter of 01/18/16 (from the past 48 hour(s))  Glucose, capillary     Status: Abnormal   Collection Time: 01/28/16  5:15 PM  Result Value Ref Range   Glucose-Capillary 271 (H) 65 - 99 mg/dL  Glucose, capillary     Status: Abnormal   Collection Time: 01/28/16  8:10 PM  Result Value Ref Range   Glucose-Capillary 151 (H) 65 - 99 mg/dL  Glucose, capillary     Status: Abnormal   Collection Time: 01/29/16  8:05 AM  Result Value Ref Range   Glucose-Capillary 121 (H) 65 - 99 mg/dL  Glucose, capillary     Status: Abnormal   Collection Time: 01/29/16 11:40  AM  Result Value Ref Range   Glucose-Capillary 121 (H) 65 - 99 mg/dL  Glucose, capillary     Status: Abnormal   Collection Time: 01/29/16  5:12 PM  Result Value Ref Range   Glucose-Capillary 181 (H) 65 - 99 mg/dL  Glucose, capillary     Status: Abnormal   Collection Time: 01/29/16  9:18 PM  Result Value Ref Range   Glucose-Capillary 170 (H) 65 - 99 mg/dL  Glucose, capillary     Status: Abnormal   Collection Time: 01/30/16  7:42 AM  Result Value Ref Range   Glucose-Capillary 120 (H) 65 - 99 mg/dL  CBC     Status: Abnormal   Collection Time: 01/30/16  9:14 AM  Result Value Ref Range   WBC 11.9 (H) 4.0 - 10.5 K/uL   RBC 4.77 4.22 - 5.81 MIL/uL   Hemoglobin 13.8 13.0 - 17.0 g/dL   HCT 39.6 39.0 - 52.0 %   MCV 83.0 78.0 - 100.0 fL   MCH 28.9 26.0 - 34.0 pg   MCHC 34.8 30.0 - 36.0 g/dL   RDW 14.2 11.5 - 15.5 %   Platelets 153 150 - 400 K/uL  Comprehensive metabolic panel     Status: Abnormal   Collection Time: 01/30/16  9:14 AM  Result Value Ref Range   Sodium 131 (L) 135 - 145 mmol/L   Potassium 4.4 3.5 - 5.1 mmol/L   Chloride 96 (L) 101 - 111 mmol/L   CO2 27 22 - 32 mmol/L   Glucose, Bld 240 (H) 65 - 99 mg/dL   BUN 10 6 - 20 mg/dL   Creatinine, Ser 0.64 0.61 - 1.24 mg/dL   Calcium 8.1 (L) 8.9 - 10.3 mg/dL   Total Protein 5.0 (L) 6.5 - 8.1 g/dL   Albumin 2.5 (L) 3.5 - 5.0 g/dL   AST 13 (L) 15 - 41 U/L   ALT 9 (L) 17 - 63 U/L   Alkaline Phosphatase 55 38 - 126 U/L   Total Bilirubin 0.6 0.3 - 1.2 mg/dL   GFR calc non Af Amer >60 >60 mL/min   GFR calc Af Amer >60 >60 mL/min    Comment: (NOTE) The eGFR has been calculated using the CKD EPI equation. This calculation has not been validated in all clinical situations. eGFR's persistently <60 mL/min signify possible Chronic Kidney Disease.    Anion gap 8 5 - 15  Glucose, capillary     Status: Abnormal   Collection Time: 01/30/16 11:17 AM  Result Value Ref Range   Glucose-Capillary 144 (H) 65 - 99 mg/dL   Comment 1 Notify  RN    Comment 2 Document in Chart      Lipid Panel     Component Value Date/Time   CHOL  12/25/2008 0500    148        ATP III CLASSIFICATION:  <200     mg/dL   Desirable  200-239  mg/dL   Borderline High  >=240    mg/dL   High          TRIG 138 12/25/2008 0500   HDL 27* 12/25/2008 0500   CHOLHDL 5.5 12/25/2008 0500   VLDL 28 12/25/2008 0500   LDLCALC  12/25/2008 0500    93        Total Cholesterol/HDL:CHD Risk Coronary Heart Disease Risk Table                     Men   Women  1/2 Average Risk   3.4   3.3  Average Risk       5.0   4.4  2 X Average Risk   9.6   7.1  3 X Average Risk  23.4   11.0        Use the calculated Patient Ratio above and the CHD Risk Table to determine the patient's CHD Risk.        ATP III CLASSIFICATION (LDL):  <100     mg/dL   Optimal  100-129  mg/dL   Near or  Above                    Optimal  130-159  mg/dL   Borderline  160-189  mg/dL   High  >190     mg/dL   Very High     Lab Results  Component Value Date   HGBA1C 6.3* 01/19/2016   HGBA1C * 12/25/2008    6.4 (NOTE)   The ADA recommends the following therapeutic goal for glycemic   control related to Hgb A1C measurement:   Goal of Therapy:   < 7.0% Hgb A1C   Reference: American Diabetes Association: Clinical Practice   Recommendations 2008, Diabetes Care,  2008, 31:(Suppl 1).     Lab Results  Component Value Date   [facility]  12/25/2008    93        Total Cholesterol/HDL:CHD Risk Coronary Heart Disease Risk Table                     Men   Women  1/2 Average Risk   3.4   3.3  Average Risk       5.0   4.4  2 X Average Risk   9.6   7.1  3 X Average Risk  23.4   11.0        Use the calculated Patient Ratio above and the CHD Risk Table to determine the patient's CHD Risk.        ATP III CLASSIFICATION (LDL):  <100     mg/dL   Optimal  100-129  mg/dL   Near or Above                    Optimal  130-159  mg/dL   Borderline  160-189  mg/dL   High  >190     mg/dL   Very High    CREATININE 0.64 01/30/2016     60 y.o. male presented to [facility] ED 03/22 with several months duration of progressively worsening cough, mixed with non productive and productive cough of yellowish sputum, competed several rounds of ABX but seems like symptoms initially get better and soon after d/c ABX, symptoms worse. This has been associated with hoarseness, some weight loss, poor oral intaje and malaise. He reports he quit smoking 1 weeks ago and has been using nicotine patch since.   In ED, pt had CTA of the chest which was negative for PE but did reveal collapse of LLL with abrupt termination of LLL bronchus suspicious for endobronchial lesion. Pt also had masslike consolidation of the LUL with adjacent pleural effusion and suspicious for mass lesion. In addition. He had diffuse emphysematous changes. Pt was admitted by [facility] and PCCM was called for further evaluation / consideration of bronchoscopy.  Assessment/Plan:    Principal Problem:  Dyspnea due to Mass of lower lobe of left lung - Presumed LLL endobronchial lesion with additional mass lesion and probable malignant effusion, ? 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 Biopsy results pending.  microdirect laryngoscopy with CO2 laser, debulking of laryngeal mass, and biopsy on 3/30 by Dr. Redmond Baseman Given empiric CAP coverage. Completed Levaquin started  on  3/26 - Supplemental O2 as needed to maintain SpO2 > 92%. Stable on 3 L - tobacco cessation counseling provided  Follow-up with pulmonary and ENT,Discussed with Dr. Redmond Baseman about the biopsy results on the day of discharge. Dr. Redmond Baseman  will call the family and discuss the results of the biopsy and the plan of care, phone number for the sister provided to Dr Redmond Baseman .patient would need outpatient PET scan Dr. Redmond Baseman recommends follow-up with radiation oncology and hematology oncology will fax  referral to new patient referral line-Order placed   Back pain MRI thoracic and lumbar spine shows mild thoracic spondylosis without stenosis, mild lumbar spondylosis, no evidence of metastatic disease Robaxin for back pain   Pulmonary emphysema ([place]) - provide BD's scheduled and as needed , continue prednisone -Continue empiric CAP coverage. Currently on Levaquin started 3/26-for 10 days  Discontinued IV Solu-Medrol now off steroids    Hyponatremia/ siadh - improved with IVF but also possibly related to malignant process  - continue to monitor    DM type 2 (diabetes mellitus, type 2) (HCC) - Resume metformin on discharge, Accu-Cheks stable   patient needed Lantus during this hospitalization due to receiving IV steroids   Tobacco abuse - counseled on cessation    Essential hypertension - reasonable inpatient control   Paranoid schizophrenia Continue Clozaril 100 daily at bedtime, continue Depakote 500 a.m. 1000 p.m  Depakote levels Therapeutic           Discharge Exam:    Blood pressure 125/60, pulse 78, temperature 99 F (37.2 C), temperature source Oral, resp. rate 18, height 5' 10"  (1.778 m), weight 70.8 kg (156 lb 1.4 oz), SpO2 98 %.   General: Pt is alert, follows commands appropriately, not in acute distress  Cardiovascular: Regular rate and rhythm, S1/S2, no murmurs, no rubs, no gallops  Respiratory: course breath sounds with rhonchi at the left base   Abdomen: Soft, non tender, non distended, bowel sounds present, no guarding  Extremities: pulses DP and PT palpable bilaterally  Neuro: Grossly nonfocal    Follow-up Information    Follow up with BATES, DWIGHT, MD. Schedule an appointment as soon as possible for a visit in 1 week.   Specialty:  Otolaryngology   Contact information:   [address] [place] [address] [phone]       Follow up with PCP. Schedule an appointment as soon as possible for a visit in  3 days.   Why:  Post hospital follow-up      Follow up with Rigoberto Noel., MD. Schedule an appointment as soon as possible for a visit in 1 week.   Specialty:  Pulmonary Disease   Why:  Follow-up on the lung mass   Contact information:   [address] [address] [phone]       Signed: Reyne Dumas 01/30/2016, 1:25 PM        Time spent >45 mins

## 2016-01-30 NOTE — Care Management Note (Signed)
Case Management Note  Patient Details  Name: KIETH HARTIS MRN: [id number] Date of Birth: [birth year]  Subjective/Objective:              CM following for progression and d/c  Planning.    Action/Plan: 01/30/2016 Noted plan for d/c to SNF, CSW working on this. CM will no further input.   Expected Discharge Date:       01/30/2016           Expected Discharge Plan:  Skilled Nursing Facility  In-House Referral:  Clinical Social Work  Discharge planning Services  CM Consult  Post Acute Care Choice:   NA Choice offered to:   NA  DME Arranged:   NA DME Agency:   NA  HH Arranged:   NA HH Agency:   NA  Status of Service:  Completed, signed off  Medicare Important Message Given:  Yes Date Medicare IM Given:    Medicare IM give by:    Date Additional Medicare IM Given:    Additional Medicare Important Message give by:     If discussed at [place] of Stay Meetings, dates discussed:    Additional Comments:  Adron Bene, RN 01/30/2016, 10:32 AM

## 2016-01-30 NOTE — Clinical Social Work Note (Signed)
Patient to be discharged to [facility]. Patient's sister, Orinda Kenner, updated regarding discharge. Patient to be transported via EMS.  RN report number: [place], [place] [facility]: [phone] Surgical: [phone]

## 2016-01-31 ENCOUNTER — Emergency Department (HOSPITAL_COMMUNITY): Payer: Medicare Other

## 2016-01-31 ENCOUNTER — Inpatient Hospital Stay (HOSPITAL_COMMUNITY)
Admission: EM | Admit: 2016-01-31 | Discharge: 2016-02-07 | DRG: 208 | Disposition: A | Discharge status: Skilled Nursing Facility | Payer: Medicare Other | Attending: Internal Medicine | Admitting: Internal Medicine

## 2016-01-31 ENCOUNTER — Encounter (HOSPITAL_COMMUNITY): Payer: Self-pay | Admitting: Emergency Medicine

## 2016-01-31 DIAGNOSIS — K219 Gastro-esophageal reflux disease without esophagitis: Secondary | ICD-10-CM | POA: Diagnosis present

## 2016-01-31 DIAGNOSIS — J9690 Respiratory failure, unspecified, unspecified whether with hypoxia or hypercapnia: Secondary | ICD-10-CM

## 2016-01-31 DIAGNOSIS — R918 Other nonspecific abnormal finding of lung field: Secondary | ICD-10-CM | POA: Diagnosis not present

## 2016-01-31 DIAGNOSIS — E785 Hyperlipidemia, unspecified: Secondary | ICD-10-CM | POA: Diagnosis present

## 2016-01-31 DIAGNOSIS — Z79899 Other long term (current) drug therapy: Secondary | ICD-10-CM

## 2016-01-31 DIAGNOSIS — C3492 Malignant neoplasm of unspecified part of left bronchus or lung: Secondary | ICD-10-CM

## 2016-01-31 DIAGNOSIS — L8990 Pressure ulcer of unspecified site, unspecified stage: Secondary | ICD-10-CM | POA: Insufficient documentation

## 2016-01-31 DIAGNOSIS — F17210 Nicotine dependence, cigarettes, uncomplicated: Secondary | ICD-10-CM | POA: Diagnosis present

## 2016-01-31 DIAGNOSIS — J9811 Atelectasis: Secondary | ICD-10-CM | POA: Diagnosis present

## 2016-01-31 DIAGNOSIS — Z8249 Family history of ischemic heart disease and other diseases of the circulatory system: Secondary | ICD-10-CM

## 2016-01-31 DIAGNOSIS — Z833 Family history of diabetes mellitus: Secondary | ICD-10-CM

## 2016-01-31 DIAGNOSIS — E871 Hypo-osmolality and hyponatremia: Secondary | ICD-10-CM | POA: Diagnosis present

## 2016-01-31 DIAGNOSIS — R0602 Shortness of breath: Secondary | ICD-10-CM | POA: Diagnosis not present

## 2016-01-31 DIAGNOSIS — J189 Pneumonia, unspecified organism: Secondary | ICD-10-CM | POA: Diagnosis present

## 2016-01-31 DIAGNOSIS — Z978 Presence of other specified devices: Secondary | ICD-10-CM

## 2016-01-31 DIAGNOSIS — R042 Hemoptysis: Secondary | ICD-10-CM | POA: Diagnosis not present

## 2016-01-31 DIAGNOSIS — J449 Chronic obstructive pulmonary disease, unspecified: Secondary | ICD-10-CM

## 2016-01-31 DIAGNOSIS — Z23 Encounter for immunization: Secondary | ICD-10-CM

## 2016-01-31 DIAGNOSIS — E1165 Type 2 diabetes mellitus with hyperglycemia: Secondary | ICD-10-CM | POA: Diagnosis present

## 2016-01-31 DIAGNOSIS — C329 Malignant neoplasm of larynx, unspecified: Secondary | ICD-10-CM | POA: Diagnosis present

## 2016-01-31 DIAGNOSIS — F209 Schizophrenia, unspecified: Secondary | ICD-10-CM | POA: Diagnosis present

## 2016-01-31 DIAGNOSIS — Z801 Family history of malignant neoplasm of trachea, bronchus and lung: Secondary | ICD-10-CM | POA: Diagnosis not present

## 2016-01-31 DIAGNOSIS — Z7984 Long term (current) use of oral hypoglycemic drugs: Secondary | ICD-10-CM | POA: Diagnosis not present

## 2016-01-31 DIAGNOSIS — E119 Type 2 diabetes mellitus without complications: Secondary | ICD-10-CM

## 2016-01-31 DIAGNOSIS — C3490 Malignant neoplasm of unspecified part of unspecified bronchus or lung: Secondary | ICD-10-CM | POA: Diagnosis not present

## 2016-01-31 DIAGNOSIS — J440 Chronic obstructive pulmonary disease with acute lower respiratory infection: Secondary | ICD-10-CM | POA: Diagnosis present

## 2016-01-31 DIAGNOSIS — J9601 Acute respiratory failure with hypoxia: Secondary | ICD-10-CM | POA: Diagnosis present

## 2016-01-31 DIAGNOSIS — T380X5A Adverse effect of glucocorticoids and synthetic analogues, initial encounter: Secondary | ICD-10-CM | POA: Diagnosis present

## 2016-01-31 DIAGNOSIS — I10 Essential (primary) hypertension: Secondary | ICD-10-CM | POA: Diagnosis present

## 2016-01-31 DIAGNOSIS — R0902 Hypoxemia: Secondary | ICD-10-CM

## 2016-01-31 DIAGNOSIS — Z4659 Encounter for fitting and adjustment of other gastrointestinal appliance and device: Secondary | ICD-10-CM

## 2016-01-31 DIAGNOSIS — C3402 Malignant neoplasm of left main bronchus: Secondary | ICD-10-CM | POA: Diagnosis present

## 2016-01-31 DIAGNOSIS — J9600 Acute respiratory failure, unspecified whether with hypoxia or hypercapnia: Secondary | ICD-10-CM | POA: Diagnosis present

## 2016-01-31 DIAGNOSIS — R05 Cough: Secondary | ICD-10-CM | POA: Diagnosis not present

## 2016-01-31 DIAGNOSIS — C801 Malignant (primary) neoplasm, unspecified: Secondary | ICD-10-CM | POA: Diagnosis not present

## 2016-01-31 LAB — CBC WITH DIFFERENTIAL/PLATELET
Basophils Absolute: 0 10*3/uL (ref 0.0–0.1)
Basophils Relative: 0 %
EOS PCT: 0 %
Eosinophils Absolute: 0 10*3/uL (ref 0.0–0.7)
HCT: 38.7 % — ABNORMAL LOW (ref 39.0–52.0)
HEMOGLOBIN: 13.4 g/dL (ref 13.0–17.0)
LYMPHS ABS: 0.8 10*3/uL (ref 0.7–4.0)
LYMPHS PCT: 3 %
MCH: 28.1 pg (ref 26.0–34.0)
MCHC: 34.6 g/dL (ref 30.0–36.0)
MCV: 81.1 fL (ref 78.0–100.0)
MONOS PCT: 8 %
Monocytes Absolute: 1.8 10*3/uL — ABNORMAL HIGH (ref 0.1–1.0)
NEUTROS PCT: 89 %
Neutro Abs: 20.9 10*3/uL — ABNORMAL HIGH (ref 1.7–7.7)
Platelets: 160 10*3/uL (ref 150–400)
RBC: 4.77 MIL/uL (ref 4.22–5.81)
RDW: 14 % (ref 11.5–15.5)
WBC: 23.5 10*3/uL — AB (ref 4.0–10.5)

## 2016-01-31 LAB — I-STAT ARTERIAL BLOOD GAS, ED
BICARBONATE: 25.1 meq/L — AB (ref 20.0–24.0)
O2 Saturation: 94 %
PO2 ART: 69 mmHg — AB (ref 80.0–100.0)
TCO2: 26 mmol/L (ref 0–100)
pCO2 arterial: 40 mmHg (ref 35.0–45.0)
pH, Arterial: 7.405 (ref 7.350–7.450)

## 2016-01-31 LAB — COMPREHENSIVE METABOLIC PANEL
ALBUMIN: 2.8 g/dL — AB (ref 3.5–5.0)
ALK PHOS: 66 U/L (ref 38–126)
ALT: 14 U/L — ABNORMAL LOW (ref 17–63)
ANION GAP: 12 (ref 5–15)
AST: 39 U/L (ref 15–41)
BILIRUBIN TOTAL: 1.2 mg/dL (ref 0.3–1.2)
BUN: 12 mg/dL (ref 6–20)
CALCIUM: 8.4 mg/dL — AB (ref 8.9–10.3)
CO2: 21 mmol/L — AB (ref 22–32)
Chloride: 92 mmol/L — ABNORMAL LOW (ref 101–111)
Creatinine, Ser: 0.63 mg/dL (ref 0.61–1.24)
GFR calc Af Amer: >60 mL/min (ref >60)
GFR calc non Af Amer: >60 mL/min (ref >60)
GLUCOSE: 184 mg/dL — AB (ref 65–99)
Potassium: 5.1 mmol/L (ref 3.5–5.1)
SODIUM: 125 mmol/L — AB (ref 135–145)
TOTAL PROTEIN: 5.5 g/dL — AB (ref 6.5–8.1)

## 2016-01-31 LAB — ABO/RH: ABO/RH(D): O POS

## 2016-01-31 LAB — I-STAT CG4 LACTIC ACID, ED: Lactic Acid, Venous: 1.33 mmol/L (ref 0.5–2.0)

## 2016-01-31 LAB — PROTIME-INR
INR: 1.34 (ref 0.00–1.49)
Prothrombin Time: 16.7 seconds — ABNORMAL HIGH (ref 11.6–15.2)

## 2016-01-31 LAB — BRAIN NATRIURETIC PEPTIDE: B Natriuretic Peptide: 69.8 pg/mL (ref 0.0–100.0)

## 2016-01-31 LAB — I-STAT TROPONIN, ED: TROPONIN I, POC: 0.01 ng/mL (ref 0.00–0.08)

## 2016-01-31 LAB — LIPASE, BLOOD: Lipase: 17 U/L (ref 11–51)

## 2016-01-31 MED ORDER — VANCOMYCIN HCL IN DEXTROSE 1-5 GM/200ML-% IV SOLN
1000.0000 mg | Freq: Once | INTRAVENOUS | Status: AC
  Administered 2016-02-01: 1000 mg via INTRAVENOUS
  Filled 2016-01-31: qty 200

## 2016-01-31 MED ORDER — VANCOMYCIN HCL IN DEXTROSE 1-5 GM/200ML-% IV SOLN
1000.0000 mg | Freq: Two times a day (BID) | INTRAVENOUS | Status: DC
  Administered 2016-02-01 – 2016-02-03 (×6): 1000 mg via INTRAVENOUS
  Filled 2016-01-31 (×8): qty 200

## 2016-01-31 MED ORDER — ALBUTEROL SULFATE (2.5 MG/3ML) 0.083% IN NEBU: 5.0000 mg | INHALATION_SOLUTION | Freq: Once | RESPIRATORY_TRACT | Status: DC

## 2016-01-31 MED ORDER — PIPERACILLIN-TAZOBACTAM 3.375 G IVPB 30 MIN
3.3750 g | Freq: Once | INTRAVENOUS | Status: AC
  Administered 2016-02-01: 3.375 g via INTRAVENOUS
  Filled 2016-01-31: qty 50

## 2016-01-31 NOTE — ED Notes (Signed)
Pt unable to void at this time. 

## 2016-01-31 NOTE — Progress Notes (Signed)
Pharmacy Antibiotic Note  Chad Wang is a 61 y.o. male admitted on 01/31/2016 with SOB, h/o LLL lung mass and possible PNA.  Pharmacy has been consulted for Vancomycin dosing.  Plan: Vancomycin 1000 mg IV now and then q12h  Height: '5\' 10"'$  (177.8 cm) Weight: 154 lb (69.854 kg) IBW/kg (Calculated) : 73  No data recorded.   Recent Labs Lab 01/25/16 0438 01/26/16 0550 01/28/16 0500 01/30/16 0914 01/31/16 2242 01/31/16 2300  WBC 13.5* 13.9*  --  11.9* 23.5*  --   CREATININE  --  0.74 0.73 0.64 0.63  --   LATICACIDVEN  --   --   --   --   --  1.33    Estimated Creatinine Clearance: 95.9 mL/min (by C-G formula based on Cr of 0.63).    No Known Allergies  Antimicrobials this admission: Vancomycin 4/4 >> Zosyn 4/4 >>   Caryl Pina 01/31/2016 11:43 PM

## 2016-01-31 NOTE — H&P (Signed)
PULMONARY / CRITICAL CARE MEDICINE   Name: Chad Wang MRN: [id number] DOB: [birth year]    ADMISSION DATE:  01/31/2016 CONSULTATION DATE: 01/31/16   REFERRING MD:  Tanna Furry  CHIEF COMPLAINT:  Acute respiratory failure  HISTORY OF PRESENT ILLNESS:   Chad Wang is a 60 year old male with PMH notable for recent diagnosis of invasive squamous cell carcinoma of epiglottis which was diagnosed and partially resected on 01/25/16.  During that hospitalization he was also treated for a post obstructive PNA with Levaquin.  He was discharged yesterday on 01/30/16 and reports that he was overall feeling better and recovering with plenty of sleep.  This afternoon he got up out of bed to go to the bathroom and was very short of breath which prompted him to call EMS.  In route he was given breathing treatments and solumedrol, and was placed on a non re-breather.  In the ED he was noted to have diminished breath sounds on the left side and a chest x-ray showing a complete opacification of the left hemithorax.  He was placed on BiPAP and PCCM was consulted to admit.  He is currently comfortable on bipap and responding to questions.  PAST MEDICAL HISTORY :  He  has a past medical history of Schizophrenia ([place]); Hypertension; Hyperlipemia; GERD (gastroesophageal reflux disease); and Diabetes mellitus without complication ([place]).  PAST SURGICAL HISTORY: He  has past surgical history that includes Knee arthroscopy; Video bronchoscopy (Bilateral, 01/20/2016); and Microlaryngoscopy with co2 laser and excision of vocal cord lesion (N/A, 01/25/2016).  No Known Allergies  No current facility-administered medications on file prior to encounter.   Current Outpatient Prescriptions on File Prior to Encounter  Medication Sig  . albuterol (PROVENTIL HFA;VENTOLIN HFA) 108 (90 Base) MCG/ACT inhaler Inhale 2 puffs into the lungs every 6 (six) hours as needed for wheezing or shortness of breath.  Marland Kitchen amLODipine (NORVASC)  10 MG tablet Take 10 mg by mouth daily.  . chlorpheniramine-HYDROcodone (TUSSIONEX) 10-8 MG/5ML SUER Take 5 mLs by mouth every 12 (twelve) hours.  . cloZAPine (CLOZARIL) 100 MG tablet Take 100 mg by mouth at bedtime.   . divalproex (DEPAKOTE) 500 MG DR tablet Take 500-1,000 mg by mouth 2 (two) times daily. Takes '500mg'$  in am and '1000mg'$  in pm  . docusate sodium (COLACE) 100 MG capsule Take 1 capsule (100 mg total) by mouth 2 (two) times daily.  . feeding supplement, GLUCERNA SHAKE, (GLUCERNA SHAKE) LIQD Take 237 mLs by mouth 2 (two) times daily between meals.  Marland Kitchen gemfibrozil (LOPID) 600 MG tablet Take 600 mg by mouth 2 (two) times daily.  . metFORMIN (GLUCOPHAGE) 1000 MG tablet Take 500 mg by mouth 2 (two) times daily with a meal.   . mometasone-formoterol (DULERA) 200-5 MCG/ACT AERO Inhale 2 puffs into the lungs 2 (two) times daily.  Marland Kitchen neomycin-polymyxin-hydrocortisone (CORTISPORIN) otic solution Place 3 drops into the right ear 4 (four) times daily.  . nicotine (NICODERM CQ - DOSED IN MG/24 HOURS) 14 mg/24hr patch Place 1 patch (14 mg total) onto the skin daily.  Marland Kitchen omeprazole (PRILOSEC) 20 MG capsule Take 20 mg by mouth daily.    FAMILY HISTORY:  His indicated that his mother is alive. He indicated that his father is deceased. He indicated that his brother is alive.   SOCIAL HISTORY: He  reports that he has been smoking.  He does not have any smokeless tobacco history on file. He reports that he drinks alcohol. He reports that he does not use  illicit drugs.  REVIEW OF SYSTEMS:   Review of Systems  Constitutional: Negative for fever and chills.  Respiratory: Positive for cough and shortness of breath. Negative for sputum production.   Cardiovascular: Positive for chest pain (left side).  Gastrointestinal: Negative for abdominal pain.  Genitourinary: Negative for dysuria.  Neurological: Negative for headaches.  All other systems reviewed and are negative.    SUBJECTIVE:  No current  complaints, feels much better now on NRB.  VITAL SIGNS: Pulse 108  Resp 28  Ht '5\' 10"'$  (1.778 m)  Wt 154 lb (69.854 kg)  BMI 22.10 kg/m2  SpO2 95%  HEMODYNAMICS:    VENTILATOR SETTINGS:    INTAKE / OUTPUT:    PHYSICAL EXAMINATION: General:  On Bipap no distress Neuro:  Awake alert and moving all 4 extremities HEENT:  bipap mask in place Cardiovascular:  Tachycardic, 3/6 systolic murmur Lungs:  Right anterior and posterior  Lung fields clear, no air movement in left side Abdomen:  Soft non tender Musculoskeletal:  No edema Skin:  Warm to touch  LABS:  BMET  Recent Labs Lab 01/28/16 0500 01/30/16 0914 01/31/16 2242  NA 134* 131* 125*  K 4.4 4.4 5.1  CL 97* 96* 92*  CO2 27 27 21*  BUN '15 10 12  '$ CREATININE 0.73 0.64 0.63  GLUCOSE 113* 240* 184*    Electrolytes  Recent Labs Lab 01/28/16 0500 01/30/16 0914 01/31/16 2242  CALCIUM 9.0 8.1* 8.4*    CBC  Recent Labs Lab 01/26/16 0550 01/30/16 0914 01/31/16 2242  WBC 13.9* 11.9* 23.5*  HGB 15.3 13.8 13.4  HCT 44.1 39.6 38.7*  PLT 232 153 160    Coag's  Recent Labs Lab 01/31/16 2242  INR 1.34    Sepsis Markers  Recent Labs Lab 01/31/16 2300  LATICACIDVEN 1.33    ABG  Recent Labs Lab 01/31/16 2341  PHART 7.405  PCO2ART 40.0  PO2ART 69.0*    Liver Enzymes  Recent Labs Lab 01/28/16 0500 01/30/16 0914 01/31/16 2242  AST 12* 13* 39  ALT 13* 9* 14*  ALKPHOS 68 55 66  BILITOT 0.6 0.6 1.2  ALBUMIN 3.2* 2.5* 2.8*    Cardiac Enzymes No results for input(s): TROPONINI, PROBNP in the last 168 hours.  Glucose  Recent Labs Lab 01/29/16 0805 01/29/16 1140 01/29/16 1712 01/29/16 2118 01/30/16 0742 01/30/16 1117  GLUCAP 121* 121* 181* 170* 120* 144*    Imaging Dg Chest Port 1 View  01/31/2016  CLINICAL DATA:  Dyspnea. Discharge from hospital yesterday with recent diagnosis of lung mass. EXAM: PORTABLE CHEST 1 VIEW COMPARISON:  Chest CT 01/18/2016 FINDINGS: Complete  opacification of the left hemithorax with volume loss, question of tiny aerated portion of left upper lobe. There is leftward tracheal deviation. Cardiac in mediastinal contours are obscured. Progressive opacity in the right mid and lower lung zone compared to prior exam, worsening pneumonia, edema, or less likely neoplasm. No evidence of pneumothorax. IMPRESSION: 1. Complete opacification of the left hemithorax with volume loss and tracheal deviation, consistent with multilobar collapse/atelectasis. Patient with known endobronchial lesion and left lung mass. 2. Worsening opacity in the right and mid lower lung zone, asymmetric pulmonary edema, infection, or less likely spread of malignancy. Electronically Signed   By: Jeb Levering M.D.   On: 01/31/2016 23:33     STUDIES:  01/31/16 pCXR : Complete opacification of the left hemithorax and tracheal deviation, worsening opacity in the right and mid lower lung zone. 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 01/27/16 Echo: LVEF 60-65%, grade 1 DD, Mild aortic regurg, mild tricuspid regurg   CULTURES: BCx 01/31/16  ANTIBIOTICS: Vancomycin 02/01/16>> Zosyn 02/01/16>>  SIGNIFICANT EVENTS: 3/24 Bronchoscopy- Dr Elsworth Soho: aborted due to partially obstructing mass found at the glottis 01/25/16 Laryngoscopy with debulking of laryngeal mass and biopsy by Dr Redmond Baseman  LINES/TUBES: PIV  DISCUSSION: 60 year old male recently found to have a 90% obstructing epiglottis squamous cell carcinoma tumor that was de-bulked presenting with acute respiratory failure and found to have left lung collapse.  ASSESSMENT / PLAN:  PULMONARY A: Recent Partially Obstructing Epiglottis mass s/p debulking Concern for  endobronchial lesion and possible Left upper lobe mass Collapse of left lung: post obstructive atelectasis versus PNA Possible emphysema  P:   - Patient currently stable on Bipap, will continue for now may need to be intubated later tonight if worsens, patient agreeable to intubation. 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 P:   -Check Urine/Serum Osm and urine lytes - BMP Q4H  GASTROINTESTINAL A:   No acute issues P:     HEMATOLOGIC A:   Squamous cell CA of Epiglottis  P:  - Needs bronchoscopy to evaluate other bronchial mass  INFECTIOUS A:   Leukocytosis Possible Post Obstructive PNA P:   -Start broad spectrum Antibiotics with Vancomycin and Zosyn  ENDOCRINE A:   Controlled Type 2 DM  P:   -Start Q4 SSI - Hold metformin  NEUROLOGIC A:   Schizophrenia P:   -Hold home antipsychotics, restart as soon as able  RASS goal: 0    FAMILY  - Updates:  No family at bedside, patient reports that in the event he is incapacitated his brother Mont Jagoda will be his medical decision maker.  - Inter-disciplinary family meet or Palliative Care meeting due by: 02/07/16  Lucious Groves, DO  [facility] PGY-3  Pulmonary and [place] Pager: [phone]  01/31/2016, 11:57 PM

## 2016-01-31 NOTE — Progress Notes (Signed)
ABG done while patient was on bipap

## 2016-01-31 NOTE — ED Notes (Signed)
Pt coming from [facility] center with SOB History of left lung mass. Pneumonia?  2.5 and .5 albuterol and atrovent prior to EMS arrival. O2 satst Non re breather on 10 L 79%.  Trach repair done last week, trach removed. Falt fine today got up and ambulated to the restroom and was SOB and even worse after ambulating back and alerted staff.   Nonrebreather Clear lung sound on R Decreased and diminished on left side. LLL expiratory wheezing.  EMS gave 10 mg albuterol and .5 atrovent and 125 solumedrol. 88% on pulse ox on finger with just the breathing treatments 94-97% on earlobe. Attempted to switch him to a [place] after the breathing treatment with 4L and desat to 80%. Tachypnea. NRB at 15L 90%

## 2016-01-31 NOTE — ED Provider Notes (Addendum)
CSN: [id number]     Arrival date & time 01/31/16  2224 History   First MD Initiated Contact with Patient 01/31/16 2235     Chief Complaint  Patient presents with  . Shortness of Breath      HPI  Patient presents with a chief complaint shortness of breath.  Patient with history of schizophrenia, hypertension, hyperlipidemia, reflux, and type 2 diabetes. Presented today with worsening shortness of breath after recent hospitalization. Admitted 317, through 4-3. Found to have left lung lesion, and epiglottis lesion. Underwent debulking laser laryngoscopy for tissue diagnosis. Has not started on any treatment as yet. Discharged yesterday. Worsening shortness of breath today and he presents here.  He denies fever. Has had a cough. Short of breath. No pain.        Past Medical History  Diagnosis Date  . Schizophrenia ([place])   . Hypertension   . Hyperlipemia   . GERD (gastroesophageal reflux disease)   . Diabetes mellitus without complication [facility])    Past Surgical History  Procedure Laterality Date  . Knee arthroscopy    . Video bronchoscopy Bilateral 01/20/2016    Procedure: VIDEO BRONCHOSCOPY WITHOUT FLUORO;  Surgeon: Rigoberto Noel, MD;  Location: [facility];  Service: Cardiopulmonary;  Laterality: Bilateral;  . Microlaryngoscopy with co2 laser and excision of vocal cord lesion N/A 01/25/2016    Procedure: MICROLARYNGOSCOPY WITH CO2 LASER, DEBULKING OF LARYNX MASS ;  Surgeon: Melida Quitter, MD;  Location: [facility] OR;  Service: ENT;  Laterality: N/A;   Family History  Problem Relation Age of Onset  . CAD Mother   . Lung cancer Father   . CAD Brother   . Diabetes Other   . Stroke Neg Hx    Social History  Substance Use Topics  . Smoking status: Current Every Day Smoker  . Smokeless tobacco: None  . Alcohol Use: Yes    Review of Systems  Constitutional: Positive for activity change and fatigue. Negative for fever, chills, diaphoresis and appetite change.  HENT: Negative for  mouth sores, sore throat and trouble swallowing.   Eyes: Negative for visual disturbance.  Respiratory: Positive for cough and shortness of breath. Negative for chest tightness and wheezing.        Absent left-sided breath sounds. Trachea midline. No JVD.  Cardiovascular: Negative for chest pain.  Gastrointestinal: Negative for nausea, vomiting, abdominal pain, diarrhea and abdominal distention.  Endocrine: Negative for polydipsia, polyphagia and polyuria.  Genitourinary: Negative for dysuria, frequency and hematuria.  Musculoskeletal: Negative for gait problem.  Skin: Negative for color change, pallor and rash.  Neurological: Positive for weakness. Negative for dizziness, syncope, light-headedness and headaches.  Hematological: Does not bruise/bleed easily.  Psychiatric/Behavioral: Negative for behavioral problems and confusion.      Allergies  Review of patient's allergies indicates no known allergies.  Home Medications   Prior to Admission medications   Medication Sig Start Date End Date Taking? Authorizing Provider  albuterol (PROVENTIL HFA;VENTOLIN HFA) 108 (90 Base) MCG/ACT inhaler Inhale 2 puffs into the lungs every 6 (six) hours as needed for wheezing or shortness of breath. 01/28/16   Reyne Dumas, MD  amLODipine (NORVASC) 10 MG tablet Take 10 mg by mouth daily.    Historical Provider, MD  chlorpheniramine-HYDROcodone (TUSSIONEX) 10-8 MG/5ML SUER Take 5 mLs by mouth every 12 (twelve) hours. 01/28/16   Reyne Dumas, MD  cloZAPine (CLOZARIL) 100 MG tablet Take 100 mg by mouth at bedtime.     Historical Provider, MD  divalproex (DEPAKOTE) 500 MG  DR tablet Take 500-1,000 mg by mouth 2 (two) times daily. Takes '500mg'$  in am and '1000mg'$  in pm    Historical Provider, MD  docusate sodium (COLACE) 100 MG capsule Take 1 capsule (100 mg total) by mouth 2 (two) times daily. 01/28/16   Reyne Dumas, MD  feeding supplement, GLUCERNA SHAKE, (GLUCERNA SHAKE) LIQD Take 237 mLs by mouth 2 (two) times  daily between meals. 01/28/16   Reyne Dumas, MD  gemfibrozil (LOPID) 600 MG tablet Take 600 mg by mouth 2 (two) times daily.    Historical Provider, MD  metFORMIN (GLUCOPHAGE) 1000 MG tablet Take 500 mg by mouth 2 (two) times daily with a meal.     Historical Provider, MD  mometasone-formoterol (DULERA) 200-5 MCG/ACT AERO Inhale 2 puffs into the lungs 2 (two) times daily. 01/28/16   Reyne Dumas, MD  neomycin-polymyxin-hydrocortisone (CORTISPORIN) otic solution Place 3 drops into the right ear 4 (four) times daily. 01/09/16   Robyn Haber, MD  nicotine (NICODERM CQ - DOSED IN MG/24 HOURS) 14 mg/24hr patch Place 1 patch (14 mg total) onto the skin daily. 01/28/16   Reyne Dumas, MD  omeprazole (PRILOSEC) 20 MG capsule Take 20 mg by mouth daily.    Historical Provider, MD   Pulse 108  Resp 28  Ht '5\' 10"'$  (1.778 m)  Wt 154 lb (69.854 kg)  BMI 22.10 kg/m2  SpO2 95% Physical Exam  Constitutional: He has a sickly appearance. He appears distressed. Face mask in place.  Respiratory distress. Clutching his mask. 60% saturations.  Eyes:  Conjunctiva not pale  Cardiovascular:  Sinus tachycardia  Pulmonary/Chest: Accessory muscle usage present. He is in respiratory distress. He has decreased breath sounds in the left upper field, the left middle field and the left lower field.  Trachea midline. Not stridorous.    ED Course  Procedures (including critical care time) Labs Review Labs Reviewed  COMPREHENSIVE METABOLIC PANEL - Abnormal; Notable for the following:    Sodium 125 (*)    Chloride 92 (*)    CO2 21 (*)    Glucose, Bld 184 (*)    Calcium 8.4 (*)    Total Protein 5.5 (*)    Albumin 2.8 (*)    ALT 14 (*)    All other components within normal limits  CBC WITH DIFFERENTIAL/PLATELET - Abnormal; Notable for the following:    WBC 23.5 (*)    HCT 38.7 (*)    Neutro Abs 20.9 (*)    Monocytes Absolute 1.8 (*)    All other components within normal limits  PROTIME-INR - Abnormal; Notable for  the following:    Prothrombin Time 16.7 (*)    All other components within normal limits  I-STAT ARTERIAL BLOOD GAS, ED - Abnormal; Notable for the following:    pO2, Arterial 69.0 (*)    Bicarbonate 25.1 (*)    All other components within normal limits  URINE CULTURE  CULTURE, BLOOD (ROUTINE X 2)  CULTURE, BLOOD (ROUTINE X 2)  LIPASE, BLOOD  BRAIN NATRIURETIC PEPTIDE  URINALYSIS, ROUTINE W REFLEX MICROSCOPIC (NOT AT [facility])  VALPROIC ACID LEVEL  BLOOD GAS, ARTERIAL  I-STAT CG4 LACTIC ACID, ED  I-STAT TROPOININ, ED  TYPE AND SCREEN  ABO/RH    Imaging Review Dg Chest Port 1 View  01/31/2016  CLINICAL DATA:  Dyspnea. Discharge from hospital yesterday with recent diagnosis of lung mass. EXAM: PORTABLE CHEST 1 VIEW COMPARISON:  Chest CT 01/18/2016 FINDINGS: Complete opacification of the left hemithorax with volume loss, question of  tiny aerated portion of left upper lobe. There is leftward tracheal deviation. Cardiac in mediastinal contours are obscured. Progressive opacity in the right mid and lower lung zone compared to prior exam, worsening pneumonia, edema, or less likely neoplasm. No evidence of pneumothorax. IMPRESSION: 1. Complete opacification of the left hemithorax with volume loss and tracheal deviation, consistent with multilobar collapse/atelectasis. Patient with known endobronchial lesion and left lung mass. 2. Worsening opacity in the right and mid lower lung zone, asymmetric pulmonary edema, infection, or less likely spread of malignancy. Electronically Signed   By: Jeb Levering M.D.   On: 01/31/2016 23:33   I have personally reviewed and evaluated these images and lab results as part of my medical decision-making.   EKG Interpretation None      MDM   Final diagnoses:  Hypoxemia  Endobronchial cancer, left ([place])    Patient changed from 15 L nonrebreather mask to BiPAP 10/5. Saturations climbed to the 90s. Work of breathing improves. ABG shows pH 7.4, P CO2 of 40. I  discussed the case with intensivist. Leukocytosis  Noted. Normal lactate. Not hypotensive. After cultures was given vancomycin, Zosyn. Plan will be admission admission. Chest x-ray shows complete opacification of left hemithorax. To my read, and after review of the chart showing left mainstem endobronchial lesion likely complete atelectasis left lung. Has improved markedly with positive pressure. Will likely require bronchoscopy. Critical care in route.  CRITICAL CARE Performed by: Tanna Furry JOSEPH   Total critical care time: 30 minutes  Critical care time was exclusive of separately billable procedures and treating other patients.  Critical care was necessary to treat or prevent imminent or life-threatening deterioration.  Critical care was time spent personally by me on the following activities: development of treatment plan with patient and/or surrogate as well as nursing, discussions with consultants, evaluation of patient's response to treatment, examination of patient, obtaining history from patient or surrogate, ordering and performing treatments and interventions, ordering and review of laboratory studies, ordering and review of radiographic studies, pulse oximetry and re-evaluation of patient's condition.     Tanna Furry, MD 01/31/16 [place], MD 02/02/16 [phone]

## 2016-02-01 ENCOUNTER — Inpatient Hospital Stay (HOSPITAL_COMMUNITY): Payer: Medicare Other

## 2016-02-01 ENCOUNTER — Encounter (HOSPITAL_COMMUNITY): Payer: Self-pay

## 2016-02-01 DIAGNOSIS — J9600 Acute respiratory failure, unspecified whether with hypoxia or hypercapnia: Secondary | ICD-10-CM | POA: Diagnosis present

## 2016-02-01 DIAGNOSIS — J9601 Acute respiratory failure with hypoxia: Secondary | ICD-10-CM

## 2016-02-01 DIAGNOSIS — C3492 Malignant neoplasm of unspecified part of left bronchus or lung: Secondary | ICD-10-CM

## 2016-02-01 DIAGNOSIS — L8990 Pressure ulcer of unspecified site, unspecified stage: Secondary | ICD-10-CM | POA: Insufficient documentation

## 2016-02-01 LAB — BASIC METABOLIC PANEL
ANION GAP: 14 (ref 5–15)
Anion gap: 11 (ref 5–15)
BUN: 11 mg/dL (ref 6–20)
BUN: 9 mg/dL (ref 6–20)
CALCIUM: 8.5 mg/dL — AB (ref 8.9–10.3)
CO2: 23 mmol/L (ref 22–32)
CO2: 22 mmol/L (ref 22–32)
Calcium: 8.7 mg/dL — ABNORMAL LOW (ref 8.9–10.3)
Chloride: 95 mmol/L — ABNORMAL LOW (ref 101–111)
Chloride: 95 mmol/L — ABNORMAL LOW (ref 101–111)
Creatinine, Ser: 0.75 mg/dL (ref 0.61–1.24)
Creatinine, Ser: 0.76 mg/dL (ref 0.61–1.24)
GFR calc Af Amer: >60 mL/min (ref >60)
GFR calc Af Amer: >60 mL/min (ref >60)
GLUCOSE: 265 mg/dL — AB (ref 65–99)
GLUCOSE: 261 mg/dL — AB (ref 65–99)
POTASSIUM: 4.6 mmol/L (ref 3.5–5.1)
Potassium: 4.4 mmol/L (ref 3.5–5.1)
Sodium: 129 mmol/L — ABNORMAL LOW (ref 135–145)
Sodium: 131 mmol/L — ABNORMAL LOW (ref 135–145)

## 2016-02-01 LAB — MRSA PCR SCREENING: MRSA by PCR: NEGATIVE

## 2016-02-01 LAB — CBC WITH DIFFERENTIAL/PLATELET
BASOS ABS: 0 10*3/uL (ref 0.0–0.1)
BASOS PCT: 0 %
EOS PCT: 0 %
Eosinophils Absolute: 0 10*3/uL (ref 0.0–0.7)
HEMATOCRIT: 39 % (ref 39.0–52.0)
Hemoglobin: 13.5 g/dL (ref 13.0–17.0)
Lymphocytes Relative: 3 %
Lymphs Abs: 0.7 10*3/uL (ref 0.7–4.0)
MCH: 28.1 pg (ref 26.0–34.0)
MCHC: 34.6 g/dL (ref 30.0–36.0)
MCV: 81.3 fL (ref 78.0–100.0)
MONO ABS: 0.4 10*3/uL (ref 0.1–1.0)
MONOS PCT: 2 %
Neutro Abs: 18.9 10*3/uL — ABNORMAL HIGH (ref 1.7–7.7)
Neutrophils Relative %: 95 %
PLATELETS: 147 10*3/uL — AB (ref 150–400)
RBC: 4.8 MIL/uL (ref 4.22–5.81)
RDW: 14 % (ref 11.5–15.5)
WBC: 20 10*3/uL — ABNORMAL HIGH (ref 4.0–10.5)

## 2016-02-01 LAB — BLOOD GAS, ARTERIAL
ACID-BASE EXCESS: 2.1 mmol/L — AB (ref 0.0–2.0)
Bicarbonate: 27.3 mEq/L — ABNORMAL HIGH (ref 20.0–24.0)
DRAWN BY: 281201
FIO2: 1
MECHVT: 450 mL
O2 Saturation: 91.3 %
PCO2 ART: 51.3 mmHg — AB (ref 35.0–45.0)
PEEP/CPAP: 5 cmH2O
PH ART: 7.345 — AB (ref 7.350–7.450)
PO2 ART: 67.1 mmHg — AB (ref 80.0–100.0)
Patient temperature: 98.6
RATE: 24 resp/min
TCO2: 28.8 mmol/L (ref 0–100)

## 2016-02-01 LAB — GLUCOSE, CAPILLARY
GLUCOSE-CAPILLARY: 231 mg/dL — AB (ref 65–99)
Glucose-Capillary: 258 mg/dL — ABNORMAL HIGH (ref 65–99)
Glucose-Capillary: 186 mg/dL — ABNORMAL HIGH (ref 65–99)
Glucose-Capillary: 163 mg/dL — ABNORMAL HIGH (ref 65–99)
Glucose-Capillary: 180 mg/dL — ABNORMAL HIGH (ref 65–99)

## 2016-02-01 LAB — SODIUM, URINE, RANDOM: SODIUM UR: 21 mmol/L

## 2016-02-01 LAB — URINALYSIS, ROUTINE W REFLEX MICROSCOPIC
Bilirubin Urine: NEGATIVE
Glucose, UA: 100 mg/dL — AB
HGB URINE DIPSTICK: NEGATIVE
Ketones, ur: 15 mg/dL — AB
Leukocytes, UA: NEGATIVE
Nitrite: NEGATIVE
Protein, ur: NEGATIVE mg/dL
SPECIFIC GRAVITY, URINE: 1.013 (ref 1.005–1.030)
pH: 7 (ref 5.0–8.0)

## 2016-02-01 LAB — OSMOLALITY, URINE: OSMOLALITY UR: 398 mosm/kg (ref 300–900)

## 2016-02-01 LAB — TYPE AND SCREEN
ABO/RH(D): O POS
ANTIBODY SCREEN: NEGATIVE

## 2016-02-01 LAB — VALPROIC ACID LEVEL: VALPROIC ACID LVL: 38 ug/mL — AB (ref 50.0–100.0)

## 2016-02-01 LAB — OSMOLALITY: OSMOLALITY: 280 mosm/kg (ref 275–295)

## 2016-02-01 LAB — CBG MONITORING, ED: Glucose-Capillary: 273 mg/dL — ABNORMAL HIGH (ref 65–99)

## 2016-02-01 LAB — CREATININE, URINE, RANDOM: CREATININE, URINE: 69.75 mg/dL

## 2016-02-01 LAB — TRIGLYCERIDES: TRIGLYCERIDES: 86 mg/dL (ref <150)

## 2016-02-01 MED ORDER — ASPIRIN 81 MG PO CHEW
324.0000 mg | CHEWABLE_TABLET | ORAL | Status: AC
  Administered 2016-02-01: 324 mg via ORAL
  Filled 2016-02-01: qty 4

## 2016-02-01 MED ORDER — PNEUMOCOCCAL VAC POLYVALENT 25 MCG/0.5ML IJ INJ
0.5000 mL | INJECTION | INTRAMUSCULAR | Status: AC
  Administered 2016-02-02: 0.5 mL via INTRAMUSCULAR
  Filled 2016-02-01: qty 0.5

## 2016-02-01 MED ORDER — FENTANYL BOLUS VIA INFUSION
50.0000 ug | INTRAVENOUS | Status: DC | PRN
  Filled 2016-02-01: qty 50

## 2016-02-01 MED ORDER — MIDAZOLAM HCL 2 MG/2ML IJ SOLN
INTRAMUSCULAR | Status: AC
  Administered 2016-02-01: 4 mg via INTRAVENOUS
  Filled 2016-02-01: qty 2

## 2016-02-01 MED ORDER — CETYLPYRIDINIUM CHLORIDE 0.05 % MT LIQD
7.0000 mL | Freq: Two times a day (BID) | OROMUCOSAL | Status: DC
  Administered 2016-02-01 – 2016-02-06 (×6): 7 mL via OROMUCOSAL

## 2016-02-01 MED ORDER — HEPARIN SODIUM (PORCINE) 5000 UNIT/ML IJ SOLN
5000.0000 [IU] | Freq: Three times a day (TID) | INTRAMUSCULAR | Status: DC
  Administered 2016-02-01 (×3): 5000 [IU] via SUBCUTANEOUS
  Filled 2016-02-01 (×3): qty 1

## 2016-02-01 MED ORDER — ETOMIDATE 2 MG/ML IV SOLN
20.0000 mg | Freq: Once | INTRAVENOUS | Status: AC
  Administered 2016-02-01: 20 mg via INTRAVENOUS

## 2016-02-01 MED ORDER — FENTANYL CITRATE (PF) 100 MCG/2ML IJ SOLN
100.0000 ug | Freq: Once | INTRAMUSCULAR | Status: AC
  Administered 2016-02-01: 100 ug via INTRAVENOUS

## 2016-02-01 MED ORDER — ACETYLCYSTEINE 20 % IN SOLN
3.0000 mL | Freq: Three times a day (TID) | RESPIRATORY_TRACT | Status: DC
  Administered 2016-02-01: 3 mL via RESPIRATORY_TRACT
  Filled 2016-02-01: qty 4

## 2016-02-01 MED ORDER — MIDAZOLAM HCL 2 MG/2ML IJ SOLN
2.0000 mg | INTRAMUSCULAR | Status: DC | PRN
Start: 2016-02-01 — End: 2016-02-02
  Administered 2016-02-01: 2 mg via INTRAVENOUS
  Filled 2016-02-01: qty 2

## 2016-02-01 MED ORDER — FENTANYL CITRATE (PF) 100 MCG/2ML IJ SOLN
INTRAMUSCULAR | Status: AC
  Administered 2016-02-01: 100 ug via INTRAVENOUS
  Filled 2016-02-01: qty 2

## 2016-02-01 MED ORDER — SODIUM CHLORIDE 0.9 % IV SOLN: 250.0000 mL | INTRAVENOUS | Status: DC | PRN

## 2016-02-01 MED ORDER — INSULIN ASPART 100 UNIT/ML ~~LOC~~ SOLN: 2.0000 [IU] | SUBCUTANEOUS | Status: DC

## 2016-02-01 MED ORDER — PIPERACILLIN-TAZOBACTAM 3.375 G IVPB
3.3750 g | Freq: Three times a day (TID) | INTRAVENOUS | Status: DC
  Administered 2016-02-01 – 2016-02-06 (×16): 3.375 g via INTRAVENOUS
  Filled 2016-02-01 (×18): qty 50

## 2016-02-01 MED ORDER — ASPIRIN 300 MG RE SUPP: 300.0000 mg | RECTAL | Status: AC

## 2016-02-01 MED ORDER — DEXAMETHASONE SODIUM PHOSPHATE 4 MG/ML IJ SOLN
4.0000 mg | Freq: Four times a day (QID) | INTRAMUSCULAR | Status: DC
  Administered 2016-02-01 – 2016-02-02 (×6): 4 mg via INTRAVENOUS
  Filled 2016-02-01 (×6): qty 1

## 2016-02-01 MED ORDER — CHLORHEXIDINE GLUCONATE 0.12 % MT SOLN
15.0000 mL | Freq: Two times a day (BID) | OROMUCOSAL | Status: DC
  Administered 2016-02-01: 15 mL via OROMUCOSAL
  Filled 2016-02-01: qty 15

## 2016-02-01 MED ORDER — MIDAZOLAM HCL 2 MG/2ML IJ SOLN
INTRAMUSCULAR | Status: AC
  Filled 2016-02-01: qty 2

## 2016-02-01 MED ORDER — MIDAZOLAM HCL 2 MG/2ML IJ SOLN
4.0000 mg | Freq: Once | INTRAMUSCULAR | Status: AC
  Administered 2016-02-01: 4 mg via INTRAVENOUS

## 2016-02-01 MED ORDER — ALBUTEROL SULFATE (2.5 MG/3ML) 0.083% IN NEBU
2.5000 mg | INHALATION_SOLUTION | Freq: Three times a day (TID) | RESPIRATORY_TRACT | Status: DC
  Administered 2016-02-01 – 2016-02-07 (×20): 2.5 mg via RESPIRATORY_TRACT
  Filled 2016-02-01 (×20): qty 3

## 2016-02-01 MED ORDER — CHLORHEXIDINE GLUCONATE 0.12% ORAL RINSE (MEDLINE KIT)
15.0000 mL | Freq: Two times a day (BID) | OROMUCOSAL | Status: DC
  Administered 2016-02-01 – 2016-02-02 (×4): 15 mL via OROMUCOSAL

## 2016-02-01 MED ORDER — SODIUM CHLORIDE 0.9 % IV SOLN
250.0000 mL | INTRAVENOUS | Status: DC
  Administered 2016-02-06: 250 mL via INTRAVENOUS

## 2016-02-01 MED ORDER — INSULIN ASPART 100 UNIT/ML ~~LOC~~ SOLN
0.0000 [IU] | SUBCUTANEOUS | Status: DC
  Administered 2016-02-01: 5 [IU] via SUBCUTANEOUS
  Administered 2016-02-01 (×2): 3 [IU] via SUBCUTANEOUS
  Administered 2016-02-01: 2 [IU] via SUBCUTANEOUS
  Administered 2016-02-01: 3 [IU] via SUBCUTANEOUS
  Administered 2016-02-01: 8 [IU] via SUBCUTANEOUS
  Administered 2016-02-02: 2 [IU] via SUBCUTANEOUS
  Administered 2016-02-02: 3 [IU] via SUBCUTANEOUS
  Filled 2016-02-01: qty 1

## 2016-02-01 MED ORDER — PROPOFOL 1000 MG/100ML IV EMUL
0.0000 ug/kg/min | INTRAVENOUS | Status: DC
  Administered 2016-02-01: 5 ug/kg/min via INTRAVENOUS
  Filled 2016-02-01: qty 100

## 2016-02-01 MED ORDER — SODIUM CHLORIDE 0.9 % IV SOLN
25.0000 ug/h | INTRAVENOUS | Status: DC
  Administered 2016-02-01: 100 ug/h via INTRAVENOUS
  Filled 2016-02-01: qty 50

## 2016-02-01 MED ORDER — PANTOPRAZOLE SODIUM 40 MG IV SOLR
40.0000 mg | INTRAVENOUS | Status: DC
  Administered 2016-02-01 – 2016-02-02 (×2): 40 mg via INTRAVENOUS
  Filled 2016-02-01 (×2): qty 40

## 2016-02-01 MED ORDER — ANTISEPTIC ORAL RINSE SOLUTION (CORINZ)
7.0000 mL | OROMUCOSAL | Status: DC
  Administered 2016-02-01 – 2016-02-02 (×11): 7 mL via OROMUCOSAL

## 2016-02-01 NOTE — Progress Notes (Signed)
Patient self extubated, patient on non rebreather sating 100%, HR 85, RR 18, BP 114/74. Elink button pushed, Dr. Elsworth Soho camera in, patient states he is feeling great. Will continue to monitor closely.

## 2016-02-01 NOTE — Procedures (Signed)
Bedside Bronchoscopy Procedure Note AAHIL FREDIN [id number] [birth year]  Procedure: Bronchoscopy Indications: Obtain specimens for culture and/or other diagnostic studies  Procedure Details: ET Tube Size: 8.0 ET Tube secured at lip (cm): 24 Bite block in place: No In preparation for procedure, Patient hyper-oxygenated with 100 % FiO2 Airway entered and the following bronchi were examined: LUL, LLL and Bronchi.   Bronchoscope removed.  Patient remained on 100% FiO2 throughout the procedure.    Evaluation BP 114/78 mmHg  Pulse 99  Temp(Src) 97.4 F (36.3 C) (Axillary)  Resp 23  Ht '5\' 10"'$  (1.778 m)  Wt 153 lb 7 oz (69.6 kg)  BMI 22.02 kg/m2  SpO2 94% Breath Sounds:Clear O2 sats: stable throughout Patient's Current Condition: stable Specimens:  biopsies Complications: No apparent complications Patient did tolerate procedure well.   Kathie Dike 02/01/2016, 2:59 PM

## 2016-02-01 NOTE — Progress Notes (Signed)
PULMONARY / CRITICAL CARE MEDICINE   Name: Chad Wang MRN: [id number] DOB: 12-07-1955    ADMISSION DATE:  01/31/2016  REFERRING MD:  ER  CHIEF COMPLAINT:  Short of breath  SUBJECTIVE:  Feels BiPAP is blowing too much and drying him out.  VITAL SIGNS: BP 121/78 mmHg  Pulse 83  Temp(Src) 98.1 F (36.7 C) (Oral)  Resp 22  Ht '5\' 10"'$  (1.778 m)  Wt 153 lb 7 oz (69.6 kg)  BMI 22.02 kg/m2  SpO2 92%  VENTILATOR SETTINGS: Vent Mode:  [-] BIPAP;PCV FiO2 (%):  [70 %-100 %] 70 % Set Rate:  [10 bmp] 10 bmp PEEP:  [5 cmH20-8 cmH20] 8 cmH20 Pressure Support:  [5 cmH20] 5 cmH20  INTAKE / OUTPUT: I/O last 3 completed shifts: In: 250 [IV Piggyback:250] Out: 325 [Urine:325]  PHYSICAL EXAMINATION: General:  alert Neuro:  Normal strength HEENT:  BiPAP mask on Cardiovascular:  Regular, no murmur Lungs:  Faint crackles on Rt, no air movement on Lt Abdomen:  Soft, non tender Musculoskeletal:  No edema Skin:  No rashes  LABS:  BMET  Recent Labs Lab 01/31/16 2242 02/01/16 0228 02/01/16 0306  NA 125* 129* 131*  K 5.1 4.4 4.6  CL 92* 95* 95*  CO2 21* 23 22  BUN '12 11 9  '$ CREATININE 0.63 0.75 0.76  GLUCOSE 184* 265* 261*    Electrolytes  Recent Labs Lab 01/31/16 2242 02/01/16 0228 02/01/16 0306  CALCIUM 8.4* 8.5* 8.7*    CBC  Recent Labs Lab 01/30/16 0914 01/31/16 2242 02/01/16 0306  WBC 11.9* 23.5* 20.0*  HGB 13.8 13.4 13.5  HCT 39.6 38.7* 39.0  PLT 153 160 147*    Coag's  Recent Labs Lab 01/31/16 2242  INR 1.34    Sepsis Markers  Recent Labs Lab 01/31/16 2300  LATICACIDVEN 1.33    ABG  Recent Labs Lab 01/31/16 2341  PHART 7.405  PCO2ART 40.0  PO2ART 69.0*    Liver Enzymes  Recent Labs Lab 01/28/16 0500 01/30/16 0914 01/31/16 2242  AST 12* 13* 39  ALT 13* 9* 14*  ALKPHOS 68 55 66  BILITOT 0.6 0.6 1.2  ALBUMIN 3.2* 2.5* 2.8*    Cardiac Enzymes No results for input(s): TROPONINI, PROBNP in the last 168  hours.  Glucose  Recent Labs Lab 01/29/16 1712 01/29/16 2118 01/30/16 0742 01/30/16 1117 02/01/16 0212 02/01/16 0427  GLUCAP 181* 170* 120* 144* 273* 258*    Imaging Dg Chest Port 1 View  01/31/2016  CLINICAL DATA:  Dyspnea. Discharge from hospital yesterday with recent diagnosis of lung mass. EXAM: PORTABLE CHEST 1 VIEW COMPARISON:  Chest CT 01/18/2016 FINDINGS: Complete opacification of the left hemithorax with volume loss, question of tiny aerated portion of left upper lobe. There is leftward tracheal deviation. Cardiac in mediastinal contours are obscured. Progressive opacity in the right mid and lower lung zone compared to prior exam, worsening pneumonia, edema, or less likely neoplasm. No evidence of pneumothorax. IMPRESSION: 1. Complete opacification of the left hemithorax with volume loss and tracheal deviation, consistent with multilobar collapse/atelectasis. Patient with known endobronchial lesion and left lung mass. 2. Worsening opacity in the right and mid lower lung zone, asymmetric pulmonary edema, infection, or less likely spread of malignancy. Electronically Signed   By: Jeb Levering M.D.   On: 01/31/2016 23:33     STUDIES:  3/24 Bronchoscopy >> supraglottic mass, Lt mainstem bronchus mass 3/30 Laryngoscopy with debulking of laryngeal mass >> Squamous cell carcinoma  CULTURES: 4/05 Blood >>  4/05 Urine >>   ANTIBIOTICS: 4/04 Vancomycin >> 4/04 Zosyn >>   SIGNIFICANT EVENTS: 3/22 - 4/03 Admit for LLL mass with post-obstructive PNA, hyponatremia 4/04 Re-admit with progressive dyspnea  LINES/TUBES:   DISCUSSION: 60 yo male smoker re-admitted with progressive dyspnea 2nd to acute hypoxic respiratory failure from obstruction of left mainstem bronchus.  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  He has hx of COPD/emphysema, schizophrenia, HTN, HLD, GERD, DM  ASSESSMENT / PLAN:  PULMONARY A: Acute hypoxic respiratory failure  2nd to Lt main stem obstruction with complete ATX of Lt lung and post-obstructive pneumonia. Tobacco abuse. COPD with emphysema. P:   Change to high flow oxygen >> intolerant of BiPAP Continue albuterol, mucomyst F/u CXR >> if no improvement, then intubate and bronch for airway inspection  CARDIOVASCULAR A:  Hx of HTN, HLD. P:  Hold outpt norvasc, lopid  RENAL A:   Hyponatremia. P:   NS IV fluid  GASTROINTESTINAL A:   Nutrition. Hx of GERD. P:   NPO until respiratory status more stable Protonix  HEMATOLOGIC A:   Squamous cell carcinoma of larynx. Lt lung mass ?if 2nd primary. P:  Will need evaluation by oncology and radiation oncology Not sure what role decadron is planning in reducing mass obstruction >> continue for now, but likely d/c soon SQ heparin for DVT prophylaxis  INFECTIOUS A:   Post-obstructive pneumonia. P:   Day 2 vancomycin, zosyn  ENDOCRINE A:   DM with steroid induced hyperglycemia. P:   SSI Hold outpt glucophage  NEUROLOGIC A:   Hx of schizophrenia. P:   Hold outpt clozaril, depakote  Updated pt's family at bedside.  CC time 48 minutes.  [name], MD [facility] Pulmonary/Critical Care 02/01/2016, 8:55 AM Pager:  [phone] After 3pm call: [phone]

## 2016-02-01 NOTE — Procedures (Signed)
Intubation Procedure Note AC COLAN [id number] Jun 02, 1956  Procedure: Intubation Indications: Respiratory insufficiency  Procedure Details Consent: Risks of procedure as well as the alternatives and risks of each were explained to the (patient/caregiver).  Consent for procedure obtained. Time Out: Verified patient identification, verified procedure, site/side was marked, verified correct patient position, special equipment/implants available, medications/allergies/relevent history reviewed, required imaging and test results available.  Performed  Maximum sterile technique was used including gloves and hand hygiene.  MAC and 3  Used glidescope.  Healing lesion from prior laser therapy of laryngeal mass.  Good visualization of vocal cords.  Given 4 mg versed, 100 mcg fentanyl, 20 mg etomidate.  Inserted #8 ETT to 23 cm at lip.  Evaluation Hemodynamic Status: BP stable throughout; O2 sats: stable throughout Patient's Current Condition: stable Complications: No apparent complications Patient did tolerate procedure well. Chest X-ray ordered to verify placement.  CXR: pending.  Will use lower Vt due to obstruction of Lt lung, and increase RR to maintain Ve.  [name], MD [facility] Pulmonary/Critical Care 02/01/2016, 12:03 PM Pager:  [phone] After 3pm call: [phone]

## 2016-02-01 NOTE — ED Notes (Signed)
Pt still unable to void

## 2016-02-01 NOTE — Progress Notes (Signed)
Patient blood cultures positive for gram positive cocci and clusters. Informing MD at this time and will continue to monitor patient. Gildardo Cranker, RN

## 2016-02-01 NOTE — Progress Notes (Signed)
RT Note: Pt self-extubated, tolerating on NRB at this time, VS WNL, HHN being given. MD aware, RT will continue to monitor.

## 2016-02-01 NOTE — Procedures (Signed)
Bronchoscopy Procedure Note Chad Wang [id number] Aug 31, 1956  Procedure: Bronchoscopy Indications: 60 yo male with endobronchial mass, acute hypoxic respiratory failure, and complete atelectasis of left lung.  Procedure Details Consent: Risks of procedure as well as the alternatives and risks of each were explained to the (patient/caregiver).  Consent for procedure obtained. Time Out: Verified patient identification, verified procedure, site/side was marked, verified correct patient position, special equipment/implants available, medications/allergies/relevent history reviewed, required imaging and test results available.  Performed  Given propofol, fentanyl for conscious sedation and analgesia.  Total time was 24 minutes.  Procedure done in ICU.  Patient intubated earlier in the day.  Placed on 100% FiO2.  Bronchoscope entered through ETT and carina visualized.  Bronchoscope then entered into the right main bronchus.  The right upper, middle, and lower lobe orifices visualized.  No endobronchial lesions.  Bronchoscope then entered into left main bronchus.  There was white/pink colored mass obstructing left main bronchus.  This appeared to be eminating from the left upper lobe.  I was unable to pass bronchoscope beyond this mass.  Using biopsy forceps, directly visualized biopsy from endobronchial mass x 2 attempts.  There was minimal bleeding after second biopsy, that resolved after instilling saline.  No other immediate complications.   Disposition Will send endobronchial biopsy for surgical pathology  Updated patient's family about bronchoscopy findings after procedure.   [name], MD [facility] Pulmonary/Critical Care 02/01/2016, 3:37 PM Pager:  [phone] After 3pm call: [phone]

## 2016-02-02 ENCOUNTER — Ambulatory Visit
Admit: 2016-02-02 | Discharge: 2016-02-02 | Disposition: A | Discharge status: Home / Self Care | Payer: Medicare Other | Attending: Radiation Oncology | Admitting: Radiation Oncology

## 2016-02-02 ENCOUNTER — Inpatient Hospital Stay (HOSPITAL_COMMUNITY): Payer: Medicare Other

## 2016-02-02 DIAGNOSIS — R918 Other nonspecific abnormal finding of lung field: Secondary | ICD-10-CM

## 2016-02-02 DIAGNOSIS — I10 Essential (primary) hypertension: Secondary | ICD-10-CM

## 2016-02-02 DIAGNOSIS — E119 Type 2 diabetes mellitus without complications: Secondary | ICD-10-CM

## 2016-02-02 DIAGNOSIS — Z720 Tobacco use: Secondary | ICD-10-CM

## 2016-02-02 DIAGNOSIS — C3402 Malignant neoplasm of left main bronchus: Secondary | ICD-10-CM | POA: Diagnosis not present

## 2016-02-02 DIAGNOSIS — C329 Malignant neoplasm of larynx, unspecified: Secondary | ICD-10-CM

## 2016-02-02 DIAGNOSIS — C801 Malignant (primary) neoplasm, unspecified: Secondary | ICD-10-CM

## 2016-02-02 DIAGNOSIS — C3492 Malignant neoplasm of unspecified part of left bronchus or lung: Secondary | ICD-10-CM

## 2016-02-02 DIAGNOSIS — F17200 Nicotine dependence, unspecified, uncomplicated: Secondary | ICD-10-CM | POA: Insufficient documentation

## 2016-02-02 DIAGNOSIS — C7802 Secondary malignant neoplasm of left lung: Secondary | ICD-10-CM

## 2016-02-02 DIAGNOSIS — Z510 Encounter for antineoplastic radiation therapy: Secondary | ICD-10-CM | POA: Insufficient documentation

## 2016-02-02 DIAGNOSIS — J387 Other diseases of larynx: Secondary | ICD-10-CM

## 2016-02-02 DIAGNOSIS — C3412 Malignant neoplasm of upper lobe, left bronchus or lung: Secondary | ICD-10-CM | POA: Insufficient documentation

## 2016-02-02 DIAGNOSIS — R0602 Shortness of breath: Secondary | ICD-10-CM

## 2016-02-02 LAB — CBC
HCT: 38 % — ABNORMAL LOW (ref 39.0–52.0)
Hemoglobin: 13.2 g/dL (ref 13.0–17.0)
MCH: 28.5 pg (ref 26.0–34.0)
MCHC: 34.7 g/dL (ref 30.0–36.0)
MCV: 82.1 fL (ref 78.0–100.0)
PLATELETS: 198 10*3/uL (ref 150–400)
RBC: 4.63 MIL/uL (ref 4.22–5.81)
RDW: 14.5 % (ref 11.5–15.5)
WBC: 23.8 10*3/uL — ABNORMAL HIGH (ref 4.0–10.5)

## 2016-02-02 LAB — BASIC METABOLIC PANEL
Anion gap: 11 (ref 5–15)
BUN: 17 mg/dL (ref 6–20)
CHLORIDE: 101 mmol/L (ref 101–111)
CO2: 26 mmol/L (ref 22–32)
CREATININE: 0.61 mg/dL (ref 0.61–1.24)
Calcium: 9.1 mg/dL (ref 8.9–10.3)
GFR calc non Af Amer: >60 mL/min (ref >60)
Glucose, Bld: 162 mg/dL — ABNORMAL HIGH (ref 65–99)
Potassium: 4.2 mmol/L (ref 3.5–5.1)
Sodium: 138 mmol/L (ref 135–145)

## 2016-02-02 LAB — GLUCOSE, CAPILLARY
GLUCOSE-CAPILLARY: 157 mg/dL — AB (ref 65–99)
Glucose-Capillary: 113 mg/dL — ABNORMAL HIGH (ref 65–99)
Glucose-Capillary: 144 mg/dL — ABNORMAL HIGH (ref 65–99)
Glucose-Capillary: 150 mg/dL — ABNORMAL HIGH (ref 65–99)
Glucose-Capillary: 167 mg/dL — ABNORMAL HIGH (ref 65–99)
Glucose-Capillary: 237 mg/dL — ABNORMAL HIGH (ref 65–99)

## 2016-02-02 LAB — URINE CULTURE: Culture: 2000 — AB

## 2016-02-02 MED ORDER — CLOZAPINE 100 MG PO TABS
100.0000 mg | ORAL_TABLET | Freq: Every day | ORAL | Status: DC
  Administered 2016-02-02 – 2016-02-06 (×5): 100 mg via ORAL
  Filled 2016-02-02 (×6): qty 1

## 2016-02-02 MED ORDER — PANTOPRAZOLE SODIUM 40 MG PO TBEC
40.0000 mg | DELAYED_RELEASE_TABLET | Freq: Every day | ORAL | Status: DC
  Administered 2016-02-03 – 2016-02-07 (×5): 40 mg via ORAL
  Filled 2016-02-02 (×5): qty 1

## 2016-02-02 MED ORDER — INSULIN ASPART 100 UNIT/ML ~~LOC~~ SOLN
0.0000 [IU] | Freq: Three times a day (TID) | SUBCUTANEOUS | Status: DC
  Administered 2016-02-02: 5 [IU] via SUBCUTANEOUS
  Administered 2016-02-02 – 2016-02-04 (×4): 3 [IU] via SUBCUTANEOUS
  Administered 2016-02-04: 1 [IU] via SUBCUTANEOUS
  Administered 2016-02-04 – 2016-02-05 (×2): 3 [IU] via SUBCUTANEOUS
  Administered 2016-02-05 – 2016-02-06 (×3): 2 [IU] via SUBCUTANEOUS
  Administered 2016-02-06: 3 [IU] via SUBCUTANEOUS
  Administered 2016-02-07: 2 [IU] via SUBCUTANEOUS

## 2016-02-02 MED ORDER — ENOXAPARIN SODIUM 40 MG/0.4ML ~~LOC~~ SOLN
40.0000 mg | SUBCUTANEOUS | Status: DC
  Administered 2016-02-02 – 2016-02-03 (×2): 40 mg via SUBCUTANEOUS
  Filled 2016-02-02 (×2): qty 0.4

## 2016-02-02 MED ORDER — DIVALPROEX SODIUM 500 MG PO DR TAB: 500.0000 mg | DELAYED_RELEASE_TABLET | Freq: Two times a day (BID) | ORAL | Status: DC

## 2016-02-02 MED ORDER — INSULIN ASPART 100 UNIT/ML ~~LOC~~ SOLN: 0.0000 [IU] | Freq: Every day | SUBCUTANEOUS | Status: DC

## 2016-02-02 MED ORDER — DIVALPROEX SODIUM 500 MG PO DR TAB
500.0000 mg | DELAYED_RELEASE_TABLET | Freq: Every morning | ORAL | Status: DC
  Administered 2016-02-02 – 2016-02-07 (×6): 500 mg via ORAL
  Filled 2016-02-02 (×6): qty 1

## 2016-02-02 MED ORDER — DIVALPROEX SODIUM 500 MG PO DR TAB
1000.0000 mg | DELAYED_RELEASE_TABLET | Freq: Every day | ORAL | Status: DC
  Administered 2016-02-02 – 2016-02-06 (×5): 1000 mg via ORAL
  Filled 2016-02-02 (×6): qty 2

## 2016-02-02 NOTE — Progress Notes (Signed)
Pt to transfer to  for onc follow up.

## 2016-02-02 NOTE — Progress Notes (Signed)
PULMONARY / CRITICAL CARE MEDICINE   Name: Chad Wang MRN: [id number] DOB: [birth year]    ADMISSION DATE:  01/31/2016  REFERRING MD:  ED physician  CHIEF COMPLAINT:  Shortness of breath  SUBJECTIVE:  Breathing better.  Denies throat or chest pain.  Reports small amount of hemoptysis since bronch yesterday.    No Known Allergies  No current facility-administered medications on file prior to encounter.   Current Outpatient Prescriptions on File Prior to Encounter  Medication Sig  . albuterol (PROVENTIL HFA;VENTOLIN HFA) 108 (90 Base) MCG/ACT inhaler Inhale 2 puffs into the lungs every 6 (six) hours as needed for wheezing or shortness of breath.  Marland Kitchen amLODipine (NORVASC) 10 MG tablet Take 10 mg by mouth daily.  . chlorpheniramine-HYDROcodone (TUSSIONEX) 10-8 MG/5ML SUER Take 5 mLs by mouth every 12 (twelve) hours.  . cloZAPine (CLOZARIL) 100 MG tablet Take 100 mg by mouth at bedtime.   . divalproex (DEPAKOTE) 500 MG DR tablet Take 500-1,000 mg by mouth 2 (two) times daily. Takes 554m in am and 10097min pm  . docusate sodium (COLACE) 100 MG capsule Take 1 capsule (100 mg total) by mouth 2 (two) times daily.  . feeding supplement, GLUCERNA SHAKE, (GLUCERNA SHAKE) LIQD Take 237 mLs by mouth 2 (two) times daily between meals.  . Marland Kitchenemfibrozil (LOPID) 600 MG tablet Take 600 mg by mouth 2 (two) times daily.  . metFORMIN (GLUCOPHAGE) 1000 MG tablet Take 500 mg by mouth 2 (two) times daily with a meal.   . mometasone-formoterol (DULERA) 200-5 MCG/ACT AERO Inhale 2 puffs into the lungs 2 (two) times daily.  . Marland Kitcheneomycin-polymyxin-hydrocortisone (CORTISPORIN) otic solution Place 3 drops into the right ear 4 (four) times daily.  . nicotine (NICODERM CQ - DOSED IN MG/24 HOURS) 14 mg/24hr patch Place 1 patch (14 mg total) onto the skin daily.  . Marland Kitchenmeprazole (PRILOSEC) 20 MG capsule Take 20 mg by mouth daily.     VITAL SIGNS: BP 119/88 mmHg  Pulse 79  Temp(Src) 98.3 F (36.8 C) (Oral)  Resp 17   Ht _0  (1.778 m)  Wt 154 lb 5.2 oz (70 kg)  BMI 22.14 kg/m2  SpO2 96%  HEMODYNAMICS:    VENTILATOR SETTINGS: Vent Mode:  [-] PRVC FiO2 (%):  [40 %-100 %] 100 % Set Rate:  [24 bmp] 24 bmp Vt Set:  [450 mL] 450 mL PEEP:  [5 cmH20] 5 cmH20  INTAKE / OUTPUT: I/O last 3 completed shifts: In: 1946.2 [I.V.:1146.2; IV Piggyback:800] Out: 2595 [Urine:2560; Emesis/NG output:35]  PHYSICAL EXAMINATION: General: resting in bed in NAD, watching tv HEENT: Alpha/AT Cardiac: RRR, +2+3/4ystolic murmur Pulm: right lung clear, absent BS left lung Abd: soft, nontender, nondistended, BS present Ext: SCDs in place, warm and well perfused, no pedal edema Neuro: alert and oriented, responding appropriately, following commands  LABS:  BMET  Recent Labs Lab 02/01/16 0228 02/01/16 0306 02/02/16 0349  NA 129* 131* 138  K 4.4 4.6 4.2  CL 95* 95* 101  CO2 _1 BUN _2 CREATININE 0.75 0.76 0.61  GLUCOSE 265* 261* 162*    Electrolytes  Recent Labs Lab 02/01/16 0228 02/01/16 0306 02/02/16 0349  CALCIUM 8.5* 8.7* 9.1    CBC  Recent Labs Lab 01/31/16 2242 02/01/16 0306 02/02/16 0349  WBC 23.5* 20.0* 23.8*  HGB 13.4 13.5 13.2  HCT 38.7* 39.0 38.0*  PLT 160 147* 198   Glucose  Recent Labs Lab 02/01/16 1200 02/01/16 1515 02/01/16 2012 02/01/16 2345 02/02/16  0402 02/02/16 0719  GLUCAP 186* 163* 180* 144* 150* 167*    Imaging Dg Chest Port 1 View  02/02/2016  CLINICAL DATA:  Respiratory failure.  Shortness breath. EXAM: PORTABLE CHEST 1 VIEW COMPARISON:  02/01/2016. FINDINGS: Interim extubation removal of NG tube. Opacification of the left hemithorax again noted.Persistent infiltrate right lower lobe. Heart size cannot be evaluated. Pulmonary vascularity and the right is normal. No pneumothorax. IMPRESSION: 1. Interim removal of endotracheal tube and NG tube. 2. Persistent opacification of the left hemi thorax. Persistent right lower lobe infiltrate. No interim  change. Electronically Signed   By: Marcello Moores  Register   On: 02/02/2016 07:06   STUDIES:  3/24 Bronchoscopy >> supraglottic mass, Lt mainstem bronchus mass 3/30 Laryngoscopy with debulking of laryngeal mass >> Squamous cell carcinoma 04/05 bronchoscopy w/biopsy of mass obstructing left main bronchus 04/06 bronch biopsy c/w SCC  CULTURES: 4/05 Blood >>  4/05 Urine >>   ANTIBIOTICS: 4/04 Vancomycin >> 4/04 Zosyn >>   SIGNIFICANT EVENTS: 03/22 - 04/03 Admit for LLL mass with post-obstructive PNA, hyponatremia 04/04 Re-admit with progressive dyspnea 04/06 endobronchial biopsy - SCC, transfer to [facility] and remain on PCCM service  LINES/TUBES: 04/05 - 04/06 ETT - self extubated 04/04 peripheral IV  ASSESSMENT / PLAN:  PULMONARY A: Acute hypoxic respiratory failure 2/2 left lung obstruction.  Self-extubated and maintaining SpO2 > 90 on 6L. COPD Tobacco abuse P:   Continue supplemental oxygen.  Patient agrees to BiPAP and/or intubation if deterioration. Continue prn albuterol. Tobacco cessation education.  CARDIOVASCULAR A:  Hx of HTN - currently low to normotensive. P:  Hold anti-HTN medications.  RENAL A:   Hyponatremia - likely related to lung CA.  Resolved. P:    GASTROINTESTINAL A:   Nutrition Hx of GERD P:   Ok to resume diet now that he is extubated and stable. 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 P:  Resume [place] heparin for VTE ppx now that hemoptysis improving. Continue SCDs. Consult oncology and rad onc - recommendations and interventions appreciated. Transfer to [facility] on [address].  INFECTIOUS A:   Post-obstructive PNA Blood cx growing gram + cocci in clusters in 1/2 bottles. P:   Continue vanc and zoysn - day 3; follow-up final culture and sensitivity.  ENDOCRINE A:   DM 2 P:   Continue monitoring CBGs and  SSI - change to ac/hs once he resumes diet. D/c steroid.  NEUROLOGIC A:   Hx of schizophrenia - stable. P:   Resume home medications - clozaril and depakote  FAMILY  Updated at bedside on 04/05.   Pulmonary and [place] Pager: [phone]  02/02/2016, 8:49 AM

## 2016-02-02 NOTE — Progress Notes (Signed)
200cc of 250cc bag of fentanyl wasted in sink by two RNs, Lexi P and Jetty Duhamel.

## 2016-02-02 NOTE — Progress Notes (Signed)
Report called to [facility] for room 1230. Patient family called. Patient will be transported via [facility].

## 2016-02-02 NOTE — Consult Note (Signed)
Reason for Referral: Squamous cell carcinoma.   HPI: 60 year old gentleman currently of [place] where he lives independently. He has a history of schizophrenia that is reasonably controlled as well as hypertension and diabetes. He presented on 01/18/2016 with progressive symptoms of shortness of breath and difficulty breathing that has been gradual the last few months. His evaluation at that time including a CT angiogram showed collapse of the left lower lobe of the lung suspicious for endobronchial lesion with a mass like consolidation in the left upper lobe of lung. He underwent a bronchoscopy on 01/20/2016 and showed a polypoid mass found of the glottis is also suspicious for malignancy. He was evaluated by Dr. Redmond Baseman from ENT and underwent a microlaryngoscopy and a biopsy that was done on 01/25/2016. 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 This is the mass that was located at the surface of the epiglottis. 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 Patient was discharged to briefly but was hospitalized again on 01/31/2016 because of respiratory distress. He underwent a bronchoscopy and a biopsy of the endobronchial mass obstructing the left main bronchus. The pathology from this biopsy as well showed squamous cell carcinoma. I was asked to comment about these findings. He is a CT scan of the chest did not show any other metastatic lesions or lymphadenopathy. His CT scan of the neck on 01/20/2016 showed the supraglottic region tumor as well as small but asymmetric right level II through level IV lymph nodes.   Clinically, he still have some respiratory distress but appears to be stable. He is currently on oxygen utilizing nasal cannula. He does have dyspnea on minimal exertion and have not done much activity since he's been in the hospital. He reports no other symptoms at this time. He does not report any headaches or blurry vision, syncope or  seizures. He does not report any fevers, chills or sweats. Does report poor appetite and weight loss. He does report hoarseness but no ear pain or dysphagia. He does not report any nausea, vomiting, abdominal pain. Does not report any hematochezia or melena. Does not report any frequency urgency or hesitancy. He did report back pain but no other arthralgias or myalgias. Remaining review of systems unremarkable.   Past Medical History  Diagnosis Date  . Schizophrenia ([place])   . Hypertension   . Hyperlipemia   . GERD (gastroesophageal reflux disease)   . Diabetes mellitus without complication ([place])   :  Past Surgical History  Procedure Laterality Date  . Knee arthroscopy    . Video bronchoscopy Bilateral 01/20/2016    Procedure: VIDEO BRONCHOSCOPY WITHOUT FLUORO;  Surgeon: Rigoberto Noel, MD;  Location: [place] [facility];  Service: Cardiopulmonary;  Laterality: Bilateral;  . Microlaryngoscopy with co2 laser and excision of vocal cord lesion N/A 01/25/2016    Procedure: MICROLARYNGOSCOPY WITH CO2 LASER, DEBULKING OF LARYNX MASS ;  Surgeon: Melida Quitter, MD;  Location: [facility];  Service: ENT;  Laterality: N/A;  :   Current facility-administered medications:  .  0.9 %  sodium chloride infusion, 250 mL, Intravenous, Continuous, [name], MD, Last Rate: 50 mL/hr at 02/01/16 0900, 250 mL at 02/01/16 0900 .  albuterol (PROVENTIL) (2.5 MG/3ML) 0.083% nebulizer solution 2.5 mg, 2.5 mg, Nebulization, TID, Lucious Groves, DO, 2.5 mg at 02/02/16 0749 .  antiseptic oral rinse (CPC / CETYLPYRIDINIUM CHLORIDE 0.05%) solution 7 mL, 7 mL, Mouth Rinse, q12n4p, Juanito Doom, MD, 7 mL at 02/02/16 1200 .  antiseptic oral  rinse solution (CORINZ), 7 mL, Mouth Rinse, 10 times per day, [name], MD, 7 mL at 02/02/16 0920 .  chlorhexidine (PERIDEX) 0.12 % solution 15 mL, 15 mL, Mouth Rinse, BID, Juanito Doom, MD, 15 mL at 02/01/16 1029 .  chlorhexidine gluconate (SAGE KIT) (PERIDEX) 0.12 % solution 15 mL, 15 mL,  Mouth Rinse, BID, [name], MD, 15 mL at 02/02/16 0800 .  cloZAPine (CLOZARIL) tablet 100 mg, 100 mg, Oral, QHS, Francesca Oman, DO .  divalproex (DEPAKOTE) DR tablet 500 mg, 500 mg, Oral, q morning - 10a, 500 mg at 02/02/16 1109 **AND** divalproex (DEPAKOTE) DR tablet 1,000 mg, 1,000 mg, Oral, QHS, Juanito Doom, MD .  enoxaparin (LOVENOX) injection 40 mg, 40 mg, Subcutaneous, Q24H, Francesca Oman, DO, 40 mg at 02/02/16 1110 .  insulin aspart (novoLOG) injection 0-15 Units, 0-15 Units, Subcutaneous, TID WC, [name], MD, 5 Units at 02/02/16 1209 .  insulin aspart (novoLOG) injection 0-5 Units, 0-5 Units, Subcutaneous, QHS, Vineet Sood, MD .  pantoprazole (PROTONIX) EC tablet 40 mg, 40 mg, Oral, Q1200, [name], MD, 40 mg at 02/02/16 1200 .  piperacillin-tazobactam (ZOSYN) IVPB 3.375 g, 3.375 g, Intravenous, 3 times per day, Juanito Doom, MD, 3.375 g at 02/02/16 0600 .  vancomycin (VANCOCIN) IVPB 1000 mg/200 mL premix, 1,000 mg, Intravenous, Q12H, Tanna Furry, MD, 1,000 mg at 02/02/16 0500:  No Known Allergies:  Family History  Problem Relation Age of Onset  . CAD Mother   . Lung cancer Father   . CAD Brother   . Diabetes Other   . Stroke Neg Hx   :  Social History   Social History  . Marital Status: Single    Spouse Name: N/A  . Number of Children: N/A  . Years of Education: N/A   Occupational History  . Not on file.   Social History Main Topics  . Smoking status: Current Every Day Smoker  . Smokeless tobacco: Not on file  . Alcohol Use: Yes  . Drug Use: No  . Sexual Activity: Yes   Other Topics Concern  . Not on file   Social History Narrative   He lives independently but has brothers and sisters live locally. He does not work but have worked few jobs in the past. He smokes heavily and have done so for over 40 years.   Pertinent items are noted in HPI.  Exam: Blood pressure 116/69, pulse 89, temperature 98.5 F (36.9 C), temperature source Oral,  resp. rate 25, height 5' 10"  (1.778 m), weight 154 lb 5.2 oz (70 kg), SpO2 92 %. General appearance: alert and cooperative appeared in some mild respiratory distress. Head: Normocephalic, without obvious abnormality. No oral ulcers or lesions. Neck: no adenopathy or thyromegaly. Back: negative Resp: Expiratory wheezes noted bilaterally. Decreased breath sounds on the left. Chest wall: no tenderness Cardio: regular rate and rhythm, S1, S2 normal, no murmur, click, rub or gallop GI: soft, non-tender; bowel sounds normal; no masses,  no organomegaly Extremities: extremities normal, atraumatic, no cyanosis or edema Pulses: 2+ and symmetric Skin: Skin color, texture, turgor normal. No rashes or lesions Lymph nodes: Cervical, supraclavicular, and axillary nodes normal.   Recent Labs  02/01/16 0306 02/02/16 0349  WBC 20.0* 23.8*  HGB 13.5 13.2  HCT 39.0 38.0*  PLT 147* 198    Recent Labs  02/01/16 0306 02/02/16 0349  NA 131* 138  K 4.6 4.2  CL 95* 101  CO2 22 26  GLUCOSE 261* 162*  BUN 9 17  CREATININE 0.76 0.61  CALCIUM 8.7* 9.1       Dg Chest 2 View  01/09/2016  CLINICAL DATA:  Gradual onset productive cough.  Current smoker. EXAM: CHEST  2 VIEW COMPARISON:  12/13/2012 chest radiograph. FINDINGS: Normal heart size. Mildly tortuous thoracic aorta. Questionable mild thickening of the right paratracheal stripe. No pneumothorax. No pleural effusion. There is a new focal masslike opacity in the posterior left upper lung. There is new hyperlucency throughout the left lung. No pulmonary edema. IMPRESSION: 1. New focal masslike opacity in the posterior left upper lung, cannot exclude pulmonary neoplasm. 2. Questionable mild thickening of the right paratracheal stripe, adenopathy not excluded. 3. Recommend further evaluation with chest CT with IV contrast. Electronically Signed   By: Ilona Sorrel M.D.   On: 01/09/2016 12:33   Ct Soft Tissue Neck W Contrast  01/20/2016  CLINICAL DATA:   60 year old male with hoarseness since February. Laryngeal mass. Subsequent encounter. EXAM: CT NECK WITH CONTRAST TECHNIQUE: Multidetector CT imaging of the neck was performed using the standard protocol following the bolus administration of intravenous contrast. CONTRAST:  35m OMNIPAQUE IOHEXOL 300 MG/ML  SOLN COMPARISON:  Head CT without contrast 12/13/2012. Chest CTA 01/18/2016. 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 The lesion encompasses 18 x 17 x 23 mm (AP by transverse by CC). See series 21, image 63 and sagittal image 66. The right aryepiglottic fold appears invaded on series 201, image 65. The false cords appear relatively spared. The true cords are unaffected. The tumor does not involve the laryngeal cartilages but does track into the pre epiglottic fat. Pharyngeal soft tissue contours are within normal limits. Negative parapharyngeal and retropharyngeal spaces. Salivary glands: Negative sublingual space, submandibular glands and parotid glands. Thyroid: Negative. Lymph nodes: Right side level 2 and level 3 lymph nodes are small, measuring 5-6 mm short axis, but are larger than those on the left. Compare sagittal images 44 and 45 on the right to images 87 and 88 on the left. There is also a conspicuous right level 4 lymph node measuring 7 mm short axis on series 21, image 89. No no larger nodes. Normal level 1 nodes and level 5 nodes. No cystic or necrotic nodes. Vascular: Major vascular structures in the neck and at the skullbase are patent. Calcified atherosclerosis at the skull base. Limited intracranial: Negative. Visualized orbits: Negative. Mastoids and visualized paranasal sinuses: Clear. Skeleton: Scattered poor dentition. Lower cervical spine osseous disc and endplate degeneration. No acute osseous abnormality identified. Upper chest: Stable from recent chest  CTA (please see that report). IMPRESSION: 1. 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 Invasion of the right AE fold suspected. No laryngeal cartilage or paralaryngeal soft tissue involvement. 2. Small but asymmetrically conspicuous right level 2 through level 4 lymph nodes, up to 7 mm short axis. Early nodal metastatic disease not excluded. 3. Abnormal upper chest as reported on 01/18/2016 (please see that report). 4. Scattered poor dentition. Electronically Signed   By: HGenevie AnnM.D.   On: 01/20/2016 16:38   Ct Angio Chest Pe W/cm &/or Wo Cm  01/18/2016  CLINICAL DATA:  Cough and shortness of breath for many months. Nodular opacity seen in left upper lung on chest radiograph. EXAM: CT ANGIOGRAPHY CHEST WITH CONTRAST TECHNIQUE: Multidetector CT imaging of the chest  was performed using the standard protocol during bolus administration of intravenous contrast. Multiplanar CT image reconstructions and MIPs were obtained to evaluate the vascular anatomy. CONTRAST:  50m OMNIPAQUE IOHEXOL 350 MG/ML SOLN COMPARISON:  Chest 01/09/2016 FINDINGS: Technically adequate study with good opacification of the central and segmental pulmonary arteries. No focal filling defects are demonstrated. No evidence of significant pulmonary embolus. Normal heart size. Mildly dilated esophagus with small air-fluid level. This may be due to dysmotility disorder. Prominent mediastinal lymph nodes with mild enlargement of a right paratracheal lymph node, measuring 16 mm short axis dimension. This is nonspecific in could be reactive or metastatic. There is diffuse emphysematous change throughout the lungs but more prominent in the left lung. Alveolar infiltrates in the right lung likely represent pneumonia but could indicate asymmetric edema. There is an abrupt termination to the left lower lobe bronchus with complete collapse of the left lower lung. This suggest an  endobronchial obstructing lesion. Bronchoscopy may be useful for further evaluation. Masslike consolidation in the left upper lung with associated left pleural effusion and tenting of the pleura. This measures about 3.8 cm maximal diameter. Appearance is suspicious for a mass lesion although a focal consolidation could potentially have this appearance. No pneumothorax. Included portions of the upper abdominal organs are grossly unremarkable. Degenerative changes in the spine. No destructive bone lesions. Review of the MIP images confirms the above findings. IMPRESSION: 1. No evidence of significant pulmonary embolus. 2. Diffuse emphysematous changes in the lungs. Diffuse alveolar infiltration in the right lung is probably due to pneumonia but asymmetric edema could also have this appearance. 3. Collapse of the left lower lung with abrupt termination of the left lower lobe bronchus, suspicious for endobronchial lesion. Consider bronchoscopy for further evaluation. 4. Masslike consolidation in the left upper lung with adjacent pleural effusion and tenting. This is highly suspicious for a mass lesion. 5. Esophageal dilatation likely due to dysmotility although distal stricture not excluded. Electronically Signed   By: WLucienne CapersM.D.   On: 01/18/2016 21:27   Mr Thoracic Spine Wo Contrast  01/27/2016  CLINICAL DATA:  Back pain. EXAM: MRI THORACIC AND LUMBAR SPINE WITHOUT CONTRAST TECHNIQUE: Multiplanar and multiecho pulse sequences of the thoracic and lumbar spine were obtained without intravenous contrast. COMPARISON:  Chest CTA 01/18/2016. FINDINGS: MR THORACIC SPINE FINDINGS Vertebral alignment is normal. Multiple Schmorl's node deformities are again seen throughout the mid and lower thoracic spine. No significant vertebral marrow edema is seen. No destructive osseous lesion is identified on this unenhanced study. The thoracic spinal cord is normal in caliber and signal. Left lower lobe collapse is more  fully evaluated on recent CT. There is a small left pleural effusion. Mild disc bulging is present at T2-3 and T3-4 without stenosis. Right-sided anterior endplate spurring is present throughout the thoracic spine. There is asymmetric, mild to moderate left facet arthrosis at T6-7 without evidence of neural impingement. MR LUMBAR SPINE FINDINGS Numbering is continued from the thoracic spine, counting down from the craniocervical junction. The conus medullaris terminates at the inferior aspect of L1 on the thoracic study. The yields 6 lumbar type vertebral bodies, and the lowest will be considered a lumbarized S1. Vertebral alignment is normal. Schmorl's nodes are noted at L1-2. No significant vertebral marrow edema is seen. Disc desiccation is present from L1-2-L5-S1. Mild type 2 degenerative endplate changes are present at L2-3 with anterior endplate osteophytosis noted. There is mild disc space narrowing at L1-2 greater than L2-3, and there is at most  minimal narrowing L5-S1. Paraspinal soft tissues are unremarkable. The cecum and ascending colon are prominent in size and likely contain a large amount of stool. L1-2 through L4-5: No disc herniation or stenosis. L5-S1: Mild disc bulging, shallow right subarticular disc protrusion, and moderate facet arthrosis result in mild right lateral recess stenosis. No spinal canal or neural foraminal stenosis. S1-2: Mild right and moderate left facet arthrosis without disc herniation or stenosis. IMPRESSION: MR THORACIC SPINE IMPRESSION 1. Mild thoracic spondylosis without stenosis. 2. Multiple chronic Schmorl's node deformities. MR LUMBAR SPINE IMPRESSION 1. Transitional lumbosacral anatomy as above. 2. Mild lumbar spondylosis and moderate lower lumbar facet arthrosis. Mild right lateral recess stenosis at L4-5. Electronically Signed   By: Logan Bores M.D.   On: 01/27/2016 18:50   Mr Lumbar Spine Wo Contrast  01/27/2016  CLINICAL DATA:  Back pain. EXAM: MRI THORACIC AND  LUMBAR SPINE WITHOUT CONTRAST TECHNIQUE: Multiplanar and multiecho pulse sequences of the thoracic and lumbar spine were obtained without intravenous contrast. COMPARISON:  Chest CTA 01/18/2016. FINDINGS: MR THORACIC SPINE FINDINGS Vertebral alignment is normal. Multiple Schmorl's node deformities are again seen throughout the mid and lower thoracic spine. No significant vertebral marrow edema is seen. No destructive osseous lesion is identified on this unenhanced study. The thoracic spinal cord is normal in caliber and signal. Left lower lobe collapse is more fully evaluated on recent CT. There is a small left pleural effusion. Mild disc bulging is present at T2-3 and T3-4 without stenosis. Right-sided anterior endplate spurring is present throughout the thoracic spine. There is asymmetric, mild to moderate left facet arthrosis at T6-7 without evidence of neural impingement. MR LUMBAR SPINE FINDINGS Numbering is continued from the thoracic spine, counting down from the craniocervical junction. The conus medullaris terminates at the inferior aspect of L1 on the thoracic study. The yields 6 lumbar type vertebral bodies, and the lowest will be considered a lumbarized S1. Vertebral alignment is normal. Schmorl's nodes are noted at L1-2. No significant vertebral marrow edema is seen. Disc desiccation is present from L1-2-L5-S1. Mild type 2 degenerative endplate changes are present at L2-3 with anterior endplate osteophytosis noted. There is mild disc space narrowing at L1-2 greater than L2-3, and there is at most minimal narrowing L5-S1. Paraspinal soft tissues are unremarkable. The cecum and ascending colon are prominent in size and likely contain a large amount of stool. L1-2 through L4-5: No disc herniation or stenosis. L5-S1: Mild disc bulging, shallow right subarticular disc protrusion, and moderate facet arthrosis result in mild right lateral recess stenosis. No spinal canal or neural foraminal stenosis. S1-2:  Mild right and moderate left facet arthrosis without disc herniation or stenosis. IMPRESSION: MR THORACIC SPINE IMPRESSION 1. Mild thoracic spondylosis without stenosis. 2. Multiple chronic Schmorl's node deformities. MR LUMBAR SPINE IMPRESSION 1. Transitional lumbosacral anatomy as above. 2. Mild lumbar spondylosis and moderate lower lumbar facet arthrosis. Mild right lateral recess stenosis at L4-5. Electronically Signed   By: Logan Bores M.D.   On: 01/27/2016 18:50      Assessment and Plan:   60 year old gentleman with the following issues:  1. Squamous cell carcinoma of the larynx: He presented with hoarseness and a 2.3 cm supraglottic laryngeal mass. He does have significant smoking history which might have contributed to this etiology. His CT scan of the neck did not show any evidence of obvious lymphadenopathy.  From a management standpoint, definitive therapy with chemoradiation is the standard of care at this time. I do not think he needs  neoadjuvant systemic chemotherapy at this time. Ideally, obtaining a PET CT scan for staging purposes would be the next step. This could be deferred because of the urgency of need for treatment and him.  I think he would be a reasonable cisplatinum candidate with good kidney function and reasonable performance status. I would probably use 40 mg/m on a weekly basis to start with radiation therapy. Complications from this regimen would include nausea, fatigue, myelosuppression, renal insufficiency, electrolyte imbalance among others.  2. Squamous cell carcinoma of the lung presenting with endobronchial lesion and left lower lung collapse. I see no evidence of clear-cut metastasis in the thorax or any regional lymphadenopathy. PET scan would be helpful to complete the staging with CT scan of the abdomen and pelvis would be reasonable at this time instead.  Whether this is a primary lung cancer or metastatic squamous cell carcinoma of the larynx is unclear.  It is very possible that we are dealing with 2 separate primaries that could be potentially treatable.  The first up is to proceed with radiation therapy to improve aeration of the left lower lung and concomitant chemotherapy might be reasonable if we are attempting to treat his head and neck cancer as well.  We will obtain PDL 1 testing incase his staging workup showed metastatic disease as that might facilitate his ability to be treated with Pembrolizumab.  3. Airway protection: He was evaluated by ENT and tracheostomy is not done at this time but potentially could be needed in the future.  4. Nutrition: This will be an issue for him moving forward and might require PEG tube placement.  5. IV access: It would be reasonable if he has to have a PEG tube is to place a Port-A-Cath at same time to allow [place] to administer systemic chemotherapy in the future that way.  6. Psychosocial support: He has history of schizophrenia and he might have a lot of challenges and coping with this demanding therapy and will need to have all resources available for him moving forward.  Multidisciplinary approach is needed for the best care for him.

## 2016-02-02 NOTE — Progress Notes (Signed)
Notified Elink RN that patient has started coughing up blood. Episodes have been rare and sputum size is about the size of a dime. Will continue to monitor patient. Gildardo Cranker, RN

## 2016-02-02 NOTE — Progress Notes (Signed)
Specimen from bronch collected labeled with rec, RT Larkin Ina said he will walk specimen to lab. Patient VS stable throughout procedure. Will continue to monitor.

## 2016-02-02 NOTE — Progress Notes (Signed)
  Radiation Oncology         [phone] ________________________________  Name: MUNEER LEIDER MRN: [id number]  Date: 01/31/2016  DOB: [birth year]  Chart Note:  I reviewed this patient's most recent findings and wanted to take a minute to document my impression.  He was being worked up for an epiglottic tumor and LUL mass           Presented to ED with left thorax opacification.  Bronch done with LUL mass obstruction.  Transferred to [facility] for XRT.  Will likely simulate on 4/7 for radiotherapy to improve left lung aeration.  If stable, agree with additional staging and possible definitive chemo and radiation.  ------------------------------------------------   Tyler Pita, MD [place] Director and Director of Stereotactic Radiosurgery Direct Dial: [phone]  Fax: [phone] [email or web address]  Skype  LinkedIn

## 2016-02-03 ENCOUNTER — Ambulatory Visit
Admit: 2016-02-03 | Discharge: 2016-02-03 | Disposition: A | Discharge status: Home / Self Care | Payer: Medicare Other | Attending: Radiation Oncology | Admitting: Radiation Oncology

## 2016-02-03 DIAGNOSIS — C3490 Malignant neoplasm of unspecified part of unspecified bronchus or lung: Secondary | ICD-10-CM

## 2016-02-03 DIAGNOSIS — C3412 Malignant neoplasm of upper lobe, left bronchus or lung: Secondary | ICD-10-CM

## 2016-02-03 LAB — BASIC METABOLIC PANEL
ANION GAP: 11 (ref 5–15)
BUN: 26 mg/dL — ABNORMAL HIGH (ref 6–20)
CALCIUM: 9.1 mg/dL (ref 8.9–10.3)
CO2: 25 mmol/L (ref 22–32)
CREATININE: 0.69 mg/dL (ref 0.61–1.24)
Chloride: 107 mmol/L (ref 101–111)
Glucose, Bld: 172 mg/dL — ABNORMAL HIGH (ref 65–99)
Potassium: 4.3 mmol/L (ref 3.5–5.1)
SODIUM: 143 mmol/L (ref 135–145)

## 2016-02-03 LAB — CULTURE, BLOOD (ROUTINE X 2)

## 2016-02-03 LAB — CBC
HCT: 37.4 % — ABNORMAL LOW (ref 39.0–52.0)
HEMOGLOBIN: 13.1 g/dL (ref 13.0–17.0)
MCH: 29 pg (ref 26.0–34.0)
MCHC: 35 g/dL (ref 30.0–36.0)
MCV: 82.7 fL (ref 78.0–100.0)
Platelets: 230 10*3/uL (ref 150–400)
RBC: 4.52 MIL/uL (ref 4.22–5.81)
RDW: 14.6 % (ref 11.5–15.5)
WBC: 17.5 10*3/uL — AB (ref 4.0–10.5)

## 2016-02-03 LAB — GLUCOSE, CAPILLARY
GLUCOSE-CAPILLARY: 120 mg/dL — AB (ref 65–99)
GLUCOSE-CAPILLARY: 178 mg/dL — AB (ref 65–99)
GLUCOSE-CAPILLARY: 128 mg/dL — AB (ref 65–99)
Glucose-Capillary: 193 mg/dL — ABNORMAL HIGH (ref 65–99)

## 2016-02-03 LAB — VANCOMYCIN, TROUGH: VANCOMYCIN TR: 8 ug/mL — AB (ref 10.0–20.0)

## 2016-02-03 MED ORDER — ALBUTEROL SULFATE (2.5 MG/3ML) 0.083% IN NEBU
2.5000 mg | INHALATION_SOLUTION | RESPIRATORY_TRACT | Status: DC | PRN
  Administered 2016-02-03: 2.5 mg via RESPIRATORY_TRACT

## 2016-02-03 MED ORDER — SODIUM CHLORIDE 3 % IN NEBU
5.0000 mL | INHALATION_SOLUTION | Freq: Two times a day (BID) | RESPIRATORY_TRACT | Status: AC
  Administered 2016-02-03 – 2016-02-05 (×5): 5 mL via RESPIRATORY_TRACT
  Filled 2016-02-03 (×8): qty 8

## 2016-02-03 MED ORDER — ENSURE ENLIVE PO LIQD
237.0000 mL | Freq: Three times a day (TID) | ORAL | Status: DC
  Administered 2016-02-03 – 2016-02-06 (×8): 237 mL via ORAL

## 2016-02-03 MED ORDER — VANCOMYCIN HCL IN DEXTROSE 1-5 GM/200ML-% IV SOLN
1000.0000 mg | Freq: Three times a day (TID) | INTRAVENOUS | Status: DC
  Administered 2016-02-04 – 2016-02-05 (×4): 1000 mg via INTRAVENOUS
  Filled 2016-02-03 (×3): qty 200

## 2016-02-03 NOTE — Progress Notes (Signed)
Initial Nutrition Assessment  DOCUMENTATION CODES:   Not applicable  INTERVENTION:  -Ensure Enlive po TID, each supplement provides 350 kcal and 20 grams of protein -RD continue to monitor  NUTRITION DIAGNOSIS:   Inadequate oral intake related to poor appetite as evidenced by per patient/family report.  GOAL:   Patient will meet greater than or equal to 90% of their needs  MONITOR:   Supplement acceptance, PO intake, Labs, Weight trends  REASON FOR ASSESSMENT:   Malnutrition Screening Tool    ASSESSMENT:   Chad Wang is a 60 year old male with PMH notable for recent diagnosis of invasive squamous cell carcinoma of epiglottis which was diagnosed and partially resected on 01/25/16. During that hospitalization he was also treated for a post obstructive PNA with Levaquin. He was discharged yesterday on 01/30/16 and reports that he was overall feeling better and recovering with plenty of sleep. This afternoon he got up out of bed to go to the bathroom and was very short of breath which prompted him to call EMS  Spoke with Chad Wang at bedside; He is on the non-rebreather mask. He was previously admitted at [facility] for LLL mass with post-obstructive PNA and scc of epiglottis. S/p resection of epiglottis 3/29 -Returned for dyspnea -> intubated -> self extubated 1 day later  Underwent bronch - found to have SCC -> tx to [facility] for radiotherapy, staging, poss chemo/radiation.  He is a pleasant man, claims normal weight to be 167# - currently 156 but has gotten as low as 152# this admission.  He has only been able to drink liquids so far. Claims he consumed 3 milks and 2 ensures yesterday. Encouraged pt to continue to consume ensure, will provide TID.  Nutrition-Focused physical exam completed. Findings are no fat depletion, mild muscle depletion, and no edema.   Continue to monitor for initiation of radiation/chemo -> help patient work through side effects if he remains  admitted.  Labs and Medications reviewed.   Diet Order:  Diet Carb Modified Fluid consistency:: Thin; Room service appropriate?: Yes  Skin:  Wound (see comment) (Stg I PU over bony prominence)  Last BM:  4/3  Height:   Ht Readings from Last 1 Encounters:  02/02/16 '5\' 10"'$  (1.778 m)    Weight:   Wt Readings from Last 1 Encounters:  02/03/16 155 lb 10.3 oz (70.6 kg)    Ideal Body Weight:  76.8 kg  BMI:  Body mass index is 22.33 kg/(m^2).  Estimated Nutritional Needs:   Kcal:  1900-2100  Protein:  90-100 grams  Fluid:  >/= 1.9L  EDUCATION NEEDS:   No education needs identified at this time  Satira Anis. Delia Slatten, MS, RD LDN After Hours/Weekend Pager [phone]

## 2016-02-03 NOTE — Progress Notes (Signed)
  Radiation Oncology         [phone] ________________________________  Name: AUTHER LYERLY MRN: [id number]  Date: 02/03/2016  DOB: [birth year]  SIMULATION AND TREATMENT PLANNING NOTE    ICD-9-CM ICD-10-CM   1. Primary cancer of left upper lobe of lung (HCC) 162.3 C34.12     DIAGNOSIS:  60 year-old gentleman with epiglottic tumor and LUL mass  NARRATIVE:  The patient was brought to the [facility].  Identity was confirmed.  All relevant records and images related to the planned course of therapy were reviewed.  The patient freely provided informed written consent to proceed with treatment after reviewing the details related to the planned course of therapy. The consent form was witnessed and verified by the simulation staff.  Then, the patient was set-up in a stable reproducible  supine position for radiation therapy.  CT images were obtained.  Surface markings were placed.  The CT images were loaded into the planning software.  Then the target and avoidance structures were contoured.  Treatment planning then occurred.  The radiation prescription was entered and confirmed.  Then, I designed and supervised the construction of a total of 3 medically necessary complex treatment devices in th form of MLC apertures covering the target, sparing the heart and spinal cord.  I have requested : 3D Simulation  I have requested a DVH of the following structures: heart, left lung, right lung, and spinal cord.    PLAN:  The patient will receive 6 Gy in one fraction followed by 15 Gy more in 5 fractions to initiate a response and potentially open the left mainstem bronchus.  ________________________________  Sheral Apley. Tammi Klippel, M.D.  This document serves as a record of services personally performed by Tyler Pita, MD. It was created on his behalf by Arlyce Harman, a trained medical scribe. The creation of this record is based on the scribe's personal observations and the  provider's statements to them. This document has been checked and approved by the attending provider.

## 2016-02-03 NOTE — Progress Notes (Signed)
PULMONARY / CRITICAL CARE MEDICINE   Name: Chad Wang MRN: [id number] DOB: 1956-03-10    ADMISSION DATE:  01/31/2016  REFERRING MD:  ED physician  CHIEF COMPLAINT:  Shortness of breath  SUBJECTIVE:  Breathing better.  Denies throat or chest pain.  Reports less hemoptysis, C/O some back pain 2/2 laying in bed. States he has a good appetite, but desats on [place]. No Known Allergies  No current facility-administered medications on file prior to encounter.   Current Outpatient Prescriptions on File Prior to Encounter  Medication Sig  . albuterol (PROVENTIL HFA;VENTOLIN HFA) 108 (90 Base) MCG/ACT inhaler Inhale 2 puffs into the lungs every 6 (six) hours as needed for wheezing or shortness of breath.  Marland Kitchen amLODipine (NORVASC) 10 MG tablet Take 10 mg by mouth daily.  . chlorpheniramine-HYDROcodone (TUSSIONEX) 10-8 MG/5ML SUER Take 5 mLs by mouth every 12 (twelve) hours.  . cloZAPine (CLOZARIL) 100 MG tablet Take 100 mg by mouth at bedtime.   . divalproex (DEPAKOTE) 500 MG DR tablet Take 500-1,000 mg by mouth 2 (two) times daily. Takes 565m in am and 10024min pm  . docusate sodium (COLACE) 100 MG capsule Take 1 capsule (100 mg total) by mouth 2 (two) times daily.  . feeding supplement, GLUCERNA SHAKE, (GLUCERNA SHAKE) LIQD Take 237 mLs by mouth 2 (two) times daily between meals.  . Marland Kitchenemfibrozil (LOPID) 600 MG tablet Take 600 mg by mouth 2 (two) times daily.  . metFORMIN (GLUCOPHAGE) 1000 MG tablet Take 500 mg by mouth 2 (two) times daily with a meal.   . mometasone-formoterol (DULERA) 200-5 MCG/ACT AERO Inhale 2 puffs into the lungs 2 (two) times daily.  . Marland Kitcheneomycin-polymyxin-hydrocortisone (CORTISPORIN) otic solution Place 3 drops into the right ear 4 (four) times daily.  . nicotine (NICODERM CQ - DOSED IN MG/24 HOURS) 14 mg/24hr patch Place 1 patch (14 mg total) onto the skin daily.  . Marland Kitchenmeprazole (PRILOSEC) 20 MG capsule Take 20 mg by mouth daily.     VITAL SIGNS: BP 120/76 mmHg  Pulse  66  Temp(Src) 98.7 F (37.1 C) (Oral)  Resp 16  Ht _0  (1.778 m)  Wt 155 lb 10.3 oz (70.6 kg)  BMI 22.33 kg/m2  SpO2 99%  HEMODYNAMICS:    VENTILATOR SETTINGS: Vent Mode:  [-]  FiO2 (%):  [55 %] 55 %  INTAKE / OUTPUT: I/O last 3 completed shifts: In: 2680 [P.O.:480; I.V.:1350; IV Piggyback:850] Out: 2875 [Urine:2875]  PHYSICAL EXAMINATION: General: resting in bed in NAD, watching tv HEENT: /AT Cardiac: RRR, +2+7/0ystolic murmur Pulm: right lung clear, absent BS left lung Abd: soft, nontender, nondistended, BS present Ext: SCDs in place, warm and well perfused, no pedal edema Neuro: alert and oriented, responding appropriately, following commands  LABS:  BMET  Recent Labs Lab 02/01/16 0306 02/02/16 0349 02/03/16 0318  NA 131* 138 143  K 4.6 4.2 4.3  CL 95* 101 107  CO2 _1 BUN 9 17 26*  CREATININE 0.76 0.61 0.69  GLUCOSE 261* 162* 172*    Electrolytes  Recent Labs Lab 02/01/16 0306 02/02/16 0349 02/03/16 0318  CALCIUM 8.7* 9.1 9.1    CBC  Recent Labs Lab 02/01/16 0306 02/02/16 0349 02/03/16 0318  WBC 20.0* 23.8* 17.5*  HGB 13.5 13.2 13.1  HCT 39.0 38.0* 37.4*  PLT 147* 198 230   Glucose  Recent Labs Lab 02/01/16 2345 02/02/16 0402 02/02/16 0719 02/02/16 1207 02/02/16 1757 02/02/16 2148  GLUCAP 144* 150* 167* 237* 157* 113*  Imaging Dg Chest Port 1 View  02/02/2016  CLINICAL DATA:  Respiratory failure.  Shortness breath. EXAM: PORTABLE CHEST 1 VIEW COMPARISON:  02/01/2016. FINDINGS: Interim extubation removal of NG tube. Opacification of the left hemithorax again noted.Persistent infiltrate right lower lobe. Heart size cannot be evaluated. Pulmonary vascularity and the right is normal. No pneumothorax. IMPRESSION: 1. Interim removal of endotracheal tube and NG tube. 2. Persistent opacification of the left hemi thorax. Persistent right lower lobe infiltrate. No interim change. Electronically Signed   By: Marcello Moores  Register    On: 02/02/2016 07:06   STUDIES:  3/24 Bronchoscopy >> supraglottic mass, Lt mainstem bronchus mass 3/30 Laryngoscopy with debulking of laryngeal mass >> Squamous cell carcinoma 04/05 bronchoscopy w/biopsy of mass obstructing left main bronchus 04/06 bronch biopsy c/w SCC   CULTURES: 4/05 Blood >> gram + cocci in clusters 4/05 Urine >> 2000 colonies of insignificant growth  ANTIBIOTICS: 4/04 Vancomycin >> 4/04 Zosyn >>   SIGNIFICANT EVENTS: 03/22 - 04/03 Admit for LLL mass with post-obstructive PNA, hyponatremia 04/04 Re-admit with progressive dyspnea/ intubated 04/05- Self Extubation 04/06 endobronchial biopsy - SCC, transfer to [facility] and remain on PCCM service 04/07 Radiotherapy/ additional staging  LINES/TUBES: 04/05 - 04/06 ETT - self extubated 04/04 peripheral IV  ASSESSMENT / PLAN:  PULMONARY A: Acute hypoxic respiratory failure 2/2 left lung obstruction.  Self-extubated and maintaining SpO2 > 90 requiring 55% VM COPD Tobacco abuse P:   Continue supplemental oxygen.  Patient agrees to BiPAP and/or intubation if deterioration. Continue prn albuterol. Continue Chest PT Tobacco cessation education. 3% saline neb treatments BID Avoid sedating agents   CARDIOVASCULAR A:  Hx of HTN - currently low to normotensive. P:  Hold anti-HTN medications. Trend VS  RENAL A:   Hyponatremia - likely related to lung CA.  Resolved. Bump in BUN from  17 mg/dL to 26 mg/dL P:   Trend BMET daily Strict I&O Consider pre-renal eval if does not resolve  GASTROINTESTINAL A:   Nutrition Hx of GERD P:   Ok to resume diet now that he is extubated and stable. 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 P:  Resume [place] heparin for VTE ppx now that hemoptysis improving. Continue SCDs. Consult oncology and rad onc - recommendations and interventions  appreciated. Meeting with Dr. Tammi Klippel 4/7   INFECTIOUS A:   Post-obstructive PNA Blood cx growing gram + cocci in clusters in 1/2 bottles. Continued Leukocytosis, but down trending. P:   Continue vanc and zoysn - day 3; follow-up final culture and sensitivity.( pending) Trend CBC/ WBC daily  ENDOCRINE A:   DM 2 P:   Continue monitoring CBGs and SSI - change to ac/hs once he resumes diet. D/c steroid.  NEUROLOGIC A:   Hx of schizophrenia - stable. P:   Resume home medications - clozaril and depakote  FAMILY  Family not available. Updated about pending appointment with Dr. Tammi Klippel this morning by RN  Magdalen Spatz, AGACNP-BC [place] Pager # [phone]  02/03/2016, 8:59 AM   PCCM Attending Note: Patient seen and examined with nurse practitioner. Please refer to her progress note which I reviewed in detail. Patient with continuing high oxygen requirement currently on nonrebreather. Continuing airway clearance with hypertonic saline nebs and chest physiotherapy. Avoiding sedating agents. Currently undergoing XRT. Family at bedside and updated by me this morning. Close monitoring of the patient given  high level of oxygen requirement and tenuous respiratory status with potential for reintubation.  I spent a total of 32 minutes of critical care time today caring for the patient, updating family at bedside, and reviewing the patient's electronic medical record.  Sonia Baller Ashok Cordia, M.D. [facility] Pulmonary & Critical Care Pager:  [phone] After 3pm or if no response, call [phone] 5:48 PM 02/03/2016

## 2016-02-03 NOTE — Progress Notes (Signed)
Pharmacy Antibiotic Note  Chad Wang is a 60 y.o. male admitted on 01/31/2016 with SOB, h/o LLL lung mass and possible post-obstructive PNA.  Pharmacy has been consulted for Vancomycin and Zosyn dosing.  Plan: Vanc trough subtherapeutic Increase vanc to 1gm IV q8h Check Vancomycin trough level at steady state Continue present dose of Zosyn 3.375g IV q8h (infuse over 4 hours). Monitor renal function, cultures, clinical course, de-escalation of therapy as appropriate. If second blood culture remains negative (only 1/2 with coag negative Staph), can consider d/c'ing Vancomycin.  Height: '5\' 10"'$  (177.8 cm) Weight: 155 lb 10.3 oz (70.6 kg) IBW/kg (Calculated) : 73  Temp (24hrs), Avg:97.7 F (36.5 C), Min:96.9 F (36.1 C), Max:98.7 F (37.1 C)   Recent Labs Lab 01/30/16 0914 01/31/16 2242 01/31/16 2300 02/01/16 0228 02/01/16 0306 02/02/16 0349 02/03/16 0318 02/03/16 1708  WBC 11.9* 23.5*  --   --  20.0* 23.8* 17.5*  --   CREATININE 0.64 0.63  --  0.75 0.76 0.61 0.69  --   LATICACIDVEN  --   --  1.33  --   --   --   --   --   VANCOTROUGH  --   --   --   --   --   --   --  8*    Estimated Creatinine Clearance: 96.8 mL/min (by C-G formula based on Cr of 0.69).    No Known Allergies  Antimicrobials this admission: 4/5 >> Vancomycin >> 4/5 >> Zosyn >>  Dose adjustments this admission: 4/7 1700 VT: 8  Microbiology results: 4/4 BCx: 1/2 with CoNS (?contaminant) 4/5 UCx: insignificant growth  4/5 MRSA PCR: negative  Thank you for allowing pharmacy to be a part of this patient's care.   Dolly Rias RPh 02/03/2016, 6:24 PM Pager [phone]

## 2016-02-03 NOTE — Progress Notes (Signed)
Pharmacy Antibiotic Note  Chad Wang is a 60 y.o. male admitted on 01/31/2016 with SOB, h/o LLL lung mass and possible post-obstructive PNA.  Pharmacy has been consulted for Vancomycin and Zosyn dosing.  Plan: Continue present dose of Vancomycin. Check Vancomycin trough level prior to next dose this PM. Continue present dose of Zosyn 3.375g IV q8h (infuse over 4 hours). Monitor renal function, cultures, clinical course, de-escalation of therapy as appropriate. If second blood culture remains negative (only 1/2 with coag negative Staph), can consider d/c'ing Vancomycin.  Height: '5\' 10"'$  (177.8 cm) Weight: 155 lb 10.3 oz (70.6 kg) IBW/kg (Calculated) : 73  Temp (24hrs), Avg:97.9 F (36.6 C), Min:97.3 F (36.3 C), Max:98.7 F (37.1 C)   Recent Labs Lab 01/30/16 0914 01/31/16 2242 01/31/16 2300 02/01/16 0228 02/01/16 0306 02/02/16 0349 02/03/16 0318  WBC 11.9* 23.5*  --   --  20.0* 23.8* 17.5*  CREATININE 0.64 0.63  --  0.75 0.76 0.61 0.69  LATICACIDVEN  --   --  1.33  --   --   --   --     Estimated Creatinine Clearance: 96.8 mL/min (by C-G formula based on Cr of 0.69).    No Known Allergies  Antimicrobials this admission: 4/5 >> Vancomycin >> 4/5 >> Zosyn >>  Dose adjustments this admission: 4/7 1700 VT: pending  Microbiology results: 4/4 BCx: 1/2 with CoNS (?contaminant) 4/5 UCx: insignificant growth  4/5 MRSA PCR: negative  Thank you for allowing pharmacy to be a part of this patient's care.   Lindell Spar, PharmD, BCPS Pager: [phone] 02/03/2016 1:43 PM

## 2016-02-04 ENCOUNTER — Inpatient Hospital Stay (HOSPITAL_COMMUNITY): Payer: Medicare Other

## 2016-02-04 DIAGNOSIS — E871 Hypo-osmolality and hyponatremia: Secondary | ICD-10-CM

## 2016-02-04 LAB — GLUCOSE, CAPILLARY
GLUCOSE-CAPILLARY: 150 mg/dL — AB (ref 65–99)
GLUCOSE-CAPILLARY: 171 mg/dL — AB (ref 65–99)
Glucose-Capillary: 163 mg/dL — ABNORMAL HIGH (ref 65–99)
Glucose-Capillary: 132 mg/dL — ABNORMAL HIGH (ref 65–99)

## 2016-02-04 LAB — BASIC METABOLIC PANEL
ANION GAP: 6 (ref 5–15)
BUN: 19 mg/dL (ref 6–20)
CHLORIDE: 102 mmol/L (ref 101–111)
CO2: 26 mmol/L (ref 22–32)
Calcium: 8.6 mg/dL — ABNORMAL LOW (ref 8.9–10.3)
Creatinine, Ser: 0.71 mg/dL (ref 0.61–1.24)
GFR calc non Af Amer: >60 mL/min (ref >60)
Glucose, Bld: 157 mg/dL — ABNORMAL HIGH (ref 65–99)
POTASSIUM: 4.4 mmol/L (ref 3.5–5.1)
Sodium: 134 mmol/L — ABNORMAL LOW (ref 135–145)

## 2016-02-04 LAB — CBC
HEMATOCRIT: 37.2 % — AB (ref 39.0–52.0)
HEMOGLOBIN: 12.8 g/dL — AB (ref 13.0–17.0)
MCH: 28.6 pg (ref 26.0–34.0)
MCHC: 34.4 g/dL (ref 30.0–36.0)
MCV: 83.2 fL (ref 78.0–100.0)
Platelets: 216 10*3/uL (ref 150–400)
RBC: 4.47 MIL/uL (ref 4.22–5.81)
RDW: 14.5 % (ref 11.5–15.5)
WBC: 9.8 10*3/uL (ref 4.0–10.5)

## 2016-02-04 MED ORDER — HEPARIN SODIUM (PORCINE) 5000 UNIT/ML IJ SOLN
5000.0000 [IU] | Freq: Three times a day (TID) | INTRAMUSCULAR | Status: DC
  Administered 2016-02-04 – 2016-02-07 (×11): 5000 [IU] via SUBCUTANEOUS
  Filled 2016-02-04 (×11): qty 1

## 2016-02-04 NOTE — Progress Notes (Signed)
PULMONARY / CRITICAL CARE MEDICINE   Name: Chad Wang MRN: [id number] DOB: 11/15/1955    ADMISSION DATE:  01/31/2016  REFERRING MD:  ED physician  CHIEF COMPLAINT:  Shortness of breath  SUBJECTIVE:  Started XRT therapy on 4/7. Patient reports continued dyspnea when attempting to wean from nonrebreather mask. Denies any chest pain or pressure. Intermittent cough productive of a clear mucus.  REVIEW OF SYSTEMS: No subjective fever, chills, or sweats. No nausea or vomiting.  No Known Allergies  No current facility-administered medications on file prior to encounter.   Current Outpatient Prescriptions on File Prior to Encounter  Medication Sig  . albuterol (PROVENTIL HFA;VENTOLIN HFA) 108 (90 Base) MCG/ACT inhaler Inhale 2 puffs into the lungs every 6 (six) hours as needed for wheezing or shortness of breath.  Marland Kitchen amLODipine (NORVASC) 10 MG tablet Take 10 mg by mouth daily.  . chlorpheniramine-HYDROcodone (TUSSIONEX) 10-8 MG/5ML SUER Take 5 mLs by mouth every 12 (twelve) hours.  . cloZAPine (CLOZARIL) 100 MG tablet Take 100 mg by mouth at bedtime.   . divalproex (DEPAKOTE) 500 MG DR tablet Take 500-1,000 mg by mouth 2 (two) times daily. Takes '500mg'$  in am and '1000mg'$  in pm  . docusate sodium (COLACE) 100 MG capsule Take 1 capsule (100 mg total) by mouth 2 (two) times daily.  . feeding supplement, GLUCERNA SHAKE, (GLUCERNA SHAKE) LIQD Take 237 mLs by mouth 2 (two) times daily between meals.  Marland Kitchen gemfibrozil (LOPID) 600 MG tablet Take 600 mg by mouth 2 (two) times daily.  . metFORMIN (GLUCOPHAGE) 1000 MG tablet Take 500 mg by mouth 2 (two) times daily with a meal.   . mometasone-formoterol (DULERA) 200-5 MCG/ACT AERO Inhale 2 puffs into the lungs 2 (two) times daily.  Marland Kitchen neomycin-polymyxin-hydrocortisone (CORTISPORIN) otic solution Place 3 drops into the right ear 4 (four) times daily.  . nicotine (NICODERM CQ - DOSED IN MG/24 HOURS) 14 mg/24hr patch Place 1 patch (14 mg total) onto the skin  daily.  Marland Kitchen omeprazole (PRILOSEC) 20 MG capsule Take 20 mg by mouth daily.     VITAL SIGNS: BP 110/66 mmHg  Pulse 85  Temp(Src) 98 F (36.7 C) (Axillary)  Resp 16  Ht '5\' 10"'$  (1.778 m)  Wt 153 lb 10.6 oz (69.7 kg)  BMI 22.05 kg/m2  SpO2 98%  HEMODYNAMICS:    VENTILATOR SETTINGS:    INTAKE / OUTPUT: I/O last 3 completed shifts: In: 2001.7 [I.V.:1151.7; IV Piggyback:850] Out: 4400 [NWGNF:6213]  PHYSICAL EXAMINATION: General:  Awake. Alert. No acute distress. Laying in bed.  Integument:  Warm & dry. No rash on exposed skin.  HEENT:  Dry mucus membranes. No scleral injection or icterus.  Cardiovascular:  Regular rate. No edema. No appreciable JVD.  Pulmonary:  Some aeration of the left lung. Symmetric chest wall rise. Normal work of breathing on nonrebreather mask.  Abdomen: Soft. Normal bowel sounds. Nondistended. Grossly nontender. Neurological: Oriented 4. Follow commands. Grossly nonfocal. No meningismus.  LABS:  BMET  Recent Labs Lab 02/02/16 0349 02/03/16 0318 02/04/16 0346  NA 138 143 134*  K 4.2 4.3 4.4  CL 101 107 102  CO2 '26 25 26  '$ BUN 17 26* 19  CREATININE 0.61 0.69 0.71  GLUCOSE 162* 172* 157*    Electrolytes  Recent Labs Lab 02/02/16 0349 02/03/16 0318 02/04/16 0346  CALCIUM 9.1 9.1 8.6*    CBC  Recent Labs Lab 02/02/16 0349 02/03/16 0318 02/04/16 0346  WBC 23.8* 17.5* 9.8  HGB 13.2 13.1 12.8*  HCT 38.0*  37.4* 37.2*  PLT 198 230 216   Glucose  Recent Labs Lab 02/02/16 1757 02/02/16 2148 02/03/16 0758 02/03/16 1319 02/03/16 1604 02/03/16 2135  GLUCAP 157* 113* 120* 193* 178* 128*    Imaging Dg Chest Port 1 View  02/02/2016  CLINICAL DATA:  Respiratory failure.  Shortness breath. EXAM: PORTABLE CHEST 1 VIEW COMPARISON:  02/01/2016. FINDINGS: Interim extubation removal of NG tube. Opacification of the left hemithorax again noted.Persistent infiltrate right lower lobe. Heart size cannot be evaluated. Pulmonary vascularity  and the right is normal. No pneumothorax. IMPRESSION: 1. Interim removal of endotracheal tube and NG tube. 2. Persistent opacification of the left hemi thorax. Persistent right lower lobe infiltrate. No interim change. Electronically Signed   By: Marcello Moores  Register   On: 02/02/2016 07:06   STUDIES:  3/24 Bronchoscopy >> supraglottic mass, Lt mainstem bronchus mass 3/30 Laryngoscopy with debulking of laryngeal mass >> Squamous cell carcinoma 04/05 bronchoscopy w/biopsy of mass obstructing left main bronchus 04/06 bronch biopsy c/w [facility] Port CXR 4/8:  Persistent left lung opacification.   MICROBIOLOGY: Urine Ctx 4/5:  Insignificant growth Blood Ctx x2 4/4>>> Coag Neg Staph 1/2 bottles MRSA PCR 4/5:  Negative   ANTIBIOTICS: Vancomycin 4/4 >> Zosyn 4/4 >>   SIGNIFICANT EVENTS: 03/22 - 04/03 Admit for LLL mass with post-obstructive PNA, hyponatremia 04/04 Re-admit with progressive dyspnea/ intubated 04/05- Self Extubation 04/06 endobronchial biopsy - SCC, transfer to [facility] and remain on PCCM service 04/07 Radiotherapy/ additional staging  LINES/TUBES: PIV x2 OETT 4/5 - 4/6 (self extubated)  ASSESSMENT / PLAN:  PULMONARY A: Acute Hypoxic Respiratory Failure - Secondary to endobronchial obstruction on left. Improving. H/O COPD H/O Tobacco Use  P:   Weaning FiO2 for Sat >90% Chest PT 3% HS Nebs bid Albuterol neb prn  CARDIOVASCULAR A:  H/O HTN - currently normotensive.  P:  Monitor on telemetry Vitals per unit protocol Holding anti-hypertensives  RENAL A:   Hyponatremia - Mild.  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 Left Lung Endobronchial Obstruction  P:  Med Oncology & Rad Oncology consulted XRT - Started 4/7 SCDs Heparin [place] q8hr  INFECTIOUS A:   Post Obstructive Pneumonia    P:   Empiric Vancomycin & Zosyn Day #4 Following Cultures to completion  ENDOCRINE A:   H/O DM Type 2 - BG controlled.  P:   Accu-Checks qAC & HS SSI per algorithm  NEUROLOGIC A:   H/O Schizophrenia  P:   Home Clozaril & Depakote  FAMILY UPDATES:  Family updated 4/7 by Dr. Ashok Cordia. No family at bedside 4/8.  TODAY'S SUMMARY:  60 year old male with squamous cell carcinoma of the head/neck & of the lung as well. Currently undergoing radiation therapy. Continues to have significant oxygen requirement at this time. Overall his respiratory status seems to have remained relatively stable. His glucose seems to be well-controlled as well. Continuing empiric antibiotics for now but I will consider de-escalating to Zosyn alone tomorrow. Patient is planned for another treatment on Monday with his radiation. I did speak with the patient's bedside nurse and we will attempt to wean his oxygen requirement to a Venturi mask today.  Sonia Baller Ashok Cordia, M.D. [facility] Pulmonary & Critical Care Pager:  [phone] After 3pm or if no response, call [phone] 8:01 AM 02/04/2016

## 2016-02-05 DIAGNOSIS — J189 Pneumonia, unspecified organism: Secondary | ICD-10-CM

## 2016-02-05 LAB — CBC WITH DIFFERENTIAL/PLATELET
Basophils Absolute: 0 10*3/uL (ref 0.0–0.1)
Basophils Relative: 0 %
EOS ABS: 0.2 10*3/uL (ref 0.0–0.7)
EOS PCT: 2 %
HCT: 40.1 % (ref 39.0–52.0)
Hemoglobin: 13.7 g/dL (ref 13.0–17.0)
LYMPHS ABS: 1.8 10*3/uL (ref 0.7–4.0)
Lymphocytes Relative: 18 %
MCH: 28 pg (ref 26.0–34.0)
MCHC: 34.2 g/dL (ref 30.0–36.0)
MCV: 82 fL (ref 78.0–100.0)
MONO ABS: 0.8 10*3/uL (ref 0.1–1.0)
MONOS PCT: 8 %
Neutro Abs: 6.8 10*3/uL (ref 1.7–7.7)
Neutrophils Relative %: 72 %
Platelets: 224 10*3/uL (ref 150–400)
RBC: 4.89 MIL/uL (ref 4.22–5.81)
RDW: 14.2 % (ref 11.5–15.5)
WBC: 9.6 10*3/uL (ref 4.0–10.5)

## 2016-02-05 LAB — RENAL FUNCTION PANEL
Albumin: 3.1 g/dL — ABNORMAL LOW (ref 3.5–5.0)
Anion gap: 10 (ref 5–15)
BUN: 14 mg/dL (ref 6–20)
CALCIUM: 8.9 mg/dL (ref 8.9–10.3)
CO2: 25 mmol/L (ref 22–32)
CREATININE: 0.62 mg/dL (ref 0.61–1.24)
Chloride: 102 mmol/L (ref 101–111)
GFR calc non Af Amer: >60 mL/min (ref >60)
Glucose, Bld: 114 mg/dL — ABNORMAL HIGH (ref 65–99)
Phosphorus: 3.4 mg/dL (ref 2.5–4.6)
Potassium: 4.1 mmol/L (ref 3.5–5.1)
SODIUM: 137 mmol/L (ref 135–145)

## 2016-02-05 LAB — GLUCOSE, CAPILLARY
GLUCOSE-CAPILLARY: 197 mg/dL — AB (ref 65–99)
Glucose-Capillary: 112 mg/dL — ABNORMAL HIGH (ref 65–99)
Glucose-Capillary: 170 mg/dL — ABNORMAL HIGH (ref 65–99)
Glucose-Capillary: 140 mg/dL — ABNORMAL HIGH (ref 65–99)

## 2016-02-05 LAB — MAGNESIUM: Magnesium: 2 mg/dL (ref 1.7–2.4)

## 2016-02-05 NOTE — Progress Notes (Signed)
PULMONARY / CRITICAL CARE MEDICINE   Name: Chad Wang MRN: [id number] DOB: September 26, 1956    ADMISSION DATE:  01/31/2016  REFERRING MD:  ED physician  CHIEF COMPLAINT:  Shortness of breath  SUBJECTIVE:  Started XRT therapy on 4/7. Patient tolerating weaning of supplemental oxygen. Denies any new chest pain or pressure. Reports dyspnea stable. Continuing to have cough intermittently productive of clear phlegm with only rare occasional blood streaking.  REVIEW OF SYSTEMS: No subjective fever, chills, or sweats. No abdominal pain, nausea, or emesis.   No Known Allergies  No current facility-administered medications on file prior to encounter.   Current Outpatient Prescriptions on File Prior to Encounter  Medication Sig  . albuterol (PROVENTIL HFA;VENTOLIN HFA) 108 (90 Base) MCG/ACT inhaler Inhale 2 puffs into the lungs every 6 (six) hours as needed for wheezing or shortness of breath.  Marland Kitchen amLODipine (NORVASC) 10 MG tablet Take 10 mg by mouth daily.  . chlorpheniramine-HYDROcodone (TUSSIONEX) 10-8 MG/5ML SUER Take 5 mLs by mouth every 12 (twelve) hours.  . cloZAPine (CLOZARIL) 100 MG tablet Take 100 mg by mouth at bedtime.   . divalproex (DEPAKOTE) 500 MG DR tablet Take 500-1,000 mg by mouth 2 (two) times daily. Takes '500mg'$  in am and '1000mg'$  in pm  . docusate sodium (COLACE) 100 MG capsule Take 1 capsule (100 mg total) by mouth 2 (two) times daily.  . feeding supplement, GLUCERNA SHAKE, (GLUCERNA SHAKE) LIQD Take 237 mLs by mouth 2 (two) times daily between meals.  Marland Kitchen gemfibrozil (LOPID) 600 MG tablet Take 600 mg by mouth 2 (two) times daily.  . metFORMIN (GLUCOPHAGE) 1000 MG tablet Take 500 mg by mouth 2 (two) times daily with a meal.   . mometasone-formoterol (DULERA) 200-5 MCG/ACT AERO Inhale 2 puffs into the lungs 2 (two) times daily.  Marland Kitchen neomycin-polymyxin-hydrocortisone (CORTISPORIN) otic solution Place 3 drops into the right ear 4 (four) times daily.  . nicotine (NICODERM CQ - DOSED  IN MG/24 HOURS) 14 mg/24hr patch Place 1 patch (14 mg total) onto the skin daily.  Marland Kitchen omeprazole (PRILOSEC) 20 MG capsule Take 20 mg by mouth daily.     VITAL SIGNS: BP 120/82 mmHg  Pulse 109  Temp(Src) 97.7 F (36.5 C) (Axillary)  Resp 23  Ht '5\' 10"'$  (1.778 m)  Wt 153 lb 10.6 oz (69.7 kg)  BMI 22.05 kg/m2  SpO2 92%  HEMODYNAMICS:    VENTILATOR SETTINGS: Vent Mode:  [-]  FiO2 (%):  [45 %-55 %] 45 %  INTAKE / OUTPUT: I/O last 3 completed shifts: In: 3502.5 [P.O.:800; I.V.:1652.5; IV Piggyback:1050] Out: 5750 [Urine:5750]  PHYSICAL EXAMINATION: General:  Sleeping until awoken. Alert. No acute distress.  Integument:  Warm & dry. No rash on exposed skin.  HEENT:  No oral ulcers. No scleral injection or icterus.  Cardiovascular:  Regular rate. No edema. No appreciable JVD.  Pulmonary:  Some aeration of the left lung unchanged. Symmetric chest wall rise. Normal work of breathing on nasal cannula.  Abdomen: Soft. Normal bowel sounds. Nondistended. Grossly nontender. Neurological: Oriented 4. Follow commands. Grossly nonfocal. No meningismus.  LABS:  BMET  Recent Labs Lab 02/03/16 0318 02/04/16 0346 02/05/16 0331  NA 143 134* 137  K 4.3 4.4 4.1  CL 107 102 102  CO2 '25 26 25  '$ BUN 26* 19 14  CREATININE 0.69 0.71 0.62  GLUCOSE 172* 157* 114*    Electrolytes  Recent Labs Lab 02/03/16 0318 02/04/16 0346 02/05/16 0331  CALCIUM 9.1 8.6* 8.9  MG  --   --  2.0  PHOS  --   --  3.4    CBC  Recent Labs Lab 02/03/16 0318 02/04/16 0346 02/05/16 0331  WBC 17.5* 9.8 9.6  HGB 13.1 12.8* 13.7  HCT 37.4* 37.2* 40.1  PLT 230 216 224   Glucose  Recent Labs Lab 02/03/16 2135 02/04/16 0816 02/04/16 1219 02/04/16 1711 02/04/16 2201 02/05/16 0753  GLUCAP 128* 163* 132* 150* 171* 112*    Imaging Dg Chest Port 1 View  02/02/2016  CLINICAL DATA:  Respiratory failure.  Shortness breath. EXAM: PORTABLE CHEST 1 VIEW COMPARISON:  02/01/2016. FINDINGS: Interim  extubation removal of NG tube. Opacification of the left hemithorax again noted.Persistent infiltrate right lower lobe. Heart size cannot be evaluated. Pulmonary vascularity and the right is normal. No pneumothorax. IMPRESSION: 1. Interim removal of endotracheal tube and NG tube. 2. Persistent opacification of the left hemi thorax. Persistent right lower lobe infiltrate. No interim change. Electronically Signed   By: Marcello Moores  Register   On: 02/02/2016 07:06   STUDIES:  3/24 Bronchoscopy >> supraglottic mass, Lt mainstem bronchus mass 3/30 Laryngoscopy with debulking of laryngeal mass >> Squamous cell carcinoma 04/05 bronchoscopy w/biopsy of mass obstructing left main bronchus 04/06 bronch biopsy c/w [facility] Port CXR 4/8:  Persistent left lung opacification.   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 Patient self extubated on 4/6. Currently undergoing radiation therapy. Patient's oxygen requirement is gradually improving as we continue to wean his FiO2. Hemoptysis is minimal at this time. Blood glucose is well controlled on sliding scale insulin per algorithm.  1. 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 Currently undergoing XRT (first dose 4/7). Plan for next dose of XRT on 4/10. 2. Acute hypoxic respiratory failure: Secondary to left lung endobronchial obstruction.  Tolerating weaning of FiO2 to nasal cannula oxygen. Continuing chest PT & 3% hypertonic saline nebs twice a day. Ordering incentive spirometry & out of bed to chair. 3. Left lung postobstructive pneumonia: Continuing on day #5 of Zosyn empirically. Discontinuing vancomycin today. 4. DM Type II: Blood glucose well controlled. Continuing Accu-Cheks before every meal C & at bedtime with sliding scale insulin correction/coverage. 5. Schizophrenia: Continuing on Clozaril & Depakote. 6. H/O HTN:  Currently normotensive. Holding home anti-hypertensive medications.  7. H/O COPD: No signs of exacerbation. Holding home [facility] for now. Albuterol nebulizer treatments as needed. 8. Prophylaxis: SCDs, heparin subcutaneous every 8 hours, & Protonix by mouth daily. 9. Diet: Carbohydrate modified diet. 10. Disposition: Patient to remain and stepdown unit. [facility] will assume care as primary starting 4/10 & PCCM will continue to follow as a Optometrist.   Sonia Baller Ashok Cordia, M.D. [facility] Pulmonary & Critical Care Pager:  [phone] After 3pm or if no response, call [phone] 9:11 AM 02/05/2016

## 2016-02-06 ENCOUNTER — Ambulatory Visit
Admit: 2016-02-06 | Discharge: 2016-02-06 | Disposition: A | Discharge status: Home / Self Care | Payer: Medicare Other | Attending: Radiation Oncology | Admitting: Radiation Oncology

## 2016-02-06 DIAGNOSIS — J9601 Acute respiratory failure with hypoxia: Secondary | ICD-10-CM

## 2016-02-06 DIAGNOSIS — R05 Cough: Secondary | ICD-10-CM

## 2016-02-06 LAB — GLUCOSE, CAPILLARY
Glucose-Capillary: 121 mg/dL — ABNORMAL HIGH (ref 65–99)
Glucose-Capillary: 175 mg/dL — ABNORMAL HIGH (ref 65–99)
Glucose-Capillary: 121 mg/dL — ABNORMAL HIGH (ref 65–99)
Glucose-Capillary: 140 mg/dL — ABNORMAL HIGH (ref 65–99)

## 2016-02-06 LAB — BASIC METABOLIC PANEL
ANION GAP: 8 (ref 5–15)
BUN: 15 mg/dL (ref 6–20)
CHLORIDE: 104 mmol/L (ref 101–111)
CO2: 25 mmol/L (ref 22–32)
Calcium: 8.8 mg/dL — ABNORMAL LOW (ref 8.9–10.3)
Creatinine, Ser: 0.53 mg/dL — ABNORMAL LOW (ref 0.61–1.24)
GFR calc Af Amer: >60 mL/min (ref >60)
GLUCOSE: 148 mg/dL — AB (ref 65–99)
POTASSIUM: 4.3 mmol/L (ref 3.5–5.1)
Sodium: 137 mmol/L (ref 135–145)

## 2016-02-06 LAB — MAGNESIUM: Magnesium: 2 mg/dL (ref 1.7–2.4)

## 2016-02-06 LAB — CULTURE, BLOOD (ROUTINE X 2): Culture: NO GROWTH

## 2016-02-06 MED ORDER — AMOXICILLIN-POT CLAVULANATE 875-125 MG PO TABS
1.0000 | ORAL_TABLET | Freq: Two times a day (BID) | ORAL | Status: DC
  Administered 2016-02-06 – 2016-02-07 (×3): 1 via ORAL
  Filled 2016-02-06 (×3): qty 1

## 2016-02-06 MED ORDER — DICLOFENAC SODIUM 1 % TD GEL
4.0000 g | Freq: Four times a day (QID) | TRANSDERMAL | Status: DC
  Administered 2016-02-06 – 2016-02-07 (×6): 4 g via TOPICAL
  Filled 2016-02-06: qty 100

## 2016-02-06 NOTE — Progress Notes (Signed)
Pharmacy Antibiotic Note  Chad Wang is a 60 y.o. male admitted on 01/31/2016 with SOB, h/o LLL lung mass and possible post-obstructive PNA.  Pharmacy was consulted for Vancomycin and Zosyn dosing. Vanc stopped 4/9. Pulmonary planning to cont Zosyn empirically for 7-10 days.  Plan: Day 6 - continue Zosyn 3.375g IV q8h (infuse over 4 hours). Pharmacy will sign-off since no dosage adjustment is likely to be needed.  Height: '5\' 10"'$  (177.8 cm) Weight: 146 lb 13.2 oz (66.6 kg) IBW/kg (Calculated) : 73  Temp (24hrs), Avg:98 F (36.7 C), Min:97.5 F (36.4 C), Max:98.2 F (36.8 C)   Recent Labs Lab 01/31/16 2300  02/01/16 0306 02/02/16 0349 02/03/16 0318 02/03/16 1708 02/04/16 0346 02/05/16 0331 02/06/16 0540  WBC  --   --  20.0* 23.8* 17.5*  --  9.8 9.6  --   CREATININE  --   < > 0.76 0.61 0.69  --  0.71 0.62 0.53*  LATICACIDVEN 1.33  --   --   --   --   --   --   --   --   VANCOTROUGH  --   --   --   --   --  8*  --   --   --   < > = values in this interval not displayed.  Estimated Creatinine Clearance: 91.3 mL/min (by C-G formula based on Cr of 0.53).    No Known Allergies  Antimicrobials this admission: 4/5 >> Vancomycin >> 4/5 >> Zosyn >>  Dose adjustments this admission: 4/7 1700 VT: 8  Microbiology results: 4/4 BCx: 1/2 with CoNS (?contaminant) 4/5 UCx: insignificant growth  4/5 MRSA PCR: negative  Thank you for allowing pharmacy to be a part of this patient's care.  Romeo Rabon, PharmD, pager [phone]. 02/06/2016,11:09 AM.

## 2016-02-06 NOTE — Progress Notes (Signed)
[facility] Progress Note Patient Name: Chad Wang DOB: November 08, 1955 MRN: [id number]   Date of Service  02/06/2016  HPI/Events of Note  Short run of VT.  Now in sinus and normal BP, oxygenation.   eICU Interventions  Will check BMET, Mg.     Intervention Category Major Interventions: Other:  Ioma Chismar 02/06/2016, 5:31 AM

## 2016-02-06 NOTE — Progress Notes (Signed)
[facility] Progress Note   Chad Wang  UXL:[id number]  DOB: 06-02-1956  DOA: 01/31/2016 PCP: No PCP Per Patient  Brief narrative: Chad Wang is a 60 y.o. male with a h/o HTN, DM, schizophrenia, smoker admitted on 3/22 with hoarseness, cough with yellow sputum, weight loss, poor oral intake and malaise.  CTA of the chest was negative for PE - revealed collapse of LLL with abrupt termination of LLL bronchus suspicious for endobronchial lesion. He also had masslike consolidation of the LUL with adjacent pleural effusion.. Bronch performed by pulmonary team on 3/24 showed a glottic mass and a left mainstem mass. The procedure was aborted. ENT was consulted. He underwent a microlaryngoscopy with debulking of laryngeal mass on 3/29.  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 Treated for pneumonia and discharged on 4/3. Readmitted on 4/4 for dyspnea and CXR showing complete opacification of the left hemithorax thought to be postobstructive PNA and intubated.  Bronch on 4/6 with biopsy- showed sq cell cancer.  Subjective: Lower back pain. No nausea, vomiting, diarrhea or constipation. 2 BMs yesterday.   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 - cont Hypertonic nebs per PCCM  DM 2 - on Metformin at home - sliding scale here - sugars well controlled without insluin  Schizophrenia - Depakote, Clozapine  HTN - meds on hold - normotensive  COPD- smoker - no exacerbation- quit  smoking  Back pain - OOB, voltaren gel   Antibiotics: Anti-infectives    Start     Dose/Rate Route Frequency Ordered Stop   02/04/16 0200  vancomycin (VANCOCIN) IVPB 1000 mg/200 mL premix  Status:  Discontinued     1,000 mg 200 mL/hr over 60 Minutes Intravenous Every 8 hours 02/03/16 1825 02/05/16 0912   02/01/16 0600  vancomycin (VANCOCIN) IVPB 1000 mg/200 mL premix  Status:  Discontinued     1,000 mg 200 mL/hr over 60 Minutes Intravenous Every 12 hours 01/31/16 2350 02/03/16 1825   02/01/16 0600  piperacillin-tazobactam (ZOSYN) IVPB 3.375 g     3.375 g 12.5 mL/hr over 240 Minutes Intravenous 3 times per day 02/01/16 0038     02/01/16 0000  vancomycin (VANCOCIN) IVPB 1000 mg/200 mL premix     1,000 mg 200 mL/hr over 60 Minutes Intravenous  Once 01/31/16 2350 02/01/16 0253   01/31/16 2345  piperacillin-tazobactam (ZOSYN) IVPB 3.375 g     3.375 g 100 mL/hr over 30 Minutes Intravenous  Once 01/31/16 2337 02/01/16 0249     Code Status:     Code Status Orders        Start     Ordered   02/01/16 0027  Full code   Continuous     02/01/16 0028    Code Status History    Date Active Date Inactive Code Status Order ID Comments User Context   01/19/2016 12:45 AM 01/30/2016  5:56 PM Full Code [id number]  Toy Baker, MD Inpatient   08/13/2014 11:28 PM 08/17/2014 12:48  PM Full Code [id number]  Hoy Morn, MD ED   12/13/2012  4:44 PM 12/15/2012  1:28 PM Full Code [id number]  Jasper Riling. Alvino Chapel, MD ED    Advance Directive Documentation        Most Recent Value   Type of Advance Directive  Healthcare Power of Attorney [sister]   Pre-existing out of facility DNR order (yellow form or pink MOST form)     "MOST" Form in Place?       Family Communication:   Disposition Plan: SNF when stable DVT prophylaxis: Heparin Consultants: PCCM, rad onc, med onc Procedures: intubation, Bronchoscopy 4/6, Radiation    Objective: Filed Weights   02/04/16 0500 02/05/16 2100 02/06/16 0500   Weight: 69.7 kg (153 lb 10.6 oz) 69.6 kg (153 lb 7 oz) 66.6 kg (146 lb 13.2 oz)    Intake/Output Summary (Last 24 hours) at 02/06/16 0801 Last data filed at 02/06/16 0600  Gross per 24 hour  Intake   1500 ml  Output   3065 ml  Net  -1565 ml     Vitals Filed Vitals:   02/06/16 0300 02/06/16 0400 02/06/16 0500 02/06/16 0600  BP:  112/84  128/82  Pulse: 85 90 94 78  Temp:   98.2 F (36.8 C)   TempSrc:   Oral   Resp: '19 18 28 16  '$ Height:      Weight:   66.6 kg (146 lb 13.2 oz)   SpO2: 96% 96% 92% 98%    Exam:  General:  Pt is alert, not in acute distress  HEENT: No icterus, No thrush, oral mucosa moist  Cardiovascular: regular rate and rhythm, S1/S2 - 3/6 murmur ar LUS border  Respiratory: clear to auscultation- poor air entry on left lung field  Abdomen: Soft, +Bowel sounds, non tender, non distended, no guarding  MSK: No cyanosis or clubbing- no pedal edema   Data Reviewed: Basic Metabolic Panel:  Recent Labs Lab 02/02/16 0349 02/03/16 0318 02/04/16 0346 02/05/16 0331 02/06/16 0540  NA 138 143 134* 137 137  K 4.2 4.3 4.4 4.1 4.3  CL 101 107 102 102 104  CO2 '26 25 26 25 25  '$ GLUCOSE 162* 172* 157* 114* 148*  BUN 17 26* '19 14 15  '$ CREATININE 0.61 0.69 0.71 0.62 0.53*  CALCIUM 9.1 9.1 8.6* 8.9 8.8*  MG  --   --   --  2.0 2.0  PHOS  --   --   --  3.4  --    Liver Function Tests:  Recent Labs Lab 01/30/16 0914 01/31/16 2242 02/05/16 0331  AST 13* 39  --   ALT 9* 14*  --   ALKPHOS 55 66  --   BILITOT 0.6 1.2  --   PROT 5.0* 5.5*  --   ALBUMIN 2.5* 2.8* 3.1*    Recent Labs Lab 01/31/16 2242  LIPASE 17   No results for input(s): AMMONIA in the last 168 hours. CBC:  Recent Labs Lab 01/30/16 0914 01/31/16 2242 02/01/16 0306 02/02/16 0349 02/03/16 0318 02/04/16 0346 02/05/16 0331  WBC 11.9* 23.5* 20.0* 23.8* 17.5* 9.8 9.6  NEUTROABS 7.9* 20.9* 18.9*  --   --   --  6.8  HGB 13.8 13.4 13.5 13.2 13.1 12.8* 13.7  HCT 39.6 38.7* 39.0 38.0*  37.4* 37.2* 40.1  MCV 83.0 81.1 81.3 82.1 82.7 83.2 82.0  PLT 153 160 147* 198 230 216 224   Cardiac Enzymes: No results for input(s): CKTOTAL, CKMB, CKMBINDEX, TROPONINI in the  last 168 hours. BNP (last 3 results)  Recent Labs  01/18/16 1634 01/31/16 2242  BNP 24.5 69.8    ProBNP (last 3 results) No results for input(s): PROBNP in the last 8760 hours.  CBG:  Recent Labs Lab 02/04/16 2201 02/05/16 0753 02/05/16 1207 02/05/16 1526 02/05/16 2211  GLUCAP 171* 112* 170* 140* 197*    Recent Results (from the past 240 hour(s))  Culture, blood (routine x 2)     Status: None (Preliminary result)   Collection Time: 01/31/16 11:20 PM  Result Value Ref Range Status   Specimen Description BLOOD RIGHT HAND  Final   Special Requests BOTTLES DRAWN AEROBIC AND ANAEROBIC 5ML  Final   Culture NO GROWTH 4 DAYS  Final   Report Status PENDING  Incomplete  Culture, blood (routine x 2)     Status: None   Collection Time: 01/31/16 11:25 PM  Result Value Ref Range Status   Specimen Description BLOOD LEFT FOREARM  Final   Special Requests IN PEDIATRIC BOTTLE 10ML  Final   Culture  Setup Time   Final    GRAM POSITIVE COCCI IN CLUSTERS AEROBIC BOTTLE ONLY CRITICAL RESULT CALLED TO, READ BACK BY AND VERIFIED WITH: K MCCARTY RN 2137 02/01/16 A BROWNING    Culture   Final    STAPHYLOCOCCUS SPECIES (COAGULASE NEGATIVE) THE SIGNIFICANCE OF ISOLATING THIS ORGANISM FROM A SINGLE SET OF BLOOD CULTURES WHEN MULTIPLE SETS ARE DRAWN IS UNCERTAIN. PLEASE NOTIFY THE MICROBIOLOGY DEPARTMENT WITHIN ONE WEEK IF SPECIATION AND SENSITIVITIES ARE REQUIRED.    Report Status 02/03/2016 FINAL  Final  Urine culture     Status: Abnormal   Collection Time: 02/01/16  1:13 AM  Result Value Ref Range Status   Specimen Description URINE, CLEAN CATCH  Final   Special Requests NONE  Final   Culture 2,000 COLONIES/mL INSIGNIFICANT GROWTH (A)  Final   Report Status 02/02/2016 FINAL  Final  MRSA PCR Screening      Status: None   Collection Time: 02/01/16  3:51 AM  Result Value Ref Range Status   MRSA by PCR NEGATIVE NEGATIVE Final    Comment:        The GeneXpert MRSA Assay (FDA approved for NASAL specimens only), is one component of a comprehensive MRSA colonization surveillance program. It is not intended to diagnose MRSA infection nor to guide or monitor treatment for MRSA infections.      Studies: No results found.  Scheduled Meds:  Scheduled Meds: . albuterol  2.5 mg Nebulization TID  . antiseptic oral rinse  7 mL Mouth Rinse q12n4p  . cloZAPine  100 mg Oral QHS  . divalproex  500 mg Oral q morning - 10a   And  . divalproex  1,000 mg Oral QHS  . feeding supplement (ENSURE ENLIVE)  237 mL Oral TID BM  . heparin subcutaneous  5,000 Units Subcutaneous 3 times per day  . insulin aspart  0-15 Units Subcutaneous TID WC  . insulin aspart  0-5 Units Subcutaneous QHS  . pantoprazole  40 mg Oral Q1200  . piperacillin-tazobactam (ZOSYN)  IV  3.375 g Intravenous 3 times per day  . sodium chloride HYPERTONIC  5 mL Nebulization BID   Continuous Infusions: . sodium chloride 250 mL (02/04/16 2300)    Time spent on care of this patient: 35 min   [place], MD 02/06/2016, 8:01 AM  LOS: 6 days   [facility] Office  [phone] Pager - Text Page per [email or web address] If 7PM-7AM, please contact night-coverage [email or web address]

## 2016-02-06 NOTE — Progress Notes (Signed)
Patient transported by bed to [place] with Caryl Pina, [place] at 1100 on [address] [place]. Rollene Fare, RN at bedside.  Chad Wang

## 2016-02-06 NOTE — Progress Notes (Signed)
Called 3 [address] to give report, RN couldn't take report at this time.  [place] [phone]

## 2016-02-06 NOTE — Progress Notes (Signed)
IP PROGRESS NOTE  Subjective:   Events noted in the last few days. Patient appeared stable from a respiratory standpoint point. He is breathing comfortably and sleeping without difficulties.  Objective:  Vital signs in last 24 hours: Temp:  [97.5 F (36.4 C)-98.2 F (36.8 C)] 98.2 F (36.8 C) (04/10 0500) Pulse Rate:  [66-112] 78 (04/10 0600) Resp:  [15-28] 16 (04/10 0600) BP: (112-148)/(51-90) 128/82 mmHg (04/10 0600) SpO2:  [89 %-98 %] 98 % (04/10 0600) FiO2 (%):  [45 %] 45 % (04/09 0802) Weight:  [146 lb 13.2 oz (66.6 kg)-153 lb 7 oz (69.6 kg)] 146 lb 13.2 oz (66.6 kg) (04/10 0500) Weight change:  Last BM Date: 02/04/16  Intake/Output from previous day: 04/09 0701 - 04/10 0700 In: 1950 [P.O.:650; I.V.:1150; IV Piggyback:150] Out: 3065 [Urine:3065] Comfortable-appearing gentleman sleeping smoking. Mouth: mucous membranes moist, pharynx normal without lesions Resp: clear to auscultation bilaterally Cardio: regular rate and rhythm, S1, S2 normal, no murmur, click, rub or gallop GI: soft, non-tender; bowel sounds normal; no masses,  no organomegaly Extremities: extremities normal, atraumatic, no cyanosis or edema    Lab Results:  Recent Labs  02/04/16 0346 02/05/16 0331  WBC 9.8 9.6  HGB 12.8* 13.7  HCT 37.2* 40.1  PLT 216 224    BMET  Recent Labs  02/05/16 0331 02/06/16 0540  NA 137 137  K 4.1 4.3  CL 102 104  CO2 25 25  GLUCOSE 114* 148*  BUN 14 15  CREATININE 0.62 0.53*  CALCIUM 8.9 8.8*    Medications: I have reviewed the patient's current medications.  Assessment/Plan:  60 year old gentleman with the following issues:  1. Squamous cell carcinoma of the larynx: He will be candidate for a definitive chemoradiation if he remains stable in the near future. I anticipate the start of this potentially next week. This might be done as an inpatient given his psychosocial situation. He may need a PEG tube as well as a Port-A-Cath inserted if he remains  stable in the near future.  2. 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 Once this established, definitive therapy would be utilized.  In the meantime, we will complete the staging workup including CT scan of the abdomen. I will defer the imaging for the time being.  3. Respiratory distress: Appears to be stable at this time and I anticipate improvement upon completing his radiation course.    We will continue to follow him closely to help coordinate his future oncology care.   LOS: 6 days   [facility] 02/06/2016, 7:44 AM

## 2016-02-06 NOTE — Progress Notes (Addendum)
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   Evaluated by medical and radiation oncology. Currently undergoing XRT (first dose 4/7).  SUBJECTIVE:  No hemoptysis Afebrile Patient tolerating weaning of supplemental oxygen. Denies any new chest pain or pressure. Reports dyspnea stable.  Continuing to have cough intermittently productive of clear phlegm   REVIEW OF SYSTEMS: No subjective fever, chills, or sweats. No abdominal pain, nausea, or emesis.   VITAL SIGNS: BP 126/77 mmHg  Pulse 88  Temp(Src) 98.1 F (36.7 C) (Oral)  Resp 18  Ht '5\' 10"'$  (1.778 m)  Wt 146 lb 13.2 oz (66.6 kg)  BMI 21.07 kg/m2  SpO2 96%  HEMODYNAMICS:    VENTILATOR SETTINGS:    INTAKE / OUTPUT: I/O last 3 completed shifts: In: 2850.8 [P.O.:650; I.V.:1750.8; IV Piggyback:450] Out: 8657 [Urine:4915]  PHYSICAL EXAMINATION: General:   Alert.interactive  No acute distress.  Integument:  Warm & dry. No rash on exposed skin.  HEENT:  No oral ulcers. No scleral injection or icterus. Voice hoarse Cardiovascular:  Regular rate. No edema. No appreciable JVD.  Pulmonary:  Some aeration of the left lung unchanged. Symmetric chest wall rise. Normal work of breathing on nasal cannula.  Abdomen: Soft. Normal bowel sounds. Nondistended. Grossly nontender. Neurological: Oriented 4. Follow commands. Grossly nonfocal. No meningismus.  LABS:  BMET  Recent Labs Lab 02/04/16 0346 02/05/16 0331 02/06/16 0540  NA 134* 137 137  K 4.4 4.1 4.3  CL 102 102 104  CO2 '26 25 25  '$ BUN '19 14 15  '$ CREATININE 0.71 0.62 0.53*  GLUCOSE 157* 114* 148*    Electrolytes  Recent Labs Lab 02/04/16 0346 02/05/16 0331 02/06/16 0540  CALCIUM 8.6* 8.9 8.8*  MG  --  2.0 2.0  PHOS  --  3.4  --      CBC  Recent Labs Lab 02/03/16 0318 02/04/16 0346 02/05/16 0331  WBC 17.5* 9.8 9.6  HGB 13.1 12.8* 13.7  HCT 37.4* 37.2* 40.1  PLT 230 216 224   Glucose  Recent Labs Lab 02/04/16 2201 02/05/16 0753 02/05/16 1207 02/05/16 1526 02/05/16 2211 02/06/16 0734  GLUCAP 171* 112* 170* 140* 197* 121*    Imaging Reviewed   STUDIES:  3/24 Bronchoscopy >> supraglottic mass, Lt mainstem bronchus mass 3/30 Laryngoscopy with debulking of laryngeal mass >> Squamous cell carcinoma 04/05 bronchoscopy w/biopsy of mass obstructing left main bronchus 04/06 bronch biopsy c/w [facility] Port CXR 4/8:  Persistent left lung opacification.   MICROBIOLOGY: Urine Ctx 4/5:  Insignificant growth Blood Ctx x2 4/4>>> Coag Neg Staph 1/2 bottles, likely contaminant MRSA PCR 4/5:  Negative   ANTIBIOTICS: Vancomycin 4/4 >>4/9 Zosyn 4/4 >>   SIGNIFICANT EVENTS: 03/22 - 04/03 Admit for LLL mass with post-obstructive PNA, hyponatremia 04/04 Re-admit with progressive dyspnea/ intubated 04/05- Self Extubation 04/06 endobronchial biopsy - SCC, transfer to [facility] and remain on PCCM service 04/07 Radiotherapy/ additional staging  LINES/TUBES: PIV x2 OETT 4/5 - 4/6 (self extubated)  A/Plan :  1.  Plan for next dose of XRT today 2. Acute hypoxic respiratory failure: Secondary to left lung endobronchial obstruction. Tolerating weaning of FiO2 to nasal cannula oxygen. Continuing chest PT & 3% hypertonic saline nebs twice a day. Ordering incentive spirometry & out of bed to chair. 3.  Left lung postobstructive pneumonia: Continuing Zosyn empirically 7-10 days, until lung opens up 4. Schizophrenia: Continuing on Clozaril & Depakote. 5. H/O COPD: No signs of exacerbation. Holding home [facility] for now. Albuterol nebulizer treatments as needed.  PCCM available as needed, can transfer to floor  Kara Mead MD. FCCP. [place] Pager [phone] If no response call [phone]    02/06/2016    9:10 AM 02/06/2016

## 2016-02-06 NOTE — Progress Notes (Signed)
Report given to [place], [place], Therapist, sports.  [place]man Waren A Tasha Diaz 10:41 AM

## 2016-02-07 ENCOUNTER — Institutional Professional Consult (permissible substitution): Payer: Self-pay | Admitting: Internal Medicine

## 2016-02-07 ENCOUNTER — Ambulatory Visit
Admit: 2016-02-07 | Discharge: 2016-02-07 | Disposition: A | Discharge status: Home / Self Care | Payer: Medicare Other | Attending: Radiation Oncology | Admitting: Radiation Oncology

## 2016-02-07 DIAGNOSIS — J449 Chronic obstructive pulmonary disease, unspecified: Secondary | ICD-10-CM

## 2016-02-07 DIAGNOSIS — C3490 Malignant neoplasm of unspecified part of unspecified bronchus or lung: Secondary | ICD-10-CM

## 2016-02-07 DIAGNOSIS — F209 Schizophrenia, unspecified: Secondary | ICD-10-CM

## 2016-02-07 LAB — GLUCOSE, CAPILLARY
GLUCOSE-CAPILLARY: 115 mg/dL — AB (ref 65–99)
Glucose-Capillary: 130 mg/dL — ABNORMAL HIGH (ref 65–99)

## 2016-02-07 MED ORDER — AMOXICILLIN-POT CLAVULANATE 875-125 MG PO TABS: 1.0000 | ORAL_TABLET | Freq: Two times a day (BID) | ORAL | Status: DC

## 2016-02-07 MED ORDER — SACCHAROMYCES BOULARDII 250 MG PO CAPS: 250.0000 mg | ORAL_CAPSULE | Freq: Two times a day (BID) | ORAL | Status: DC

## 2016-02-07 NOTE — Evaluation (Addendum)
Physical Therapy Evaluation Patient Details Name: Chad Wang MRN: [id number] DOB: Aug 30, 1956 Today's Date: 02/07/2016   History of Present Illness  Mr. Bloodworth has a  recent admission for a lung and laryngeal mass. Attempted  bronchoscopy finding a large laryngeal mass. This was resected by Dr. Redmond Baseman on 3/29 and noted to be squamous cell carcinoma. He was DC'd to [facility] 01/30/16 for rehab. He returned on 4/4 with worsenign dyspnea and was found to have left lung white out on CXR  Clinical Impression  This patient was  Able to stand and  Pivot to recliner with a RW, on 4  liters of oxygen. Sats 100%, Dyspnea3-4/4. Patient was just [place] to REhab and back to hospital within 1 day for respiratory distress. The patient appears mucch more respiratory compromised than previous EPIC notes from [facility] admission. Pt admitted with above diagnosis. Pt currently with functional limitations due to the deficits listed below (see PT Problem List).  Pt will benefit from skilled PT to increase their independence and safety with mobility to allow discharge to the venue listed below.       Follow Up Recommendations SNF;Supervision/Assistance - 24 hour    Equipment Recommendations  None recommended by PT    Recommendations for Other Services       Precautions / Restrictions Precautions Precautions: Fall Precaution Comments: monitor sats, on high oxygen      Mobility  Bed Mobility Overal bed mobility: Needs Assistance Bed Mobility: Supine to Sit     Supine to sit: Supervision     General bed mobility comments: used bed rail  Transfers Overall transfer level: Needs assistance Equipment used: Rolling walker (2 wheeled) Transfers: Sit to/from [facility] Sit to Stand: Min assist Stand pivot transfers: Min assist       General transfer comment: RW for safety. No LOB or reports of dizziness upon standing. Patient on 4 l oxygen, sats 100%, HR 95. Dyspnea 3/4, rest break  standing  after turning/  Ambulation/Gait                Stairs            Wheelchair Mobility    Modified Rankin (Stroke Patients Only)       Balance Overall balance assessment: Needs assistance Sitting-balance support: Feet supported;No upper extremity supported Sitting balance-Leahy Scale: Fair     Standing balance support: During functional activity;Bilateral upper extremity supported Standing balance-Leahy Scale: Fair                               Pertinent Vitals/Pain Pain Assessment: Faces Faces Pain Scale: Hurts little more Pain Location: my neck Pain Descriptors / Indicators: Discomfort;Guarding Pain Intervention(s): Limited activity within patient's tolerance;Monitored during session    Home Living Family/patient expects to be discharged to:: Skilled nursing facility Living Arrangements: Alone                    Prior Function                 Hand Dominance        Extremity/Trunk Assessment   Upper Extremity Assessment: Generalized weakness           Lower Extremity Assessment: Generalized weakness      Cervical / Trunk Assessment: Other exceptions  Communication      Cognition Arousal/Alertness: Awake/alert Behavior During Therapy: WFL for tasks assessed/performed;Flat affect Overall Cognitive Status: Within Functional Limits  for tasks assessed                      General Comments      Exercises        Assessment/Plan    PT Assessment Patient needs continued PT services  PT Diagnosis Difficulty walking;Generalized weakness   PT Problem List Decreased strength;Decreased activity tolerance;Decreased mobility;Decreased knowledge of use of DME;Decreased knowledge of precautions;Decreased safety awareness;Cardiopulmonary status limiting activity  PT Treatment Interventions DME instruction;Gait training;Functional mobility training;Therapeutic activities;Therapeutic exercise;Balance  training;Patient/family education   PT Goals (Current goals can be found in the Care Plan section) Acute Rehab PT Goals Patient Stated Goal: to get back to therapy PT Goal Formulation: With patient/family Time For Goal Achievement: 02/21/16 Potential to Achieve Goals: Fair    Frequency Min 3X/week   Barriers to discharge        Co-evaluation               End of Session Equipment Utilized During Treatment: Oxygen Activity Tolerance: Patient limited by fatigue Patient left: in chair;with call bell/phone within reach;with chair alarm set;with family/visitor present Nurse Communication: Mobility status         Time: 1142-1201 PT Time Calculation (min) (ACUTE ONLY): 19 min   Charges:   PT Evaluation $PT Eval Low Complexity: 1 Procedure     PT G CodesClaretha Cooper 02/07/2016, 1:06 PM Tresa Endo PT [phone]

## 2016-02-07 NOTE — Clinical Social Work Note (Signed)
Clinical Social Work Assessment  Patient Details  Name: Chad Wang MRN: [id number] Date of Birth: July 25, 1956  Date of referral:  02/07/16               Reason for consult:  Discharge Planning                Permission sought to share information with:  Family Supports Permission granted to share information::  Yes, Verbal Permission Granted  Name::     [name]  Agency::     Relationship::  sister  Contact Information:  [phone]  Housing/Transportation Living arrangements for the past 2 months:  [place] of Information:  Patient, Other (Comment Required) (sister) Patient Interpreter Needed:  None Criminal Activity/Legal Involvement Pertinent to Current Situation/Hospitalization:  No - Comment as needed Significant Relationships:  Siblings Lives with:  Facility Resident Do you feel safe going back to the place where you live?  Yes Need for family participation in patient care:  Yes (Comment) (pt request family involvement in assessment)  Care giving concerns:  Pt admitted from [facility]. Pt and pt family agreeable to pt return.    Social Worker assessment / plan:    CSW received referral that pt admitted from [facility].   CSW met with pt at bedside and introduced self and explained role. Pt confirmed pt came from [place] [facility] and pt agreeable to return. Pt request CSW communicate with pt sister, Joelene Millin regarding disposition plan.   CSW contacted pt sister, Maudie Mercury via telephone. Pt sister confirmed agreement for return to [facility]. Pt sister discussed that pt was only at the facility for one day before having to be readmitted and is nervous that pt discharging too soon. Pt sister spoke with MD to have questions clarified.   CSW confirmed with [employer] that facility can accept pt back and facility can provide transport for pt to radiation treatments.   CSW to facilitate  pt discharge to [facility] this afternoon.    Employment status:  Disabled (Comment on whether or not currently receiving Disability) Insurance information:  Medicare, Medicaid In [place] PT Recommendations:  [place] / Referral to community resources:  [place]  Patient/Family's Response to care:  Pt alert and oriented x 4, but wants siblings involved in disposition planning. All are in agreement to return to [facility]. Questions and concerned addressed.   Patient/Family's Understanding of and Emotional Response to Diagnosis, Current Treatment, and Prognosis:  Pt sister is a nurse and knowledgeable about pt diagnosis and treatment recommendations. Pt sister is nervous that pt is being discharged too soon given pt recently at [facility], discharged and then readmitted within one day.   Emotional Assessment Appearance:  Appears stated age Attitude/Demeanor/Rapport:  Other (cooperative, pleasant) Affect (typically observed):  Accepting, Appropriate Orientation:  Oriented to Self, Oriented to Place, Oriented to  Time, Oriented to Situation Alcohol / Substance use:  Not Applicable Psych involvement (Current and /or in the community):  No (Comment)  Discharge Needs  Concerns to be addressed:  Discharge Planning Concerns Readmission within the last 30 days:  No Current discharge risk:  None Barriers to Discharge:  No Barriers Identified   [place], [place], LCSW 02/07/2016, 5:05 PM  [phone]

## 2016-02-07 NOTE — NC FL2 (Deleted)
[place] LEVEL OF CARE SCREENING TOOL     IDENTIFICATION  Patient Name: Chad Wang Birthdate: 1956/07/20 Sex: male Admission Date (Current Location): 01/31/2016  [place] and [place] Number:  Kathleen Argue [id number] [place] and Address:  [facility] [place],  [place] [address], [place]      Provider Number: [id number]  Attending Physician Name and Address:  Debbe Odea, MD  Relative Name and Phone Number:       Current Level of Care: Hospital Recommended Level of Care: [place] Prior Approval Number:    Date Approved/Denied:   PASRR Number:    Discharge Plan: SNF    Current Diagnoses: Patient Active Problem List   Diagnosis Date Noted  . Acute respiratory failure ([place]) 02/01/2016  . Pressure ulcer 02/01/2016  . Acute respiratory failure with hypoxemia ([place])   . 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 Back pain   . Laryngeal mass   . Malnutrition of moderate degree 01/19/2016  . Primary cancer of left upper lobe of lung ([place]) 01/19/2016  . Essential hypertension 01/19/2016  . Pulmonary emphysema ([place])   . Community acquired pneumonia 01/18/2016  . Hyponatremia 01/18/2016  . DM type 2 (diabetes mellitus, type 2) ([place]) 01/18/2016  . Tobacco abuse 01/18/2016    Orientation RESPIRATION BLADDER Height & Weight     Self, Time, Situation, Place  O2 (6L) Incontinent, External catheter Weight: 149 lb 0.5 oz (67.6 kg) Height:  '5\' 10"'$  (177.8 cm)  BEHAVIORAL SYMPTOMS/MOOD NEUROLOGICAL BOWEL NUTRITION STATUS  Other (Comment) (no behaviors)  (NONE) Continent Diet (diet carb modified)  AMBULATORY STATUS COMMUNICATION OF NEEDS Skin   Extensive Assist Verbally PU Stage and Appropriate Care (Stage 1 to mid sacrum) PU Stage 1 Dressing: Daily (Foam Dressing every 3 days)                     Personal Care Assistance Level of Assistance  Bathing, Feeding, Dressing Bathing Assistance: Limited assistance Feeding assistance:  Independent Dressing Assistance: Limited assistance Total Care Assistance:  (min assist for transfers)   Functional Limitations Info  Sight, Hearing, Speech Sight Info: Adequate Hearing Info: Adequate Speech Info: Adequate    SPECIAL CARE FACTORS FREQUENCY  PT (By licensed PT)     PT Frequency: 5 x a week              Contractures Contractures Info: Not present    Additional Factors Info  Code Status, Allergies, Psychotropic, Insulin Sliding Scale Code Status Info: FULL code status Allergies Info: No Known Allergies Psychotropic Info: Depakote Insulin Sliding Scale Info: novolog 4 x a day       Current Medications (02/07/2016):  This is the current hospital active medication list Current Facility-Administered Medications  Medication Dose Route Frequency Provider Last Rate Last Dose  . albuterol (PROVENTIL) (2.5 MG/3ML) 0.083% nebulizer solution 2.5 mg  2.5 mg Nebulization TID Lucious Groves, DO   2.5 mg at 02/07/16 0925  . albuterol (PROVENTIL) (2.5 MG/3ML) 0.083% nebulizer solution 2.5 mg  2.5 mg Nebulization Q4H PRN Juanito Doom, MD   2.5 mg at 02/03/16 0339  . amoxicillin-clavulanate (AUGMENTIN) 875-125 MG per tablet 1 tablet  1 tablet Oral Q12H Debbe Odea, MD   1 tablet at 02/07/16 1029  . antiseptic oral rinse (CPC / CETYLPYRIDINIUM CHLORIDE 0.05%) solution 7 mL  7 mL Mouth Rinse q12n4p Juanito Doom, MD   7 mL at 02/06/16 1433  . cloZAPine (CLOZARIL) tablet 100 mg  100  mg Oral QHS Francesca Oman, DO   100 mg at 02/06/16 2221  . diclofenac sodium (VOLTAREN) 1 % transdermal gel 4 g  4 g Topical QID Debbe Odea, MD   4 g at 02/07/16 1031  . divalproex (DEPAKOTE) DR tablet 500 mg  500 mg Oral q morning - 10a Juanito Doom, MD   500 mg at 02/07/16 1030   And  . divalproex (DEPAKOTE) DR tablet 1,000 mg  1,000 mg Oral QHS Juanito Doom, MD   1,000 mg at 02/06/16 2221  . feeding supplement (ENSURE ENLIVE) (ENSURE ENLIVE) liquid 237 mL  237 mL Oral TID BM  Juanito Doom, MD   237 mL at 02/06/16 1433  . heparin injection 5,000 Units  5,000 Units Subcutaneous 3 times per day Javier Glazier, MD   5,000 Units at 02/07/16 0654  . insulin aspart (novoLOG) injection 0-15 Units  0-15 Units Subcutaneous TID WC [name], MD   2 Units at 02/07/16 0854  . insulin aspart (novoLOG) injection 0-5 Units  0-5 Units Subcutaneous QHS [name], MD   0 Units at 02/02/16 2200  . pantoprazole (PROTONIX) EC tablet 40 mg  40 mg Oral Q1200 [name], MD   40 mg at 02/06/16 1421     Discharge Medications: Please see discharge summary for a list of discharge medications.  Relevant Imaging Results:  Relevant Lab Results:   Additional Information SSN: [id number]. Pt currently undergoing radiation treatment and will be a candidate for chemoradiation if he remains stable in the near future. Pt final radiation treatment scheduled for 02/16/16. Chemotherapy schedule not yet determined.  KIDD, SUZANNA A, LCSW

## 2016-02-07 NOTE — Progress Notes (Signed)
PT Cancellation Note  Patient Details Name: LONN IM MRN: [id number] DOB: 11/19/1955   Cancelled Treatment:    Reason Eval/Treat Not Completed: Other (comment) (RRT and MD with patient presently. will check back later  for evaluation.)   Claretha Cooper 02/07/2016, 9:27 AM Tresa Endo PT [phone]

## 2016-02-07 NOTE — Discharge Summary (Signed)
Physician Discharge Summary  Chad Wang UXL:[id number] DOB: [birth year] DOA: 01/31/2016  PCP: No PCP Per Patient  Admit date: 01/31/2016 Discharge date: 02/07/2016  Time spent: 60 minutes  Recommendations for Outpatient Follow-up:  1. Recommend repeat CXR in 1 wk to assess left lung collapse- can f/u with [facility] 2. Needs transportation to Cancer center for radiation 3. Cont O2 at 4 L- wean as able  Discharge Condition: stable    Discharge Diagnoses:  Principal Problem:   Acute respiratory failure with hypoxemia ([place]) Active Problems:   Squamous cell lung and laryngeal cancer    Hyponatremia   DM type 2 (diabetes mellitus, type 2) (H[facility])   Essential hypertension   Pressure ulcer   COPD (chronic obstructive pulmonary disease) ([place])   Schizophrenia ([place])   History of present illness:  Chad Wang is a 60 y.o. male with a h/o HTN, DM, schizophrenia, smoker admitted on 3/22 with hoarseness, cough with yellow sputum, weight loss, poor oral intake and malaise.  CTA of the chest was negative for PE - revealed collapse of LLL with abrupt termination of LLL bronchus suspicious for endobronchial lesion. He also had masslike consolidation of the LUL with adjacent pleural effusion.. Bronch performed by pulmonary team on 3/24 showed a glottic mass and a left mainstem mass and the procedure was aborted. ENT was consulted and he underwent a microlaryngoscopy with debulking of laryngeal mass on 3/29. 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 Treated for pneumonia and discharged on 4/3.  Readmitted on 4/4 for dyspnea and CXR showing complete opacification of the left hemithorax thought to be postobstructive PNA and intubated. He was still taking oral Levaquin.  Bronch on 4/6 with biopsy- showed sq cell cancer.   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 - radiation started on 4/7 to lung mass in attempt to open up lung collapse - medical oncology, Dr  Alen Blew plans chemo for which he will need a port- he is making the arrangements  Acute hypoxic respiratory failure - left post obstructive PNA due to left endobronchial lesion and right middle and lower lobe infiltrates - on Zosyn (vanc stopped on 4/9), MRSA PCR negative - PCCM managing- last dose of Zosyn 5 AM yesterday- Dr Elsworth Soho on 4/10 who states OK to switch to Augmentin for 14 days- depending on xray findings after radiation, antibiotic course may be extended  - on 4 L - 97% on room air- able to get up to bedside commode without trouble  DM 2 - on Metformin at home - sliding scale here - sugars well controlled without insulin  Schizophrenia - Depakote, Clozapine  HTN - meds on hold - normotensive  COPD- smoker - no exacerbation- quit smoking  Back pain - OOB, voltaren gel  Procedures: 3/24 Bronchoscopy >> supraglottic mass, Lt mainstem bronchus mass 3/30 Laryngoscopy with debulking of laryngeal mass >> Squamous cell carcinoma 4/4 intubated 4/5 bronchoscopy w/biopsy of mass obstructing left main bronchus 4/5 self extubated   MICROBIOLOGY: Urine Ctx 4/5: Insignificant growth Blood Ctx x2 4/4>>> Coag Neg Staph 1/2 bottles, likely contaminant MRSA PCR 4/5: Negative   Consultations:  PCCM  Discharge Exam: Filed Weights   02/05/16 2100 02/06/16 0500 02/07/16 0528  Weight: 69.6 kg (153 lb 7 oz) 66.6 kg (146 lb 13.2 oz) 67.6 kg (149 lb 0.5 oz)   Filed Vitals:   02/06/16 2059 02/07/16 0528  BP: 118/72 108/73  Pulse: 107 103  Temp: 98.1 F (36.7 C) 97.7 F (  36.5 C)  Resp: 18 18    General: AAO x 3, no distress Cardiovascular: RRR, no murmurs  Respiratory: clear to auscultation in right lung field- left side has decreased breath sounds GI: soft, non-tender, non-distended, bowel sound positive  Discharge Instructions You were cared for by a hospitalist during your hospital stay. If you have any questions about your discharge medications or the care you  received while you were in the hospital after you are discharged, you can call the unit and asked to speak with the hospitalist on call if the hospitalist that took care of you is not available. Once you are discharged, your primary care physician will handle any further medical issues. Please note that NO REFILLS for any discharge medications will be authorized once you are discharged, as it is imperative that you return to your primary care physician (or establish a relationship with a primary care physician if you do not have one) for your aftercare needs so that they can reassess your need for medications and monitor your lab values.      Discharge Instructions    Increase activity slowly    Complete by:  As directed             Medication List    STOP taking these medications        amLODipine 10 MG tablet  Commonly known as:  NORVASC     neomycin-polymyxin-hydrocortisone otic solution  Commonly known as:  CORTISPORIN     nicotine 14 mg/24hr patch  Commonly known as:  NICODERM CQ - dosed in mg/24 hours     omeprazole 20 MG capsule  Commonly known as:  PRILOSEC      TAKE these medications        albuterol 108 (90 Base) MCG/ACT inhaler  Commonly known as:  PROVENTIL HFA;VENTOLIN HFA  Inhale 2 puffs into the lungs every 6 (six) hours as needed for wheezing or shortness of breath.     amoxicillin-clavulanate 875-125 MG tablet  Commonly known as:  AUGMENTIN  Take 1 tablet by mouth every 12 (twelve) hours.     chlorpheniramine-HYDROcodone 10-8 MG/5ML Suer  Commonly known as:  TUSSIONEX  Take 5 mLs by mouth every 12 (twelve) hours.     cloZAPine 100 MG tablet  Commonly known as:  CLOZARIL  Take 100 mg by mouth at bedtime.     divalproex 500 MG DR tablet  Commonly known as:  DEPAKOTE  Take 500-1,000 mg by mouth 2 (two) times daily. Takes '500mg'$  in am and '1000mg'$  in pm     docusate sodium 100 MG capsule  Commonly known as:  COLACE  Take 1 capsule (100 mg total) by mouth  2 (two) times daily.     feeding supplement (GLUCERNA SHAKE) Liqd  Take 237 mLs by mouth 2 (two) times daily between meals.     gemfibrozil 600 MG tablet  Commonly known as:  LOPID  Take 600 mg by mouth 2 (two) times daily.     metFORMIN 1000 MG tablet  Commonly known as:  GLUCOPHAGE  Take 500 mg by mouth 2 (two) times daily with a meal.     mometasone-formoterol 200-5 MCG/ACT Aero  Commonly known as:  DULERA  Inhale 2 puffs into the lungs 2 (two) times daily.       No Known Allergies    The results of significant diagnostics from this hospitalization (including imaging, microbiology, ancillary and laboratory) are listed below for reference.    Significant Diagnostic Studies:  Dg Chest 2 View  01/09/2016  CLINICAL DATA:  Gradual onset productive cough.  Current smoker. EXAM: CHEST  2 VIEW COMPARISON:  12/13/2012 chest radiograph. FINDINGS: Normal heart size. Mildly tortuous thoracic aorta. Questionable mild thickening of the right paratracheal stripe. No pneumothorax. No pleural effusion. There is a new focal masslike opacity in the posterior left upper lung. There is new hyperlucency throughout the left lung. No pulmonary edema. IMPRESSION: 1. New focal masslike opacity in the posterior left upper lung, cannot exclude pulmonary neoplasm. 2. Questionable mild thickening of the right paratracheal stripe, adenopathy not excluded. 3. Recommend further evaluation with chest CT with IV contrast. Electronically Signed   By: Ilona Sorrel M.D.   On: 01/09/2016 12:33   Ct Soft Tissue Neck W Contrast  01/20/2016  CLINICAL DATA:  60 year old male with hoarseness since February. Laryngeal mass. Subsequent encounter. EXAM: CT NECK WITH CONTRAST TECHNIQUE: Multidetector CT imaging of the neck was performed using the standard protocol following the bolus administration of intravenous contrast. CONTRAST:  53m OMNIPAQUE IOHEXOL 300 MG/ML  SOLN COMPARISON:  Head CT without contrast 12/13/2012. Chest CTA  01/18/2016. 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 The lesion encompasses 18 x 17 x 23 mm (AP by transverse by CC). See series 21, image 63 and sagittal image 66. The right aryepiglottic fold appears invaded on series 201, image 65. The false cords appear relatively spared. The true cords are unaffected. The tumor does not involve the laryngeal cartilages but does track into the pre epiglottic fat. Pharyngeal soft tissue contours are within normal limits. Negative parapharyngeal and retropharyngeal spaces. Salivary glands: Negative sublingual space, submandibular glands and parotid glands. Thyroid: Negative. Lymph nodes: Right side level 2 and level 3 lymph nodes are small, measuring 5-6 mm short axis, but are larger than those on the left. Compare sagittal images 44 and 45 on the right to images 87 and 88 on the left. There is also a conspicuous right level 4 lymph node measuring 7 mm short axis on series 21, image 89. No no larger nodes. Normal level 1 nodes and level 5 nodes. No cystic or necrotic nodes. Vascular: Major vascular structures in the neck and at the skullbase are patent. Calcified atherosclerosis at the skull base. Limited intracranial: Negative. Visualized orbits: Negative. Mastoids and visualized paranasal sinuses: Clear. Skeleton: Scattered poor dentition. Lower cervical spine osseous disc and endplate degeneration. No acute osseous abnormality identified. Upper chest: Stable from recent chest CTA (please see that report). IMPRESSION: 1. 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 Invasion of the right AE fold suspected. No laryngeal cartilage or paralaryngeal soft tissue involvement. 2. Small but asymmetrically conspicuous right level 2 through level 4 lymph nodes, up to 7 mm short axis.  Early nodal metastatic disease not excluded. 3. Abnormal upper chest as reported on 01/18/2016 (please see that report). 4. Scattered poor dentition. Electronically Signed   By: HGenevie AnnM.D.   On: 01/20/2016 16:38   Ct Angio Chest Pe W/cm &/or Wo Cm  01/18/2016  CLINICAL DATA:  Cough and shortness of breath for many months. Nodular opacity seen in left upper lung on chest radiograph. EXAM: CT ANGIOGRAPHY CHEST WITH CONTRAST TECHNIQUE: Multidetector CT imaging of the chest was performed using the standard protocol during bolus administration of intravenous contrast. Multiplanar CT image reconstructions and  MIPs were obtained to evaluate the vascular anatomy. CONTRAST:  55m OMNIPAQUE IOHEXOL 350 MG/ML SOLN COMPARISON:  Chest 01/09/2016 FINDINGS: Technically adequate study with good opacification of the central and segmental pulmonary arteries. No focal filling defects are demonstrated. No evidence of significant pulmonary embolus. Normal heart size. Mildly dilated esophagus with small air-fluid level. This may be due to dysmotility disorder. Prominent mediastinal lymph nodes with mild enlargement of a right paratracheal lymph node, measuring 16 mm short axis dimension. This is nonspecific in could be reactive or metastatic. There is diffuse emphysematous change throughout the lungs but more prominent in the left lung. Alveolar infiltrates in the right lung likely represent pneumonia but could indicate asymmetric edema. There is an abrupt termination to the left lower lobe bronchus with complete collapse of the left lower lung. This suggest an endobronchial obstructing lesion. Bronchoscopy may be useful for further evaluation. Masslike consolidation in the left upper lung with associated left pleural effusion and tenting of the pleura. This measures about 3.8 cm maximal diameter. Appearance is suspicious for a mass lesion although a focal consolidation could potentially have this appearance. No pneumothorax.  Included portions of the upper abdominal organs are grossly unremarkable. Degenerative changes in the spine. No destructive bone lesions. Review of the MIP images confirms the above findings. IMPRESSION: 1. No evidence of significant pulmonary embolus. 2. Diffuse emphysematous changes in the lungs. Diffuse alveolar infiltration in the right lung is probably due to pneumonia but asymmetric edema could also have this appearance. 3. Collapse of the left lower lung with abrupt termination of the left lower lobe bronchus, suspicious for endobronchial lesion. Consider bronchoscopy for further evaluation. 4. Masslike consolidation in the left upper lung with adjacent pleural effusion and tenting. This is highly suspicious for a mass lesion. 5. Esophageal dilatation likely due to dysmotility although distal stricture not excluded. Electronically Signed   By: WLucienne CapersM.D.   On: 01/18/2016 21:27   Mr Thoracic Spine Wo Contrast  01/27/2016  CLINICAL DATA:  Back pain. EXAM: MRI THORACIC AND LUMBAR SPINE WITHOUT CONTRAST TECHNIQUE: Multiplanar and multiecho pulse sequences of the thoracic and lumbar spine were obtained without intravenous contrast. COMPARISON:  Chest CTA 01/18/2016. FINDINGS: MR THORACIC SPINE FINDINGS Vertebral alignment is normal. Multiple Schmorl's node deformities are again seen throughout the mid and lower thoracic spine. No significant vertebral marrow edema is seen. No destructive osseous lesion is identified on this unenhanced study. The thoracic spinal cord is normal in caliber and signal. Left lower lobe collapse is more fully evaluated on recent CT. There is a small left pleural effusion. Mild disc bulging is present at T2-3 and T3-4 without stenosis. Right-sided anterior endplate spurring is present throughout the thoracic spine. There is asymmetric, mild to moderate left facet arthrosis at T6-7 without evidence of neural impingement. MR LUMBAR SPINE FINDINGS Numbering is continued from  the thoracic spine, counting down from the craniocervical junction. The conus medullaris terminates at the inferior aspect of L1 on the thoracic study. The yields 6 lumbar type vertebral bodies, and the lowest will be considered a lumbarized S1. Vertebral alignment is normal. Schmorl's nodes are noted at L1-2. No significant vertebral marrow edema is seen. Disc desiccation is present from L1-2-L5-S1. Mild type 2 degenerative endplate changes are present at L2-3 with anterior endplate osteophytosis noted. There is mild disc space narrowing at L1-2 greater than L2-3, and there is at most minimal narrowing L5-S1. Paraspinal soft tissues are unremarkable. The cecum and ascending colon are prominent in size  and likely contain a large amount of stool. L1-2 through L4-5: No disc herniation or stenosis. L5-S1: Mild disc bulging, shallow right subarticular disc protrusion, and moderate facet arthrosis result in mild right lateral recess stenosis. No spinal canal or neural foraminal stenosis. S1-2: Mild right and moderate left facet arthrosis without disc herniation or stenosis. IMPRESSION: MR THORACIC SPINE IMPRESSION 1. Mild thoracic spondylosis without stenosis. 2. Multiple chronic Schmorl's node deformities. MR LUMBAR SPINE IMPRESSION 1. Transitional lumbosacral anatomy as above. 2. Mild lumbar spondylosis and moderate lower lumbar facet arthrosis. Mild right lateral recess stenosis at L4-5. Electronically Signed   By: Logan Bores M.D.   On: 01/27/2016 18:50   Mr Lumbar Spine Wo Contrast  01/27/2016  CLINICAL DATA:  Back pain. EXAM: MRI THORACIC AND LUMBAR SPINE WITHOUT CONTRAST TECHNIQUE: Multiplanar and multiecho pulse sequences of the thoracic and lumbar spine were obtained without intravenous contrast. COMPARISON:  Chest CTA 01/18/2016. FINDINGS: MR THORACIC SPINE FINDINGS Vertebral alignment is normal. Multiple Schmorl's node deformities are again seen throughout the mid and lower thoracic spine. No significant  vertebral marrow edema is seen. No destructive osseous lesion is identified on this unenhanced study. The thoracic spinal cord is normal in caliber and signal. Left lower lobe collapse is more fully evaluated on recent CT. There is a small left pleural effusion. Mild disc bulging is present at T2-3 and T3-4 without stenosis. Right-sided anterior endplate spurring is present throughout the thoracic spine. There is asymmetric, mild to moderate left facet arthrosis at T6-7 without evidence of neural impingement. MR LUMBAR SPINE FINDINGS Numbering is continued from the thoracic spine, counting down from the craniocervical junction. The conus medullaris terminates at the inferior aspect of L1 on the thoracic study. The yields 6 lumbar type vertebral bodies, and the lowest will be considered a lumbarized S1. Vertebral alignment is normal. Schmorl's nodes are noted at L1-2. No significant vertebral marrow edema is seen. Disc desiccation is present from L1-2-L5-S1. Mild type 2 degenerative endplate changes are present at L2-3 with anterior endplate osteophytosis noted. There is mild disc space narrowing at L1-2 greater than L2-3, and there is at most minimal narrowing L5-S1. Paraspinal soft tissues are unremarkable. The cecum and ascending colon are prominent in size and likely contain a large amount of stool. L1-2 through L4-5: No disc herniation or stenosis. L5-S1: Mild disc bulging, shallow right subarticular disc protrusion, and moderate facet arthrosis result in mild right lateral recess stenosis. No spinal canal or neural foraminal stenosis. S1-2: Mild right and moderate left facet arthrosis without disc herniation or stenosis. IMPRESSION: MR THORACIC SPINE IMPRESSION 1. Mild thoracic spondylosis without stenosis. 2. Multiple chronic Schmorl's node deformities. MR LUMBAR SPINE IMPRESSION 1. Transitional lumbosacral anatomy as above. 2. Mild lumbar spondylosis and moderate lower lumbar facet arthrosis. Mild right  lateral recess stenosis at L4-5. Electronically Signed   By: Logan Bores M.D.   On: 01/27/2016 18:50   Dg Chest Port 1 View  02/04/2016  CLINICAL DATA:  Acute respiratory failure EXAM: PORTABLE CHEST 1 VIEW COMPARISON:  February 02, 2016 FINDINGS: The left hemithorax is completely opacified. The infiltrate in the right lung base has improved in the interval. No pneumothorax. No other interval changes. IMPRESSION: 1. Stable complete opacification of left hemi thorax. 2. Improving right basilar infiltrate. Recommend follow-up to complete resolution. Electronically Signed   By: Dorise Bullion III M.D   On: 02/04/2016 07:09   Dg Chest Port 1 View  02/02/2016  CLINICAL DATA:  Respiratory failure.  Shortness  breath. EXAM: PORTABLE CHEST 1 VIEW COMPARISON:  02/01/2016. FINDINGS: Interim extubation removal of NG tube. Opacification of the left hemithorax again noted.Persistent infiltrate right lower lobe. Heart size cannot be evaluated. Pulmonary vascularity and the right is normal. No pneumothorax. IMPRESSION: 1. Interim removal of endotracheal tube and NG tube. 2. Persistent opacification of the left hemi thorax. Persistent right lower lobe infiltrate. No interim change. Electronically Signed   By: Marcello Moores  Register   On: 02/02/2016 07:06   Dg Chest Port 1 View  02/01/2016  CLINICAL DATA:  Hypoxia EXAM: PORTABLE CHEST 1 VIEW COMPARISON:  Study obtained earlier in the day ; chest CT January 18, 2016 FINDINGS: Endotracheal tube tip is 5.3 cm above the carina. No pneumothorax. Complete opacification of the left hemithorax remains. There is increase in airspace consolidation throughout the right lower lobe. Heart is upper normal in size with pulmonary vascularity on the right within normal limits. Pulmonary vascularity on the left is obscured. Nasogastric tube tip and side port are in the stomach. IMPRESSION: Tube positions as described without pneumothorax. Complete opacification of the left hemithorax remains, likely due  to effusion and consolidation in concert. Airspace consolidation throughout the right lower lobe has increased compared to earlier in the day. No change in cardiac silhouette apparent. Electronically Signed   By: Lowella Grip III M.D.   On: 02/01/2016 12:46   Dg Chest Port 1 View  02/01/2016  CLINICAL DATA:  Respiratory failure.  Hypoxemia.  Followup exam. EXAM: PORTABLE CHEST 1 VIEW COMPARISON:  01/31/2016 FINDINGS: Left hemi thorax remains completely opacified. The left-sided volume loss is stable. Airspace opacification noted in the right mid to lower lung zone has improved. Given the rapid improvement, at this is likely asymmetric edema. No new lung opacification. No right pleural effusion. No pneumothorax. IMPRESSION: 1. Improved right lung aeration with decreased right mid and lower lung zone opacity most consistent with improved pulmonary edema. 2. Persistent left hemi thorax opacification. Electronically Signed   By: Lajean Manes M.D.   On: 02/01/2016 11:10   Dg Chest Port 1 View  01/31/2016  CLINICAL DATA:  Dyspnea. Discharge from hospital yesterday with recent diagnosis of lung mass. EXAM: PORTABLE CHEST 1 VIEW COMPARISON:  Chest CT 01/18/2016 FINDINGS: Complete opacification of the left hemithorax with volume loss, question of tiny aerated portion of left upper lobe. There is leftward tracheal deviation. Cardiac in mediastinal contours are obscured. Progressive opacity in the right mid and lower lung zone compared to prior exam, worsening pneumonia, edema, or less likely neoplasm. No evidence of pneumothorax. IMPRESSION: 1. Complete opacification of the left hemithorax with volume loss and tracheal deviation, consistent with multilobar collapse/atelectasis. Patient with known endobronchial lesion and left lung mass. 2. Worsening opacity in the right and mid lower lung zone, asymmetric pulmonary edema, infection, or less likely spread of malignancy. Electronically Signed   By: Jeb Levering  M.D.   On: 01/31/2016 23:33   Dg Abd Portable 1v  02/01/2016  CLINICAL DATA:  Orogastric tube placement. Lung cancer. Acute respiratory failure with hypoxemia. EXAM: PORTABLE ABDOMEN - 1 VIEW COMPARISON:  None. FINDINGS: Orogastric tube is seen with tip overlies the gastric fundus. Gas-filled nondilated small bowel loops are noted as well as a large amount of stool throughout the colon. Opacification of left lung base also noted. IMPRESSION: Orogastric tube tip overlies the gastric fundus. Large colonic stool burden noted. Electronically Signed   By: Earle Gell M.D.   On: 02/01/2016 12:49    Microbiology: Recent  Results (from the past 240 hour(s))  Culture, blood (routine x 2)     Status: None   Collection Time: 01/31/16 11:20 PM  Result Value Ref Range Status   Specimen Description BLOOD RIGHT HAND  Final   Special Requests BOTTLES DRAWN AEROBIC AND ANAEROBIC 5ML  Final   Culture NO GROWTH 5 DAYS  Final   Report Status 02/06/2016 FINAL  Final  Culture, blood (routine x 2)     Status: None   Collection Time: 01/31/16 11:25 PM  Result Value Ref Range Status   Specimen Description BLOOD LEFT FOREARM  Final   Special Requests IN PEDIATRIC BOTTLE 10ML  Final   Culture  Setup Time   Final    GRAM POSITIVE COCCI IN CLUSTERS AEROBIC BOTTLE ONLY CRITICAL RESULT CALLED TO, READ BACK BY AND VERIFIED WITH: K MCCARTY RN [id number] 02/01/16 A BROWNING    Culture   Final    STAPHYLOCOCCUS SPECIES (COAGULASE NEGATIVE) THE SIGNIFICANCE OF ISOLATING THIS ORGANISM FROM A SINGLE SET OF BLOOD CULTURES WHEN MULTIPLE SETS ARE DRAWN IS UNCERTAIN. PLEASE NOTIFY THE MICROBIOLOGY DEPARTMENT WITHIN ONE WEEK IF SPECIATION AND SENSITIVITIES ARE REQUIRED.    Report Status 02/03/2016 FINAL  Final  Urine culture     Status: Abnormal   Collection Time: 02/01/16  1:13 AM  Result Value Ref Range Status   Specimen Description URINE, CLEAN CATCH  Final   Special Requests NONE  Final   Culture 2,000 COLONIES/mL INSIGNIFICANT  GROWTH (A)  Final   Report Status 02/02/2016 FINAL  Final  MRSA PCR Screening     Status: None   Collection Time: 02/01/16  3:51 AM  Result Value Ref Range Status   MRSA by PCR NEGATIVE NEGATIVE Final    Comment:        The GeneXpert MRSA Assay (FDA approved for NASAL specimens only), is one component of a comprehensive MRSA colonization surveillance program. It is not intended to diagnose MRSA infection nor to guide or monitor treatment for MRSA infections.      Labs: Basic Metabolic Panel:  Recent Labs Lab 02/02/16 0349 02/03/16 0318 02/04/16 0346 02/05/16 0331 02/06/16 0540  NA 138 143 134* 137 137  K 4.2 4.3 4.4 4.1 4.3  CL 101 107 102 102 104  CO2 '26 25 26 25 25  '$ GLUCOSE 162* 172* 157* 114* 148*  BUN 17 26* '19 14 15  '$ CREATININE 0.61 0.69 0.71 0.62 0.53*  CALCIUM 9.1 9.1 8.6* 8.9 8.8*  MG  --   --   --  2.0 2.0  PHOS  --   --   --  3.4  --    Liver Function Tests:  Recent Labs Lab 01/31/16 2242 02/05/16 0331  AST 39  --   ALT 14*  --   ALKPHOS 66  --   BILITOT 1.2  --   PROT 5.5*  --   ALBUMIN 2.8* 3.1*    Recent Labs Lab 01/31/16 2242  LIPASE 17   No results for input(s): AMMONIA in the last 168 hours. CBC:  Recent Labs Lab 01/31/16 2242 02/01/16 0306 02/02/16 0349 02/03/16 0318 02/04/16 0346 02/05/16 0331  WBC 23.5* 20.0* 23.8* 17.5* 9.8 9.6  NEUTROABS 20.9* 18.9*  --   --   --  6.8  HGB 13.4 13.5 13.2 13.1 12.8* 13.7  HCT 38.7* 39.0 38.0* 37.4* 37.2* 40.1  MCV 81.1 81.3 82.1 82.7 83.2 82.0  PLT 160 147* 198 230 216 224   Cardiac Enzymes: No results for  input(s): CKTOTAL, CKMB, CKMBINDEX, TROPONINI in the last 168 hours. BNP: BNP (last 3 results)  Recent Labs  01/18/16 1634 01/31/16 2242  BNP 24.5 69.8    ProBNP (last 3 results) No results for input(s): PROBNP in the last 8760 hours.  CBG:  Recent Labs Lab 02/06/16 1413 02/06/16 1739 02/06/16 2206 02/07/16 0754 02/07/16 1137  GLUCAP 175* 121* 140* 130* 115*        SignedDebbe Odea, MD [facility] 02/07/2016, 2:39 PM

## 2016-02-07 NOTE — Care Management Important Message (Signed)
Important Message  Patient Details  Name: Chad Wang MRN: [id number] Date of Birth: 1956/04/07   Medicare Important Message Given:  Yes    Camillo Flaming 02/07/2016, 10:09 AMImportant Message  Patient Details  Name: Chad Wang MRN: [id number] Date of Birth: 09/21/1956   Medicare Important Message Given:  Yes    Camillo Flaming 02/07/2016, 10:09 AM

## 2016-02-07 NOTE — NC FL2 (Signed)
[place] LEVEL OF CARE SCREENING TOOL     IDENTIFICATION  Patient Name: Chad Wang Birthdate: 08-24-1956 Sex: male Admission Date (Current Location): 01/31/2016  [place] and [place] Number:  Kathleen Argue [id number] [place] and Address:  [facility],  [place] [address] [place]      Provider Number: [id number]  Attending Physician Name and Address:  Debbe Odea, MD  Relative Name and Phone Number:       Current Level of Care: Hospital Recommended Level of Care: [place] Prior Approval Number:    Date Approved/Denied:   PASRR Number: [id number]  Discharge Plan: SNF    Current Diagnoses: Patient Active Problem List   Diagnosis Date Noted  . Acute respiratory failure ([place]) 02/01/2016  . Pressure ulcer 02/01/2016  . Acute respiratory failure with hypoxemia ([place])   . 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 Back pain   . Laryngeal mass   . Malnutrition of moderate degree 01/19/2016  . Primary cancer of left upper lobe of lung ([place]) 01/19/2016  . Essential hypertension 01/19/2016  . Pulmonary emphysema ([place])   . Community acquired pneumonia 01/18/2016  . Hyponatremia 01/18/2016  . DM type 2 (diabetes mellitus, type 2) ([place]) 01/18/2016  . Tobacco abuse 01/18/2016    Orientation RESPIRATION BLADDER Height & Weight     Self, Time, Situation, Place  O2 (6L) Incontinent, External catheter Weight: 149 lb 0.5 oz (67.6 kg) Height:  '5\' 10"'$  (177.8 cm)  BEHAVIORAL SYMPTOMS/MOOD NEUROLOGICAL BOWEL NUTRITION STATUS  Other (Comment) (no behaviors)  (NONE) Continent Diet (diet carb modified)  AMBULATORY STATUS COMMUNICATION OF NEEDS Skin   Extensive Assist Verbally PU Stage and Appropriate Care (Stage 1 to mid sacrum) PU Stage 1 Dressing: Daily (Foam Dressing every 3 days)                     Personal Care Assistance Level of Assistance  Bathing, Feeding, Dressing Bathing Assistance: Limited assistance Feeding  assistance: Independent Dressing Assistance: Limited assistance Total Care Assistance:  (min assist for transfers)   Functional Limitations Info  Sight, Hearing, Speech Sight Info: Adequate Hearing Info: Adequate Speech Info: Adequate    SPECIAL CARE FACTORS FREQUENCY  PT (By licensed PT)     PT Frequency: 5 x a week              Contractures Contractures Info: Not present    Additional Factors Info  Code Status, Allergies, Psychotropic, Insulin Sliding Scale Code Status Info: FULL code status Allergies Info: No Known Allergies Psychotropic Info: Depakote Insulin Sliding Scale Info: novolog 4 x a day       Current Medications (02/07/2016):  This is the current hospital active medication list Current Facility-Administered Medications  Medication Dose Route Frequency Provider Last Rate Last Dose  . albuterol (PROVENTIL) (2.5 MG/3ML) 0.083% nebulizer solution 2.5 mg  2.5 mg Nebulization TID Lucious Groves, DO   2.5 mg at 02/07/16 0925  . albuterol (PROVENTIL) (2.5 MG/3ML) 0.083% nebulizer solution 2.5 mg  2.5 mg Nebulization Q4H PRN Juanito Doom, MD   2.5 mg at 02/03/16 0339  . amoxicillin-clavulanate (AUGMENTIN) 875-125 MG per tablet 1 tablet  1 tablet Oral Q12H Debbe Odea, MD   1 tablet at 02/07/16 1029  . antiseptic oral rinse (CPC / CETYLPYRIDINIUM CHLORIDE 0.05%) solution 7 mL  7 mL Mouth Rinse q12n4p Juanito Doom, MD   7 mL at 02/06/16 1433  . cloZAPine (CLOZARIL) tablet 100 mg  100 mg  Oral QHS Francesca Oman, DO   100 mg at 02/06/16 2221  . diclofenac sodium (VOLTAREN) 1 % transdermal gel 4 g  4 g Topical QID Debbe Odea, MD   4 g at 02/07/16 1031  . divalproex (DEPAKOTE) DR tablet 500 mg  500 mg Oral q morning - 10a Juanito Doom, MD   500 mg at 02/07/16 1030   And  . divalproex (DEPAKOTE) DR tablet 1,000 mg  1,000 mg Oral QHS Juanito Doom, MD   1,000 mg at 02/06/16 2221  . feeding supplement (ENSURE ENLIVE) (ENSURE ENLIVE) liquid 237 mL  237 mL  Oral TID BM Juanito Doom, MD   237 mL at 02/06/16 1433  . heparin injection 5,000 Units  5,000 Units Subcutaneous 3 times per day Javier Glazier, MD   5,000 Units at 02/07/16 0654  . insulin aspart (novoLOG) injection 0-15 Units  0-15 Units Subcutaneous TID WC [name], MD   2 Units at 02/07/16 0854  . insulin aspart (novoLOG) injection 0-5 Units  0-5 Units Subcutaneous QHS [name], MD   0 Units at 02/02/16 2200  . pantoprazole (PROTONIX) EC tablet 40 mg  40 mg Oral Q1200 [name], MD   40 mg at 02/06/16 1421     Discharge Medications: Please see discharge summary for a list of discharge medications.  Relevant Imaging Results:  Relevant Lab Results:   Additional Information SSN: [id number]. Pt currently undergoing radiation treatment and will be a candidate for chemoradiation if he remains stable in the near future. Pt final radiation treatment scheduled for 02/16/16. Chemotherapy schedule not yet determined.  KIDD, SUZANNA A, LCSW

## 2016-02-07 NOTE — Progress Notes (Signed)
Pt for discharge to [facility].  CSW facilitated pt discharge needs including contacting facility, faxing pt discharge information via epic hub, discussing with pt and pt sister and pt brother at bedside, providing RN phone number to call report, and arranging ambulance transport for pt back to [facility].   Support provided to pt and pt family as they felt pt may be being discharged prematurely, but hopeful that pt will progress at SNF.   No further social work needs identified at this time.  CSW signing off.   Alison Murray, MSW, [place] Work [phone]

## 2016-02-07 NOTE — Progress Notes (Signed)
Report called to [facility] healthcare to [place], [place] who will receive patient on arrival to facility. Patient stable from AM assessment.  Patient will be transported via PTAR to facility.

## 2016-02-08 ENCOUNTER — Ambulatory Visit
Admit: 2016-02-08 | Discharge: 2016-02-08 | Disposition: A | Discharge status: Home / Self Care | Payer: Medicare Other | Attending: Radiation Oncology | Admitting: Radiation Oncology

## 2016-02-08 ENCOUNTER — Telehealth: Payer: Self-pay | Admitting: Radiation Oncology

## 2016-02-08 DIAGNOSIS — Z510 Encounter for antineoplastic radiation therapy: Secondary | ICD-10-CM | POA: Diagnosis not present

## 2016-02-08 DIAGNOSIS — F17200 Nicotine dependence, unspecified, uncomplicated: Secondary | ICD-10-CM | POA: Diagnosis not present

## 2016-02-08 DIAGNOSIS — C3412 Malignant neoplasm of upper lobe, left bronchus or lung: Secondary | ICD-10-CM | POA: Diagnosis present

## 2016-02-08 NOTE — Telephone Encounter (Signed)
Patient discharged to [facility]. Spoke with Tammy Donovon to confirm transportation has been arranged for 0955 treatment appointment today. She confirms it has and patient is already in route. Reminded her the calendar includes a date for CT/SIM this Friday.Faxed treatment calendar to her attention at [phone]. Confirmation fax of delivery obtained. Informed Heather, RT on L3 of this finding.

## 2016-02-09 ENCOUNTER — Ambulatory Visit
Admit: 2016-02-09 | Discharge: 2016-02-09 | Disposition: A | Discharge status: Home / Self Care | Payer: Medicare Other | Attending: Radiation Oncology | Admitting: Radiation Oncology

## 2016-02-09 ENCOUNTER — Encounter: Payer: Self-pay | Admitting: *Deleted

## 2016-02-09 DIAGNOSIS — Z510 Encounter for antineoplastic radiation therapy: Secondary | ICD-10-CM | POA: Diagnosis not present

## 2016-02-09 NOTE — Progress Notes (Signed)
  Oncology Nurse Navigator Documentation  Navigator Location: [facility] Onc (02/09/16 1245) Navigator Encounter Type: Treatment (02/09/16 1245)           Patient Visit Type: PFYTWK (02/09/16 1245) Treatment Phase: Active Tx (02/09/16 1245)     In follow-up to email from Dr. Alen Blew, met with Mr. Teare after he completed XRT to introduce myself.  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  He voiced understanding.  Gayleen Orem, RN, BSN, [place] at [place] [phone]                           Time Spent with Patient: 15 (02/09/16 1245)

## 2016-02-10 ENCOUNTER — Ambulatory Visit
Admission: RE | Admit: 2016-02-10 | Discharge: 2016-02-10 | Disposition: A | Discharge status: Home / Self Care | Payer: Medicare Other | Source: Ambulatory Visit | Attending: Radiation Oncology | Admitting: Radiation Oncology

## 2016-02-10 ENCOUNTER — Ambulatory Visit
Admit: 2016-02-10 | Discharge: 2016-02-10 | Disposition: A | Discharge status: Home / Self Care | Payer: Medicare Other | Attending: Radiation Oncology | Admitting: Radiation Oncology

## 2016-02-10 DIAGNOSIS — C3492 Malignant neoplasm of unspecified part of left bronchus or lung: Secondary | ICD-10-CM

## 2016-02-10 DIAGNOSIS — Z510 Encounter for antineoplastic radiation therapy: Secondary | ICD-10-CM | POA: Diagnosis not present

## 2016-02-12 NOTE — Progress Notes (Signed)
  Radiation Oncology         [phone]) [phone] ________________________________  Name: Chad Wang MRN: [id number]  Date: 02/10/2016  DOB: 1956/01/30  SIMULATION AND TREATMENT PLANNING NOTE    ICD-9-CM ICD-10-CM   1. 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  Identity was confirmed.  All relevant records and images related to the planned course of therapy were reviewed.  The patient freely provided informed written consent to proceed with treatment after reviewing the details related to the planned course of therapy. The consent form was witnessed and verified by the simulation staff.  Then, the patient was set-up in a stable reproducible  supine position for radiation therapy.  CT images were obtained.  Surface markings were placed.  The CT images were loaded into the planning software.  Then the target and avoidance structures were contoured.  Treatment planning then occurred.  The radiation prescription was entered and confirmed.  Then, I designed and supervised the construction of a total of one medically necessary complex treatment devices.  I have requested : Intensity Modulated Radiotherapy (IMRT) is medically necessary for this case for the following reason:  Parotid sparing..  I have ordered:Nutrition Consult  PLAN:  The patient will receive a boost of radiation to the chest and defitive radiotherapy to the neck.  ________________________________  Sheral Apley. Tammi Klippel, M.D.

## 2016-02-13 ENCOUNTER — Ambulatory Visit: Payer: Medicare Other | Admitting: Radiation Oncology

## 2016-02-13 ENCOUNTER — Ambulatory Visit
Admission: RE | Admit: 2016-02-13 | Discharge: 2016-02-13 | Disposition: A | Discharge status: Home / Self Care | Payer: Medicare Other | Source: Ambulatory Visit | Attending: Radiation Oncology | Admitting: Radiation Oncology

## 2016-02-13 ENCOUNTER — Ambulatory Visit: Payer: Medicare Other

## 2016-02-13 ENCOUNTER — Telehealth: Payer: Self-pay | Admitting: Radiation Oncology

## 2016-02-13 DIAGNOSIS — Z510 Encounter for antineoplastic radiation therapy: Secondary | ICD-10-CM | POA: Diagnosis not present

## 2016-02-13 NOTE — Telephone Encounter (Signed)
LM for the patient's sister to address her questions she posed last week.

## 2016-02-14 ENCOUNTER — Ambulatory Visit: Payer: Medicare Other

## 2016-02-14 ENCOUNTER — Ambulatory Visit
Admission: RE | Admit: 2016-02-14 | Discharge: 2016-02-14 | Disposition: A | Discharge status: Home / Self Care | Payer: Medicare Other | Source: Ambulatory Visit | Attending: Radiation Oncology | Admitting: Radiation Oncology

## 2016-02-14 ENCOUNTER — Encounter: Payer: Self-pay | Admitting: *Deleted

## 2016-02-14 ENCOUNTER — Telehealth: Payer: Self-pay | Admitting: *Deleted

## 2016-02-14 ENCOUNTER — Telehealth: Payer: Self-pay | Admitting: Radiation Oncology

## 2016-02-14 DIAGNOSIS — Z510 Encounter for antineoplastic radiation therapy: Secondary | ICD-10-CM | POA: Diagnosis not present

## 2016-02-14 NOTE — Telephone Encounter (Signed)
Faxed copy of new treatment calendar to [facility]. Confirmation fax of delivery obtained.

## 2016-02-14 NOTE — Telephone Encounter (Signed)
  Oncology Nurse Navigator Documentation  Navigator Location: [facility] (02/14/16 1508) Navigator Encounter Type: Telephone (02/14/16 1508) Telephone: Chad Wang Confirmation/Clarification (02/14/16 1508)             Barriers/Navigation Needs: Coordination of Care (02/14/16 1508)      Spoke with RN Chad Wang at [facility] to inquire of patient's arrival for his 2:20 XRT.  She explained she understood that appt was at 4:40, that his transportation had been arranged for this time.  She indicated she was not aware of the updated scheduled faxed to Ms. Chad Wang late this morning by RN Chad Wang, that Ms. Chad Wang is not in today.  I indicated I would provide Chad Wang a revised schedule for his delivery to her upon his return.  She voiced appreciation.  I informed Chad Wang, provided them [facility]'s phone # for future reference.  Chad Orem, RN, BSN, [place] at [place] [phone]                       Time Spent with Patient: 30 (02/14/16 1508)

## 2016-02-15 ENCOUNTER — Telehealth: Payer: Self-pay | Admitting: Radiation Oncology

## 2016-02-15 ENCOUNTER — Ambulatory Visit: Payer: Medicare Other

## 2016-02-15 ENCOUNTER — Ambulatory Visit
Admission: RE | Admit: 2016-02-15 | Discharge: 2016-02-15 | Disposition: A | Discharge status: Home / Self Care | Payer: Medicare Other | Source: Ambulatory Visit | Attending: Radiation Oncology | Admitting: Radiation Oncology

## 2016-02-15 DIAGNOSIS — Z510 Encounter for antineoplastic radiation therapy: Secondary | ICD-10-CM | POA: Diagnosis not present

## 2016-02-15 NOTE — Progress Notes (Signed)
  Oncology Nurse Navigator Documentation  Navigator Location: [facility] (02/14/16 1625) Navigator Encounter Type: Treatment (02/14/16 1625)         Treatment Initiated Date: 02/14/16 (02/14/16 1625) Patient Visit Type: OBSJGG (02/14/16 1625) Treatment Phase: First Radiation Tx (02/14/16 1625)     To provide support, encouragement and care continuity, met with Chad Wang for his initial Tomotherapy for epiglottic SCC and continued XRT for LUL SCC.  He arrived in wheelchair.  I reviewed the 2-step treatment process with him, explained he is receiving tmt for 2 areas of cancer.  He voiced understanding.   He tolerated tmt without difficulty. I provided him an Epic appt calendar, guided him to deliver to his RN. I transported him to [facility] entrance at his request for his return ride to [place], RN, BSN, [place] at [place] [phone]                           Time Spent with Patient: 45 (02/14/16 1625)

## 2016-02-15 NOTE — Telephone Encounter (Addendum)
LM for Chad Wang to return my call

## 2016-02-16 ENCOUNTER — Ambulatory Visit: Payer: Medicare Other

## 2016-02-16 ENCOUNTER — Ambulatory Visit
Admission: RE | Admit: 2016-02-16 | Discharge: 2016-02-16 | Disposition: A | Discharge status: Home / Self Care | Payer: Medicare Other | Source: Ambulatory Visit | Attending: Radiation Oncology | Admitting: Radiation Oncology

## 2016-02-16 DIAGNOSIS — Z510 Encounter for antineoplastic radiation therapy: Secondary | ICD-10-CM | POA: Diagnosis not present

## 2016-02-17 ENCOUNTER — Ambulatory Visit
Admission: RE | Admit: 2016-02-17 | Discharge: 2016-02-17 | Disposition: A | Discharge status: Home / Self Care | Payer: Medicare Other | Source: Ambulatory Visit | Attending: Radiation Oncology | Admitting: Radiation Oncology

## 2016-02-17 ENCOUNTER — Ambulatory Visit: Payer: Medicare Other

## 2016-02-17 ENCOUNTER — Encounter: Payer: Self-pay | Admitting: Radiation Oncology

## 2016-02-17 ENCOUNTER — Encounter: Payer: Self-pay | Admitting: *Deleted

## 2016-02-17 VITALS — BP 107/64 | HR 118 | Resp 18 | Wt 146.1 lb

## 2016-02-17 DIAGNOSIS — C3490 Malignant neoplasm of unspecified part of unspecified bronchus or lung: Secondary | ICD-10-CM

## 2016-02-17 DIAGNOSIS — Z510 Encounter for antineoplastic radiation therapy: Secondary | ICD-10-CM | POA: Diagnosis not present

## 2016-02-17 DIAGNOSIS — C3412 Malignant neoplasm of upper lobe, left bronchus or lung: Secondary | ICD-10-CM

## 2016-02-17 MED ORDER — RADIAPLEXRX EX GEL
Freq: Once | CUTANEOUS | Status: AC
  Administered 2016-02-17: 15:00:00 via TOPICAL

## 2016-02-17 NOTE — Progress Notes (Addendum)
Weight stable. Heart rate elevated. Oxygen therapy 6 liters via nasal cannula noted. Reports he is swallowing better and has even eaten a few solid foods. Residing at [facility] where he participates in PT three times per day. Denies cough. States, "so long as I have my oxygen I don't feel short of breath." Denies skin changes within treatment field. Oriented patient to staff and routine of the clinic. Provided patient with RADIATION THERAPY AND YOU handbook then, reviewed pertinent information. Educated patient reference potential side effects and management such as fatigue, skin changes, throat changes and nausea/vomiting. Provided patient with radiaplex and directed upon use. Answered all patient questions to the best of my ability. Provided patient with my business card and encouraged them to call with needs. Patient and family verbalized understanding of all reviewed.   BP 107/64 mmHg  Pulse 118  Resp 18  Wt 146 lb 1.6 oz (66.271 kg)  SpO2 99% Wt Readings from Last 3 Encounters:  02/17/16 146 lb 1.6 oz (66.271 kg)  02/07/16 149 lb 0.5 oz (67.6 kg)  01/24/16 156 lb 1.4 oz (70.8 kg)

## 2016-02-17 NOTE — Progress Notes (Signed)
  Oncology Nurse Navigator Documentation  Navigator Location: [facility] (02/17/16 1435) Navigator Encounter Type: Treatment (02/17/16 1435)           Patient Visit Type: RadOnc (02/17/16 1435)       Met with Chad Wang during his WUT with Dr. Tammi Klippel.  His sisters and brother accompanied him  I introduced myself further as his/their navigator, encouraged them to contact me with questions/concerns as tmts progress.  I provided my contact information for their reference.    I answered their questions regarding his tmt on tomotherapy for for both lung and laryngeal malignancies.  We discussed expected SEs for laryngeal tmt, addressed in particular importance of nutritional intake and hydration during and after his course of tmt.    They voiced understanding of information provided.  Gayleen Orem, RN, BSN, [place] at [place] [phone]                           Time Spent with Patient: 60 (02/17/16 1435)

## 2016-02-17 NOTE — Progress Notes (Signed)
  Radiation Oncology         [phone]   Name: Chad Wang MRN: [id number]   Date: 02/17/2016  DOB: July 12, 1956     Weekly Radiation Therapy Management    ICD-9-CM ICD-10-CM   1. Squamous cell lung cancer, unspecified laterality (HCC) 162.9 C34.90 hyaluronate sodium (RADIAPLEXRX) gel  2. Primary cancer of left upper lobe of lung (HCC) 162.3 C34.12     Current Dose: 10 Gy  Planned Dose:  50 Gy  Narrative The patient presents for routine under treatment assessment.  Weight stable. Heart rate elevated. Oxygen therapy 6 liters via nasal cannula noted. Reports he is swallowing better and has even eaten a few solid foods. Residing at [facility] where he participates in PT three times per day. Denies cough. States, "so long as I have my oxygen I don't feel short of breath." Denies skin changes within treatment field. Oriented patient to staff and routine of the clinic. Provided patient with RADIATION THERAPY AND YOU handbook then reviewed pertinent information. Educated patient reference potential side effects and management such as fatigue, skin changes, throat changes and nausea/vomiting. Provided patient with radiaplex and directed upon use. Answered all patient questions to the best of my ability. Provided patient with my business card and encouraged them to call with needs. Patient and family verbalized understanding of all reviewed. His family first noticed voice changes on Super Bowl Sunday. The patient noticed voice changes around mid-October when he was being treated for the flu.   The patient is without complaint. Set-up films were reviewed. The chart was checked.  Physical Findings  weight is 146 lb 1.6 oz (66.271 kg). His blood pressure is 107/64 and his pulse is 118. His respiration is 18 and oxygen saturation is 99%. . Weight is essentially stable over the last 10 days; lost 10 lbs in the last month. No significant changes. In wheelchair with nasal cannula in place.  Patient's right lung was clear. Left lung was notable for breath sounds with egophony suggesting some degree of consolidation.   Impression The patient is tolerating radiation.  Plan Continue treatment as planned.         Sheral Apley Tammi Klippel, M.D.  This document serves as a record of services personally performed by Tyler Pita, MD. It was created on his behalf by Arlyce Harman, a trained medical scribe. The creation of this record is based on the scribe's personal observations and the provider's statements to them. This document has been checked and approved by the attending provider.

## 2016-02-20 ENCOUNTER — Ambulatory Visit
Admission: RE | Admit: 2016-02-20 | Discharge: 2016-02-20 | Disposition: A | Discharge status: Home / Self Care | Payer: Medicare Other | Source: Ambulatory Visit | Attending: Radiation Oncology | Admitting: Radiation Oncology

## 2016-02-20 ENCOUNTER — Ambulatory Visit: Payer: Medicare Other

## 2016-02-20 DIAGNOSIS — Z510 Encounter for antineoplastic radiation therapy: Secondary | ICD-10-CM | POA: Diagnosis not present

## 2016-02-21 ENCOUNTER — Ambulatory Visit: Payer: Medicare Other

## 2016-02-21 ENCOUNTER — Other Ambulatory Visit: Payer: Self-pay | Admitting: Radiation Oncology

## 2016-02-21 ENCOUNTER — Ambulatory Visit
Admission: RE | Admit: 2016-02-21 | Discharge: 2016-02-21 | Disposition: A | Discharge status: Home / Self Care | Payer: Medicare Other | Source: Ambulatory Visit | Attending: Radiation Oncology | Admitting: Radiation Oncology

## 2016-02-21 DIAGNOSIS — Z510 Encounter for antineoplastic radiation therapy: Secondary | ICD-10-CM | POA: Diagnosis not present

## 2016-02-21 DIAGNOSIS — C3490 Malignant neoplasm of unspecified part of unspecified bronchus or lung: Secondary | ICD-10-CM

## 2016-02-21 DIAGNOSIS — C3412 Malignant neoplasm of upper lobe, left bronchus or lung: Secondary | ICD-10-CM

## 2016-02-22 ENCOUNTER — Ambulatory Visit
Admission: RE | Admit: 2016-02-22 | Discharge: 2016-02-22 | Disposition: A | Discharge status: Home / Self Care | Payer: Medicare Other | Source: Ambulatory Visit | Attending: Radiation Oncology | Admitting: Radiation Oncology

## 2016-02-22 ENCOUNTER — Ambulatory Visit: Payer: Medicare Other

## 2016-02-22 DIAGNOSIS — Z510 Encounter for antineoplastic radiation therapy: Secondary | ICD-10-CM | POA: Diagnosis not present

## 2016-02-23 ENCOUNTER — Ambulatory Visit: Payer: Medicare Other

## 2016-02-23 ENCOUNTER — Ambulatory Visit
Admission: RE | Admit: 2016-02-23 | Discharge: 2016-02-23 | Disposition: A | Discharge status: Home / Self Care | Payer: Medicare Other | Source: Ambulatory Visit | Attending: Radiation Oncology | Admitting: Radiation Oncology

## 2016-02-23 ENCOUNTER — Institutional Professional Consult (permissible substitution): Payer: Self-pay | Admitting: Pulmonary Disease

## 2016-02-23 ENCOUNTER — Encounter: Payer: Self-pay | Admitting: Radiation Oncology

## 2016-02-23 VITALS — BP 117/70 | HR 102 | Resp 18 | Wt 147.8 lb

## 2016-02-23 DIAGNOSIS — Z510 Encounter for antineoplastic radiation therapy: Secondary | ICD-10-CM | POA: Diagnosis not present

## 2016-02-23 DIAGNOSIS — C3490 Malignant neoplasm of unspecified part of unspecified bronchus or lung: Secondary | ICD-10-CM

## 2016-02-23 NOTE — Progress Notes (Addendum)
Weight and vitals stable. Denies pain. Reports a productive cough with yellow sputum. Denies hemoptysis. Reports he is coughing of more sputum recently. Denies pain or difficulty associated with swallowing. Reports shortness of breath with exertion. Reports breathing has improved. Oxygen therapy 6 liters via nasal cannula noted. Belly breathing noted. Sister questions when the patient should begin to taper down on the oxygen. Also, sister questions if the patient will need chemotherapy. Reports he is able to participate more in therapy because his energy level is improving. No hyperpigmentation or desquamation noted within treatment filed. Confirmed patient is scheduled to meet with Wadie Lessen, NP on Monday.   BP 117/70 mmHg  Pulse 102  Resp 18  Wt 147 lb 12.8 oz (67.042 kg)  SpO2 100% Wt Readings from Last 3 Encounters:  02/23/16 147 lb 12.8 oz (67.042 kg)  02/17/16 146 lb 1.6 oz (66.271 kg)  02/07/16 149 lb 0.5 oz (67.6 kg)   Called [phone],ext2 for patient transportation  Back to facility, they said they would send someone there shortly,patient transported via w/c to lobby waiting for his ride 3:18 PM

## 2016-02-23 NOTE — Progress Notes (Signed)
  Radiation Oncology         [phone]   Name: Chad Wang MRN: [id number]   Date: 02/23/2016  DOB: [birth year]     Weekly Radiation Therapy Management    ICD-9-CM ICD-10-CM   1. Squamous cell lung cancer, unspecified laterality (HCC) 162.9 C34.90     Current Dose: 20 Gy  Planned Dose:  50 Gy  Narrative The patient presents for routine under treatment assessment.  Weight and vitals stable. Denies pain. Reports a productive cough with yellow sputum. Denies hemoptysis. Reports he is coughing of more sputum recently. Denies pain or difficulty associated with swallowing. Reports shortness of breath with exertion. Reports breathing has improved. Oxygen therapy 6 liters via nasal cannula noted. Sister questions when the patient should begin to taper down on the oxygen. Also, sister questions if the patient will need chemotherapy. Reports he is able to participate more in therapy because his energy level is improving. No hyperpigmentation or desquamation noted within treatment field. He mentions he has a dry mouth. He mentions he has a sore throat but is managed with throat lozenges.  The patient is without complaint. Set-up films were reviewed. The chart was checked.  Physical Findings  weight is 147 lb 12.8 oz (67.042 kg). His blood pressure is 117/70 and his pulse is 102. His respiration is 18 and oxygen saturation is 100%.   Impression The patient is tolerating radiation.  Plan Continue treatment as planned. He is scheduled to meet with Wadie Lessen, NP 02/27/2016.         Sheral Apley Tammi Klippel, M.D.    This document serves as a record of services personally performed by Tyler Pita, MD. It was created on his behalf by Lendon Collar, a trained medical scribe. The creation of this record is based on the scribe's personal observations and the provider's statements to them. This document has been checked and approved by the attending provider.

## 2016-02-24 ENCOUNTER — Ambulatory Visit: Payer: Medicare Other

## 2016-02-24 ENCOUNTER — Ambulatory Visit
Admission: RE | Admit: 2016-02-24 | Discharge: 2016-02-24 | Disposition: A | Discharge status: Home / Self Care | Payer: Medicare Other | Source: Ambulatory Visit | Attending: Radiation Oncology | Admitting: Radiation Oncology

## 2016-02-24 DIAGNOSIS — C3491 Malignant neoplasm of unspecified part of right bronchus or lung: Secondary | ICD-10-CM

## 2016-02-24 DIAGNOSIS — Z510 Encounter for antineoplastic radiation therapy: Secondary | ICD-10-CM | POA: Diagnosis not present

## 2016-02-24 MED ORDER — SONAFINE EX EMUL
1.0000 "application " | Freq: Two times a day (BID) | CUTANEOUS | Status: DC
  Filled 2016-02-24: qty 45

## 2016-02-27 ENCOUNTER — Ambulatory Visit
Admission: RE | Admit: 2016-02-27 | Discharge: 2016-02-27 | Disposition: A | Discharge status: Home / Self Care | Payer: Medicare Other | Source: Ambulatory Visit | Attending: Radiation Oncology | Admitting: Radiation Oncology

## 2016-02-27 ENCOUNTER — Ambulatory Visit: Payer: Medicare Other

## 2016-02-27 DIAGNOSIS — Z515 Encounter for palliative care: Secondary | ICD-10-CM

## 2016-02-27 DIAGNOSIS — Z510 Encounter for antineoplastic radiation therapy: Secondary | ICD-10-CM | POA: Diagnosis not present

## 2016-02-27 DIAGNOSIS — Z7189 Other specified counseling: Secondary | ICD-10-CM | POA: Diagnosis not present

## 2016-02-27 DIAGNOSIS — C321 Malignant neoplasm of supraglottis: Secondary | ICD-10-CM

## 2016-02-27 NOTE — Consult Note (Signed)
Consultation Note Date: 02/27/2016   Patient Name: Chad Wang  DOB: 04/01/1956  MRN: [id number]  Age / Sex: 60 y.o., male  PCP: No Pcp Per Patient Referring Physician: Tyler Pita, MD  Reason for Consultation: Establishing goals of care, Non pain symptom management, Pain control and Psychosocial/spiritual support  Introduction of Palliative Medicine into a holistic treatment plan  HPI/Patient Profile:  Per oncology  1. Squamous cell carcinoma of the larynx: He presented with hoarseness and a 2.3 cm supraglottic laryngeal mass. He does have significant smoking history which might have contributed to this etiology. His CT scan of the neck did not show any evidence of obvious lymphadenopathy.  From a management standpoint, definitive therapy with chemoradiation is the standard of care at this time. I do not think he needs neoadjuvant systemic chemotherapy at this time. Ideally, obtaining a PET CT scan for staging purposes would be the next step. This could be deferred because of the urgency of need for treatment and him.  I think he would be a reasonable cisplatinum candidate with good kidney function and reasonable performance status. I would probably use 40 mg/m on a weekly basis to start with radiation therapy. Complications from this regimen would include nausea, fatigue, myelosuppression, renal insufficiency, electrolyte imbalance among others.  2. Squamous cell carcinoma of the lung presenting with endobronchial lesion and left lower lung collapse. I see no evidence of clear-cut metastasis in the thorax or any regional lymphadenopathy. PET scan would be helpful to complete the staging with CT scan of the abdomen and pelvis would be reasonable at this time instead.  Whether this is a primary lung cancer or metastatic squamous cell carcinoma of the larynx is unclear. It is very possible that we are  dealing with 2 separate primaries that could be potentially treatable.  The first up is to proceed with radiation therapy to improve aeration of the left lower lung and concomitant chemotherapy might be reasonable if we are attempting to treat his head and neck cancer as well. / Currently receiving radiation per Dr Tammi Klippel  H/O schizophrenia     Clinical Assessment and Goals of Care:   This NP Wadie Lessen reviewed medical records, received report from team, assessed the patient and then meet at the patient's bedside along with his sister Maudie Mercury  to discuss diagnosis prognosis, [place], EOL wishes disposition and options.   A detailed discussion was had today regarding importance of advanced directives.  Concepts specific to code status, artifical feeding and hydration, continued IV antibiotics and rehospitalization was had.  The difference between a aggressive medical intervention path  and a palliative comfort care path for this patient at this time was had.  Discussion  was had regarding decisions around possible trach and PEG and PAC  Values and goals of care important to patient and family were attempted to be elicited.  Concept of Hospice and Palliative Care were discussed  Natural trajectory and expectations at EOL were discussed.  Questions and concerns addressed.  Hard Choices booklet left for  review. Family encouraged to call with questions or concerns.  PMT will continue to support holistically.   HCPOA    SUMMARY OF RECOMMENDATIONS    Code Status/Advance Care Planning:   Full code    At this time patient and family are expressing desire for all offered and available medical interventions to prolong life.    Symptom Management:   Recommendation to SNF/[facility] -   Soft diet -   Lidocaine throat spray -  Glucerna supplement -palliative services to f/u at SNF, will make refferral    Palliative Prophylaxis:    Aspiration, Bowel Regimen, Frequent Pain  Assessment and Oral Care  Additional Recommendations (Limitations, Scope, Preferences):   Patient and family is open to all offered and available medical interventions to prolong life  Psycho-social/Spiritual:   Additional Recommendations: Education on Hospice  Prognosis:   < 6 months  Discharge Planning: [place] for rehab with Palliative care service follow-up     Primary Diagnoses: Present on Admission:  **None**  I have reviewed the medical record, interviewed the patient and family, and examined the patient. The following aspects are pertinent.  Past Medical History  Diagnosis Date  . Schizophrenia ([place])   . Hypertension   . Hyperlipemia   . GERD (gastroesophageal reflux disease)   . Diabetes mellitus without complication [facility])    Social History   Social History  . Marital Status: Single    Spouse Name: N/A  . Number of Children: N/A  . Years of Education: N/A   Social History Main Topics  . Smoking status: Current Every Day Smoker  . Smokeless tobacco: Not on file  . Alcohol Use: Yes  . Drug Use: No  . Sexual Activity: Yes   Other Topics Concern  . Not on file   Social History Narrative   Family History  Problem Relation Age of Onset  . CAD Mother   . Lung cancer Father   . CAD Brother   . Diabetes Other   . Stroke Neg Hx    Scheduled Meds: Continuous Infusions: PRN Meds:. Medications Prior to Admission:  Prior to Admission medications   Medication Sig Start Date End Date Taking? Authorizing Provider  albuterol (PROVENTIL HFA;VENTOLIN HFA) 108 (90 Base) MCG/ACT inhaler Inhale 2 puffs into the lungs every 6 (six) hours as needed for wheezing or shortness of breath. 01/28/16   Reyne Dumas, MD  amoxicillin-clavulanate (AUGMENTIN) 875-125 MG tablet Take 1 tablet by mouth every 12 (twelve) hours. 02/07/16   Debbe Odea, MD  chlorpheniramine-HYDROcodone (TUSSIONEX) 10-8 MG/5ML SUER Take 5 mLs by mouth every 12 (twelve) hours. 01/28/16    Reyne Dumas, MD  cloZAPine (CLOZARIL) 100 MG tablet Take 100 mg by mouth at bedtime.     Historical Provider, MD  divalproex (DEPAKOTE) 500 MG DR tablet Take 500-1,000 mg by mouth 2 (two) times daily. Takes '500mg'$  in am and '1000mg'$  in pm    Historical Provider, MD  docusate sodium (COLACE) 100 MG capsule Take 1 capsule (100 mg total) by mouth 2 (two) times daily. 01/28/16   Reyne Dumas, MD  feeding supplement, GLUCERNA SHAKE, (GLUCERNA SHAKE) LIQD Take 237 mLs by mouth 2 (two) times daily between meals. 01/28/16   Reyne Dumas, MD  gemfibrozil (LOPID) 600 MG tablet Take 600 mg by mouth 2 (two) times daily.    Historical Provider, MD  metFORMIN (GLUCOPHAGE) 1000 MG tablet Take 500 mg by mouth 2 (two) times daily with a meal.     Historical Provider, MD  mometasone-formoterol (DULERA) 200-5 MCG/ACT AERO Inhale 2 puffs into the lungs 2 (two) times daily. 01/28/16   Reyne Dumas, MD  Wound Cleansers (RADIAPLEX EX) Apply topically.    Historical Provider, MD  Wound Dressings (SONAFINE) Apply 1 application topically 3 (three) times daily. 02/24/16   Tyler Pita, MD   No Known Allergies Review of Systems  Constitutional: Positive for activity change, appetite change and fatigue.  HENT: Positive for trouble swallowing.     Physical Exam  Constitutional: He appears cachectic. He appears ill.  HENT:  Mouth/Throat: Dental caries present. No oropharyngeal exudate.  Skin: Skin is warm and dry.    Vital Signs: There were no vitals taken for this visit.         SpO2:   O2 Device:  O2 Flow Rate: .   IO: Intake/output summary: No intake or output data in the 24 hours ending 02/27/16 1655  LBM:   Baseline Weight:   Most recent weight:       Palliative Assessment/Data:   Discussed with Worthy Flank PA-C  Time In: 1500 Time Out: 1615 Time Total: 75 min Greater than 50%  of this time was spent counseling and coordinating care related to the above assessment and plan.   I am available to  meet with this patient in the future at times convenient for patient and family, family educated on how to set up another appointment.   Signed by: Wadie Lessen, NP   Please contact Palliative Medicine Team phone at [phone] for questions and concerns.  For individual provider: See Shea Evans

## 2016-02-28 ENCOUNTER — Ambulatory Visit
Admission: RE | Admit: 2016-02-28 | Discharge: 2016-02-28 | Disposition: A | Discharge status: Home / Self Care | Payer: Medicare Other | Source: Ambulatory Visit | Attending: Radiation Oncology | Admitting: Radiation Oncology

## 2016-02-28 ENCOUNTER — Ambulatory Visit: Payer: Medicare Other

## 2016-02-28 DIAGNOSIS — Z510 Encounter for antineoplastic radiation therapy: Secondary | ICD-10-CM | POA: Diagnosis not present

## 2016-02-28 DIAGNOSIS — C3491 Malignant neoplasm of unspecified part of right bronchus or lung: Secondary | ICD-10-CM

## 2016-02-28 NOTE — Progress Notes (Signed)
  Radiation Oncology         [phone] ________________________________  Name: RAYDON CHAPPUIS MRN: [id number]  Date: 02/28/2016  DOB: Jan 20, 1956  Chart Note:  CT sim was repeated today to assess for anatomic changes related to lung cancer shrinkage and allow re-planning if needed.  ________________________________  Sheral Apley Tammi Klippel, M.D.

## 2016-02-29 ENCOUNTER — Ambulatory Visit: Payer: Medicare Other

## 2016-02-29 ENCOUNTER — Encounter: Payer: Self-pay | Admitting: *Deleted

## 2016-02-29 ENCOUNTER — Telehealth: Payer: Self-pay | Admitting: *Deleted

## 2016-02-29 ENCOUNTER — Ambulatory Visit
Admission: RE | Admit: 2016-02-29 | Discharge: 2016-02-29 | Disposition: A | Discharge status: Home / Self Care | Payer: Medicare Other | Source: Ambulatory Visit | Attending: Radiation Oncology | Admitting: Radiation Oncology

## 2016-02-29 NOTE — Telephone Encounter (Signed)
Tried calling patient to remind him of his appointment with Dr. Alen Blew tomorrow at 11:00 am.

## 2016-02-29 NOTE — Telephone Encounter (Signed)
CALLED PATIENT TO INFORM OF APPT. WITH DR. SHADAD ON 03-01-16 @ 10:30 AM, NO ANSWER, PUT APPT. CARD ON PATIENT'S UNDER TREAT CHART

## 2016-03-01 ENCOUNTER — Other Ambulatory Visit (HOSPITAL_COMMUNITY)
Admission: RE | Admit: 2016-03-01 | Discharge: 2016-03-01 | Disposition: A | Discharge status: Home / Self Care | Payer: No Typology Code available for payment source | Source: Ambulatory Visit | Attending: Oncology | Admitting: Oncology

## 2016-03-01 ENCOUNTER — Ambulatory Visit
Admission: RE | Admit: 2016-03-01 | Discharge: 2016-03-01 | Disposition: A | Discharge status: Home / Self Care | Payer: Medicare Other | Source: Ambulatory Visit | Attending: Radiation Oncology | Admitting: Radiation Oncology

## 2016-03-01 ENCOUNTER — Telehealth: Payer: Self-pay

## 2016-03-01 ENCOUNTER — Ambulatory Visit (HOSPITAL_BASED_OUTPATIENT_CLINIC_OR_DEPARTMENT_OTHER): Payer: Medicare Other | Admitting: Oncology

## 2016-03-01 ENCOUNTER — Telehealth: Payer: Self-pay | Admitting: Oncology

## 2016-03-01 ENCOUNTER — Ambulatory Visit: Payer: Medicare Other

## 2016-03-01 VITALS — BP 104/66 | HR 136 | Temp 98.2°F | Resp 19 | Wt 142.7 lb

## 2016-03-01 DIAGNOSIS — C7800 Secondary malignant neoplasm of unspecified lung: Secondary | ICD-10-CM | POA: Diagnosis present

## 2016-03-01 DIAGNOSIS — C321 Malignant neoplasm of supraglottis: Secondary | ICD-10-CM

## 2016-03-01 DIAGNOSIS — C3491 Malignant neoplasm of unspecified part of right bronchus or lung: Secondary | ICD-10-CM

## 2016-03-01 DIAGNOSIS — C801 Malignant (primary) neoplasm, unspecified: Secondary | ICD-10-CM | POA: Insufficient documentation

## 2016-03-01 DIAGNOSIS — C7989 Secondary malignant neoplasm of other specified sites: Secondary | ICD-10-CM | POA: Diagnosis present

## 2016-03-01 DIAGNOSIS — R0600 Dyspnea, unspecified: Secondary | ICD-10-CM | POA: Insufficient documentation

## 2016-03-01 DIAGNOSIS — C3412 Malignant neoplasm of upper lobe, left bronchus or lung: Secondary | ICD-10-CM

## 2016-03-01 NOTE — Telephone Encounter (Signed)
I called and spoke with the transportation scheduler at [facility]. She is aware that Chad Wang needs to be here at 9:00 am and 3:00 pm for his Radiation Treatments. She stated she would arrange transportation for these times.

## 2016-03-01 NOTE — Progress Notes (Signed)
Hematology and Oncology Follow Up Visit  Chad Wang [id number] 12/15/1955 60 y.o. 03/01/2016 10:49 AM No PCP Per PatientNo ref. provider found   Principle Diagnosis: 60 year old gentleman with the following diagnoses:   1. 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 This was diagnosed on 01/18/2016. 2. Squamous cell carcinoma of the lung presented with an endobronchial lesion and left lower lung collapse. In March 2017.   Prior Therapy: He is status post bronchoscopy and laryngitis daily and biopsy of both these lesions in March 2017.  Current therapy: He is currently receiving definitive radiation therapy for both lesions.  Interim History:  Chad Wang presents today for a follow-up visit with his sister. He is a pleasant gentleman I saw in consultation while hospitalized in April 2017. He is a gentleman with history of schizophrenia and was hospitalized in March 2017 and subsequently discharged to skilled nursing facility. Since his discharge, he been receiving radiation therapy and have tolerated it well. He does report some occasional cough that is productive at times. He also reports some hoarseness. His breathing is improving slowly.  He does not report any headaches, blurry vision, syncope or seizures. He does not report any fevers chills or sweats. Does not report any chest pain, palpitation or orthopnea. Does not report any hemoptysis but does report some wheezing and dyspnea on exertion. Does not report any nausea, vomiting or abdominal pain. Does not report any frequency urgency or has a. Does not report any skeletal complaints. Remaining review of systems unremarkable.  Medications: I have reviewed the patient's current medications.  Current Outpatient Prescriptions  Medication Sig Dispense Refill  . albuterol (PROVENTIL HFA;VENTOLIN HFA) 108 (90 Base) MCG/ACT inhaler Inhale 2 puffs into the lungs every 6 (six) hours as  needed for wheezing or shortness of breath. 1 Inhaler 2  . amoxicillin-clavulanate (AUGMENTIN) 875-125 MG tablet Take 1 tablet by mouth every 12 (twelve) hours.    . chlorpheniramine-HYDROcodone (TUSSIONEX) 10-8 MG/5ML SUER Take 5 mLs by mouth every 12 (twelve) hours. 250 mL 0  . cloZAPine (CLOZARIL) 100 MG tablet Take 100 mg by mouth at bedtime.     . divalproex (DEPAKOTE) 500 MG DR tablet Take 500-1,000 mg by mouth 2 (two) times daily. Takes '500mg'$  in am and '1000mg'$  in pm    . docusate sodium (COLACE) 100 MG capsule Take 1 capsule (100 mg total) by mouth 2 (two) times daily. 10 capsule 0  . feeding supplement, GLUCERNA SHAKE, (GLUCERNA SHAKE) LIQD Take 237 mLs by mouth 2 (two) times daily between meals. 237 mL 30  . gemfibrozil (LOPID) 600 MG tablet Take 600 mg by mouth 2 (two) times daily.    . metFORMIN (GLUCOPHAGE) 1000 MG tablet Take 500 mg by mouth 2 (two) times daily with a meal.     . mometasone-formoterol (DULERA) 200-5 MCG/ACT AERO Inhale 2 puffs into the lungs 2 (two) times daily. 1 Inhaler 2  . Wound Cleansers (RADIAPLEX EX) Apply topically.    . Wound Dressings (SONAFINE) Apply 1 application topically 3 (three) times daily.     No current facility-administered medications for this visit.     Allergies: No Known Allergies  Past Medical History, Surgical history, Social history, and Family History were reviewed and updated.   Physical Exam: Blood pressure 104/66, pulse 136, temperature 98.2 F (36.8 C), temperature source Oral, resp. rate 19, weight 142 lb 11.2 oz (64.728 kg), SpO2 98 %. ECOG: 2 General appearance: alert and cooperative Head:  Normocephalic, without obvious abnormality Neck: no adenopathy Lymph nodes: Cervical, supraclavicular, and axillary nodes normal. Heart:regular rate and rhythm, S1, S2 normal, no murmur, click, rub or gallop Lung: Decreased breath sounds on the left but certainly improving. Expiratory wheezes noted. Abdomin: soft, non-tender, without  masses or organomegaly EXT:no erythema, induration, or nodules   Lab Results: Lab Results  Component Value Date   WBC 9.6 02/05/2016   HGB 13.7 02/05/2016   HCT 40.1 02/05/2016   MCV 82.0 02/05/2016   PLT 224 02/05/2016     Chemistry      Component Value Date/Time   NA 137 02/06/2016 0540   K 4.3 02/06/2016 0540   CL 104 02/06/2016 0540   CO2 25 02/06/2016 0540   BUN 15 02/06/2016 0540   CREATININE 0.53* 02/06/2016 0540      Component Value Date/Time   CALCIUM 8.8* 02/06/2016 0540   ALKPHOS 66 01/31/2016 2242   AST 39 01/31/2016 2242   ALT 14* 01/31/2016 2242   BILITOT 1.2 01/31/2016 2242         Impression and Plan:   60 year old gentleman with the following issues:  1. Squamous cell carcinoma of the larynx: He presented with hoarseness and a 2.3 cm supraglottic laryngeal mass. He does have significant smoking history which might have contributed to this etiology. His CT scan of the neck did not show any evidence of obvious lymphadenopathy. This was diagnosed in March 2017.  He is currently receiving radiation therapy for curative purposes. He has tolerated therapy well except for hoarseness no plans to add systemic therapy at this time.  2. Squamous cell carcinoma of the lung presenting with endobronchial lesion and left lower lung collapse. No evidence of clear-cut metastasis in the thorax or any regional lymphadenopathy.   He is currently receiving radiation therapy to improve aeration of the left lung. The plan is to complete radiation therapy and obtain staging PET/CT scan. Depending on these findings will determine the role of systemic therapy at that time. His tumor had been sent for PDL-1  testing in case he will need systemic therapy and the use of immunotherapy would be reasonable option for him depending on the results.  3. Dyspnea on exertion: Seems to be improving as is left lung aeration is improving as well.  4. Prognosis remains guarded at this time  although he has potentially 2 incurable malignancies is very unlikely that treatment will be curative.  5. Follow-up: Will be in June 2017 after repeat PET scan upon conclusion of radiation therapy. [facility], MD 5/4/201710:49 AM

## 2016-03-01 NOTE — Telephone Encounter (Signed)
per pof to sch pt appt-[facility] pt [facility] call -for pET-gave avs

## 2016-03-02 ENCOUNTER — Ambulatory Visit: Payer: Medicare Other

## 2016-03-02 ENCOUNTER — Ambulatory Visit
Admission: RE | Admit: 2016-03-02 | Discharge: 2016-03-02 | Disposition: A | Discharge status: Home / Self Care | Payer: Medicare Other | Source: Ambulatory Visit | Attending: Radiation Oncology | Admitting: Radiation Oncology

## 2016-03-02 DIAGNOSIS — Z510 Encounter for antineoplastic radiation therapy: Secondary | ICD-10-CM | POA: Diagnosis not present

## 2016-03-03 ENCOUNTER — Ambulatory Visit: Payer: Medicare Other

## 2016-03-05 ENCOUNTER — Encounter: Payer: Self-pay | Admitting: Radiation Oncology

## 2016-03-05 ENCOUNTER — Ambulatory Visit
Admission: RE | Admit: 2016-03-05 | Discharge: 2016-03-05 | Disposition: A | Discharge status: Home / Self Care | Payer: Medicare Other | Source: Ambulatory Visit | Attending: Radiation Oncology | Admitting: Radiation Oncology

## 2016-03-05 ENCOUNTER — Ambulatory Visit: Admission: RE | Admit: 2016-03-05 | Payer: Medicare Other | Source: Ambulatory Visit

## 2016-03-05 ENCOUNTER — Ambulatory Visit: Payer: Medicare Other

## 2016-03-05 VITALS — BP 117/77 | HR 107 | Temp 97.9°F | Resp 20

## 2016-03-05 DIAGNOSIS — C321 Malignant neoplasm of supraglottis: Secondary | ICD-10-CM

## 2016-03-05 DIAGNOSIS — Z7189 Other specified counseling: Secondary | ICD-10-CM | POA: Insufficient documentation

## 2016-03-05 DIAGNOSIS — H109 Unspecified conjunctivitis: Secondary | ICD-10-CM

## 2016-03-05 DIAGNOSIS — C3412 Malignant neoplasm of upper lobe, left bronchus or lung: Secondary | ICD-10-CM

## 2016-03-05 DIAGNOSIS — I10 Essential (primary) hypertension: Secondary | ICD-10-CM

## 2016-03-05 DIAGNOSIS — Z515 Encounter for palliative care: Secondary | ICD-10-CM | POA: Insufficient documentation

## 2016-03-05 DIAGNOSIS — Z510 Encounter for antineoplastic radiation therapy: Secondary | ICD-10-CM | POA: Diagnosis not present

## 2016-03-05 MED ORDER — CIPROFLOXACIN HCL 0.3 % OP SOLN: 2.0000 [drp] | Freq: Four times a day (QID) | OPHTHALMIC | Status: DC

## 2016-03-05 NOTE — Progress Notes (Signed)
Department of Radiation Oncology  Phone:  [phone] Fax:        [phone]  Weekly Treatment Note    Name: Chad Wang Date: 03/05/2016 MRN: [id number] DOB: 1956-09-21   Diagnosis:     ICD-9-CM ICD-10-CM   1. Conjunctivitis of right eye 372.30 H10.9 ciprofloxacin (CILOXAN) 0.3 % ophthalmic solution  2. Essential hypertension 401.9 I10 Keep Oxygen Setup At Bedside  3. 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 albuterol (PROVENTIL HFA;VENTOLIN HFA) 108 (90 Base) MCG/ACT inhaler Inhale 2 puffs into the lungs every 6 (six) hours as needed for wheezing or shortness of breath. 1 Inhaler 2  . chlorpheniramine-HYDROcodone (TUSSIONEX) 10-8 MG/5ML SUER Take 5 mLs by mouth every 12 (twelve) hours. 250 mL 0  . cloZAPine (CLOZARIL) 100 MG tablet Take 100 mg by mouth at bedtime.     . divalproex (DEPAKOTE) 500 MG DR tablet Take 500-1,000 mg by mouth 2 (two) times daily. Takes '500mg'$  in am and '1000mg'$  in pm    . docusate sodium (COLACE) 100 MG capsule Take 1 capsule (100 mg total) by mouth 2 (two) times daily. 10 capsule 0  . feeding supplement, GLUCERNA SHAKE, (GLUCERNA SHAKE) LIQD Take 237 mLs by mouth 2 (two) times daily between meals. 237 mL 30  . gemfibrozil (LOPID) 600 MG tablet Take 600 mg by mouth 2 (two) times daily.    . metFORMIN (GLUCOPHAGE) 1000 MG tablet Take 500 mg by mouth 2 (two) times daily with a meal.     . mometasone-formoterol (DULERA) 200-5 MCG/ACT AERO Inhale 2 puffs into the lungs 2 (two) times daily. 1 Inhaler 2  . Wound Cleansers (RADIAPLEX EX) Apply topically.    . Wound Dressings (SONAFINE) Apply 1 application topically 3 (three) times daily.    Marland Kitchen amoxicillin-clavulanate (AUGMENTIN) 875-125 MG tablet Take 1 tablet by mouth every 12 (twelve) hours.  (Patient not taking: Reported on 03/05/2016)    . ciprofloxacin (CILOXAN) 0.3 % ophthalmic solution Place 2 drops into the right eye 4 (four) times daily. Administer 1 drop, every 2 hours, while awake, for 2 days. Then 1 drop, every 4 hours, while awake, for the next 5 days. 5 mL 0   No current facility-administered medications for this encounter.     ALLERGIES: Review of patient's allergies indicates no known allergies.   LABORATORY DATA:  Lab Results  Component Value Date   WBC 9.6 02/05/2016   HGB 13.7 02/05/2016   HCT 40.1 02/05/2016   MCV 82.0 02/05/2016   PLT 224 02/05/2016   Lab Results  Component Value Date   NA 137 02/06/2016   K 4.3 02/06/2016   CL 104 02/06/2016   CO2 25 02/06/2016   Lab Results  Component Value Date   ALT 14* 01/31/2016   AST 39 01/31/2016   ALKPHOS 66 01/31/2016   BILITOT 1.2 01/31/2016     NARRATIVE: Chad Wang was seen today for weekly treatment management. The chart was checked and the patient's films were reviewed.  weeklyu rad txs left lung and larynx, erythmea on neck and chest sonafine daily,  hoarseness, on 6 liters n/c, sob, with  Mild exertion,  pain in back of neck some, on hydrocodone tussenex prn pain, coughing up copius amouts clear phelgm, very fatigued, ,  whispers, difficulty swallowing solid foods, says hes' taking something for that, no medication is listed in mar,  ,not weighed today, too fatigued 3:46 PM Wt Readings from Last 3 Encounters:  03/01/16 142 lb 11.2 oz (64.728 kg)  02/23/16 147 lb 12.8 oz (67.042 kg)  02/17/16 146 lb 1.6 oz (66.271 kg)   BP 117/77 mmHg  Pulse 107  Temp(Src) 97.9 F (36.6 C) (Oral)  Resp 20  Wt   SpO2 100%  PHYSICAL EXAMINATION: oral temperature is 97.9 F (36.6 C). His blood pressure is 117/77 and his pulse is 107. His respiration is 20 and oxygen saturation is 100%.      Conjunctivitis present on the right. Patient currently on 6 L oxygen nasal cannula  ASSESSMENT: The patient  is doing satisfactorily with treatment.  PLAN: We will continue with the patient's radiation treatment as planned. The patient has been given antibiotics for what is felt to be pink eye on the right. We will also decrease his supplemental oxygen to 4 L nasal cannula and recheck his oxygen saturation level prior to leaving our clinic. Otherwise we will continue his radiation treatment as planned.

## 2016-03-05 NOTE — Progress Notes (Addendum)
weeklyu rad txs left lung and larynx, erythmea on neck and chest sonafine daily,  hoarseness, on 6 liters n/c, sob, with  Mild exertion,  pain in back of neck some, on hydrocodone tussenex prn pain, coughing up copius amouts clear phelgm, very fatigued, , whispers, difficulty swallowing solid foods, says hes' taking something for that, no medication is listed in mar,  ,not weighed today, too fatigued 2:59 PM Wt Readings from Last 3 Encounters:  03/01/16 142 lb 11.2 oz (64.728 kg)  02/23/16 147 lb 12.8 oz (67.042 kg)  02/17/16 146 lb 1.6 oz (66.271 kg)   BP 117/77 mmHg  Pulse 107  Temp(Src) 97.9 F (36.6 C) (Oral)  Resp 20  Wt   SpO2 100%

## 2016-03-06 ENCOUNTER — Ambulatory Visit: Payer: Medicare Other

## 2016-03-06 ENCOUNTER — Telehealth: Payer: Self-pay | Admitting: Radiation Oncology

## 2016-03-06 ENCOUNTER — Other Ambulatory Visit: Payer: Self-pay | Admitting: Radiation Oncology

## 2016-03-06 ENCOUNTER — Ambulatory Visit
Admission: RE | Admit: 2016-03-06 | Discharge: 2016-03-06 | Disposition: A | Discharge status: Home / Self Care | Payer: Medicare Other | Source: Ambulatory Visit | Attending: Radiation Oncology | Admitting: Radiation Oncology

## 2016-03-06 ENCOUNTER — Encounter (HOSPITAL_COMMUNITY): Payer: Self-pay

## 2016-03-06 DIAGNOSIS — Z510 Encounter for antineoplastic radiation therapy: Secondary | ICD-10-CM | POA: Diagnosis not present

## 2016-03-06 NOTE — Telephone Encounter (Signed)
I called to update the patient's sister on her brother's progress. She was not available and I left her a message to chat at her convenience.

## 2016-03-06 NOTE — Progress Notes (Signed)
I saw the patient as he was coming around for an under treatment visit with Dr. Lisbeth Renshaw. His vitals were reviewed and his pulse ox was 100% on 6L [place]. He did complain of watery changes and drainage from his right eye. He has been told that there are some residents at the facility who have had "pink eye."  He is breathing better per report and his voice is markedly improved and less hoarse. He has some irritation of the skin without breakdown on the right neck and is using sonafine cream. We discussed the use of empiric ciprofloxacin eye drops and a new prescription for this was given. We also dropped his O2 down to 4L [place] and repeated his pulse ox which was again between 99-100%. I will discuss with Dr. Tammi Klippel as well to keep him informed, and we will also continue to work with palliative care as we move frward as well.

## 2016-03-07 ENCOUNTER — Telehealth: Payer: Self-pay | Admitting: Radiation Oncology

## 2016-03-07 ENCOUNTER — Ambulatory Visit: Payer: Medicare Other

## 2016-03-07 ENCOUNTER — Ambulatory Visit
Admission: RE | Admit: 2016-03-07 | Discharge: 2016-03-07 | Disposition: A | Discharge status: Home / Self Care | Payer: Medicare Other | Source: Ambulatory Visit | Attending: Radiation Oncology | Admitting: Radiation Oncology

## 2016-03-07 DIAGNOSIS — Z510 Encounter for antineoplastic radiation therapy: Secondary | ICD-10-CM | POA: Diagnosis not present

## 2016-03-07 NOTE — Telephone Encounter (Signed)
Received patient in nursing following radiation treatment. Conjunctivitis appears worse in patient's right eye. Patient questions why he hasn't began receiving his eye drops. Phoned Marzetta Board, RN at [facility] to inquire. She reports the medication had to be order but, they should receive it today. Marzetta Board, RN plans to begin administering the eye drops asap. Explained these findings to the patient and he verbalized understanding.

## 2016-03-08 ENCOUNTER — Ambulatory Visit: Payer: Medicare Other

## 2016-03-08 ENCOUNTER — Telehealth: Payer: Self-pay | Admitting: Radiation Oncology

## 2016-03-08 ENCOUNTER — Ambulatory Visit
Admission: RE | Admit: 2016-03-08 | Discharge: 2016-03-08 | Disposition: A | Discharge status: Home / Self Care | Payer: Medicare Other | Source: Ambulatory Visit | Attending: Radiation Oncology | Admitting: Radiation Oncology

## 2016-03-08 DIAGNOSIS — Z510 Encounter for antineoplastic radiation therapy: Secondary | ICD-10-CM | POA: Diagnosis not present

## 2016-03-08 NOTE — Telephone Encounter (Signed)
Per Alison's request phoned patient's sister, Joelene Millin, making her aware her brother's PUT visit with Tammi Klippel is scheduled for Friday, May 12 at 3pm. She verbalized understanding.

## 2016-03-09 ENCOUNTER — Ambulatory Visit: Payer: Medicare Other

## 2016-03-09 ENCOUNTER — Ambulatory Visit
Admission: RE | Admit: 2016-03-09 | Discharge: 2016-03-09 | Disposition: A | Discharge status: Home / Self Care | Payer: Medicare Other | Source: Ambulatory Visit | Attending: Radiation Oncology | Admitting: Radiation Oncology

## 2016-03-09 VITALS — BP 117/81 | HR 86 | Resp 18

## 2016-03-09 DIAGNOSIS — Z510 Encounter for antineoplastic radiation therapy: Secondary | ICD-10-CM | POA: Diagnosis not present

## 2016-03-09 DIAGNOSIS — C3412 Malignant neoplasm of upper lobe, left bronchus or lung: Secondary | ICD-10-CM

## 2016-03-09 DIAGNOSIS — C321 Malignant neoplasm of supraglottis: Secondary | ICD-10-CM

## 2016-03-09 NOTE — Progress Notes (Signed)
  Radiation Oncology         [id number]   Name: Chad Wang MRN: [id number]   Date: 03/09/2016  DOB: 1956/02/13     Weekly Radiation Therapy Management    ICD-9-CM ICD-10-CM   1. Primary cancer of left upper lobe of lung (HCC) 162.3 C34.12   2. 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  Vitals stable. Denies pain. Oxygen therapy at 2 liters via nasal cannula. Pulse ox ranges from 96-97% on 2 liters. Patient denies dypnea.      The patient is without complaint. Set-up films were reviewed. The chart was checked.  Physical Findings  blood pressure is 117/81 and his pulse is 86. His respiration is 18 and oxygen saturation is 97%.   Impression The patient is tolerating radiation.  Plan Continue treatment as planned.       Sheral Apley Tammi Klippel, M.D.   This document serves as a record of services personally performed by Tyler Pita, MD. It was created on his behalf by Derek Mound, a trained medical scribe. The creation of this record is based on the scribe's personal observations and the provider's statements to them. This document has been checked and approved by the attending provider.

## 2016-03-09 NOTE — Progress Notes (Signed)
Vitals stable. Denies pain. Oxygen therapy at 2 liters via nasal cannula. Pulse ox ranges from 96-97% on 2 liters. Patient denies dypnea.

## 2016-03-12 ENCOUNTER — Ambulatory Visit
Admission: RE | Admit: 2016-03-12 | Discharge: 2016-03-12 | Disposition: A | Discharge status: Home / Self Care | Payer: Medicare Other | Source: Ambulatory Visit | Attending: Radiation Oncology | Admitting: Radiation Oncology

## 2016-03-12 ENCOUNTER — Ambulatory Visit: Payer: Medicare Other

## 2016-03-12 ENCOUNTER — Encounter: Payer: Self-pay | Admitting: Radiation Oncology

## 2016-03-12 DIAGNOSIS — Z510 Encounter for antineoplastic radiation therapy: Secondary | ICD-10-CM | POA: Diagnosis not present

## 2016-03-12 MED ORDER — SUCRALFATE 1 G PO TABS: 1.0000 g | ORAL_TABLET | Freq: Three times a day (TID) | ORAL | Status: DC

## 2016-03-12 NOTE — Progress Notes (Signed)
Received patient in the clinic following radiation treatment. Patient requesting to be evaluated for pain associated with swallowing. Vitals stable. Weight loss noted. Oxygen therapy 4 liters via nasal cannula noted. Patient reports he was increased from 2 liters over the weekend to 4 liters because he "needed it" to breath. Hyperpigmentation of anterior neck noted. Patient denies using Sonafine as directed. Educated patient again the importance of apply sonafine before bed to sooth skin within treatment field. Patient verbalized understanding.   BP 101/64 mmHg  Pulse 79  Resp 16  Wt 138 lb (62.596 kg)  SpO2 100% Wt Readings from Last 3 Encounters:  03/12/16 138 lb (62.596 kg)  03/01/16 142 lb 11.2 oz (64.728 kg)  02/23/16 147 lb 12.8 oz (67.042 kg)

## 2016-03-13 ENCOUNTER — Ambulatory Visit: Payer: Medicare Other

## 2016-03-13 ENCOUNTER — Ambulatory Visit
Admission: RE | Admit: 2016-03-13 | Discharge: 2016-03-13 | Disposition: A | Discharge status: Home / Self Care | Payer: Medicare Other | Source: Ambulatory Visit | Attending: Radiation Oncology | Admitting: Radiation Oncology

## 2016-03-13 DIAGNOSIS — Z510 Encounter for antineoplastic radiation therapy: Secondary | ICD-10-CM | POA: Diagnosis not present

## 2016-03-14 ENCOUNTER — Ambulatory Visit: Payer: Medicare Other

## 2016-03-14 ENCOUNTER — Encounter: Payer: Self-pay | Admitting: Radiation Oncology

## 2016-03-14 ENCOUNTER — Ambulatory Visit
Admission: RE | Admit: 2016-03-14 | Discharge: 2016-03-14 | Disposition: A | Discharge status: Home / Self Care | Payer: Medicare Other | Source: Ambulatory Visit | Attending: Radiation Oncology | Admitting: Radiation Oncology

## 2016-03-14 DIAGNOSIS — Z510 Encounter for antineoplastic radiation therapy: Secondary | ICD-10-CM | POA: Diagnosis not present

## 2016-03-15 ENCOUNTER — Ambulatory Visit: Payer: Medicare Other

## 2016-03-16 ENCOUNTER — Ambulatory Visit: Payer: Medicare Other

## 2016-03-19 ENCOUNTER — Ambulatory Visit: Payer: Medicare Other

## 2016-03-20 ENCOUNTER — Ambulatory Visit: Payer: Medicare Other

## 2016-03-21 ENCOUNTER — Ambulatory Visit: Payer: Medicare Other

## 2016-03-22 ENCOUNTER — Ambulatory Visit: Payer: Medicare Other

## 2016-03-23 ENCOUNTER — Ambulatory Visit: Payer: Medicare Other

## 2016-03-26 NOTE — Progress Notes (Signed)
  Radiation Oncology         [phone] ________________________________  Name: Chad Wang MRN: [id number]  Date: 03/14/2016  DOB: [birth year]  End of Treatment Note  ICD-9-CM ICD-10-CM     1. Primary cancer of left upper lobe of lung ([place]) 162.3 C34.12     DIAGNOSIS: 60 year-old gentleman with epiglottic tumor and LUL mass     Indication for treatment:  Curative       Radiation treatment dates:   02/03/2016-03/14/2016  Site/dose:    1.  Initially on an emergent basis the Lung was treated to 6 Gy in one fraction 2.  Then, on to initiate therapy, treatment was started to the LUL with 18 Gy more at 6 fractions of 3 Gy  3.  Based on the opening of the Left Lung, treatment was replanned and the LUL and epiglottis were treated to 32.5 Gy with 13 more fractions of 2.5 Gy 4.  Finally, the LUL and epiglottis received another 21 Gy in 7 fractions of 3 Gy  Beams/energy:    1.  Initially on an emergent basis the Lung was treated with 3 fields on linac 2.  Then, on to initiate therapy, treatment was started to the LUL  with 3 fields on linac 3.  Based on the opening of the Left Lung, treatment was replanned and the LUL and epiglottis were treated on Tomotherapy 4.  Finally, the LUL and epiglottis on Tomotherapy with 6 MV  Narrative: The patient tolerated radiation treatment relatively well.   He was extubated after the initial treatment and ultimately responded well.  Plan: The patient has completed radiation treatment. The patient will return to radiation oncology clinic for routine followup in one month. I advised him to call or return sooner if he has any questions or concerns related to his recovery or treatment. ________________________________  Sheral Apley. Tammi Klippel, M.D.

## 2016-03-27 ENCOUNTER — Ambulatory Visit: Payer: Medicare Other

## 2016-03-28 ENCOUNTER — Ambulatory Visit: Payer: Medicare Other

## 2016-03-29 ENCOUNTER — Ambulatory Visit: Payer: Medicare Other

## 2016-03-30 ENCOUNTER — Ambulatory Visit: Payer: Medicare Other

## 2016-04-17 ENCOUNTER — Other Ambulatory Visit (HOSPITAL_BASED_OUTPATIENT_CLINIC_OR_DEPARTMENT_OTHER): Payer: Medicare Other

## 2016-04-17 DIAGNOSIS — C3412 Malignant neoplasm of upper lobe, left bronchus or lung: Secondary | ICD-10-CM | POA: Diagnosis not present

## 2016-04-17 DIAGNOSIS — C3491 Malignant neoplasm of unspecified part of right bronchus or lung: Secondary | ICD-10-CM

## 2016-04-17 LAB — COMPREHENSIVE METABOLIC PANEL
ALBUMIN: 3.5 g/dL (ref 3.5–5.0)
ALK PHOS: 85 U/L (ref 40–150)
ALT: <9 U/L (ref 0–55)
AST: 11 U/L (ref 5–34)
Anion Gap: 10 mEq/L (ref 3–11)
BUN: 6 mg/dL — ABNORMAL LOW (ref 7.0–26.0)
CO2: 21 mEq/L — ABNORMAL LOW (ref 22–29)
CREATININE: 0.7 mg/dL (ref 0.7–1.3)
Calcium: 9.7 mg/dL (ref 8.4–10.4)
Chloride: 100 mEq/L (ref 98–109)
GLUCOSE: 129 mg/dL (ref 70–140)
Potassium: 4.8 mEq/L (ref 3.5–5.1)
SODIUM: 132 meq/L — AB (ref 136–145)
TOTAL PROTEIN: 6.8 g/dL (ref 6.4–8.3)

## 2016-04-17 LAB — CBC WITH DIFFERENTIAL/PLATELET
BASO%: 0.5 % (ref 0.0–2.0)
BASOS ABS: 0 10*3/uL (ref 0.0–0.1)
EOS%: 2.5 % (ref 0.0–7.0)
Eosinophils Absolute: 0.1 10*3/uL (ref 0.0–0.5)
HCT: 36.2 % — ABNORMAL LOW (ref 38.4–49.9)
HEMOGLOBIN: 12.6 g/dL — AB (ref 13.0–17.1)
LYMPH%: 9.5 % — ABNORMAL LOW (ref 14.0–49.0)
MCH: 30.5 pg (ref 27.2–33.4)
MCHC: 34.8 g/dL (ref 32.0–36.0)
MCV: 87.7 fL (ref 79.3–98.0)
MONO#: 0.6 10*3/uL (ref 0.1–0.9)
MONO%: 14.3 % — ABNORMAL HIGH (ref 0.0–14.0)
NEUT#: 2.9 10*3/uL (ref 1.5–6.5)
NEUT%: 73.2 % (ref 39.0–75.0)
NRBC: 0 % (ref 0–0)
PLATELETS: 222 10*3/uL (ref 140–400)
RBC: 4.13 10*6/uL — ABNORMAL LOW (ref 4.20–5.82)
RDW: 17 % — ABNORMAL HIGH (ref 11.0–14.6)
WBC: 4 10*3/uL (ref 4.0–10.3)
lymph#: 0.4 10*3/uL — ABNORMAL LOW (ref 0.9–3.3)

## 2016-04-18 ENCOUNTER — Ambulatory Visit (HOSPITAL_COMMUNITY)
Admission: RE | Admit: 2016-04-18 | Discharge: 2016-04-18 | Disposition: A | Discharge status: Home / Self Care | Payer: Medicare Other | Source: Ambulatory Visit | Attending: Oncology | Admitting: Oncology

## 2016-04-18 DIAGNOSIS — I700 Atherosclerosis of aorta: Secondary | ICD-10-CM | POA: Diagnosis not present

## 2016-04-18 DIAGNOSIS — N3289 Other specified disorders of bladder: Secondary | ICD-10-CM | POA: Diagnosis not present

## 2016-04-18 DIAGNOSIS — C3412 Malignant neoplasm of upper lobe, left bronchus or lung: Secondary | ICD-10-CM | POA: Diagnosis present

## 2016-04-18 DIAGNOSIS — K8020 Calculus of gallbladder without cholecystitis without obstruction: Secondary | ICD-10-CM | POA: Insufficient documentation

## 2016-04-18 DIAGNOSIS — I2510 Atherosclerotic heart disease of native coronary artery without angina pectoris: Secondary | ICD-10-CM | POA: Insufficient documentation

## 2016-04-18 DIAGNOSIS — C3491 Malignant neoplasm of unspecified part of right bronchus or lung: Secondary | ICD-10-CM | POA: Insufficient documentation

## 2016-04-18 LAB — GLUCOSE, CAPILLARY: GLUCOSE-CAPILLARY: 107 mg/dL — AB (ref 65–99)

## 2016-04-18 MED ORDER — FLUDEOXYGLUCOSE F - 18 (FDG) INJECTION
6.7400 | Freq: Once | INTRAVENOUS | Status: AC | PRN
  Administered 2016-04-18: 6.74 via INTRAVENOUS

## 2016-04-19 ENCOUNTER — Ambulatory Visit
Admission: RE | Admit: 2016-04-19 | Discharge: 2016-04-19 | Disposition: A | Discharge status: Home / Self Care | Payer: Medicare Other | Source: Ambulatory Visit | Attending: Oncology | Admitting: Oncology

## 2016-04-19 ENCOUNTER — Ambulatory Visit (HOSPITAL_BASED_OUTPATIENT_CLINIC_OR_DEPARTMENT_OTHER): Payer: Medicare Other | Admitting: Oncology

## 2016-04-19 ENCOUNTER — Encounter: Payer: Self-pay | Admitting: Radiation Oncology

## 2016-04-19 ENCOUNTER — Telehealth: Payer: Self-pay | Admitting: Oncology

## 2016-04-19 ENCOUNTER — Ambulatory Visit
Admission: RE | Admit: 2016-04-19 | Discharge: 2016-04-19 | Disposition: A | Discharge status: Home / Self Care | Payer: Medicare Other | Source: Ambulatory Visit | Attending: Radiation Oncology | Admitting: Radiation Oncology

## 2016-04-19 VITALS — BP 123/72 | HR 85 | Temp 97.8°F | Resp 16 | Ht 70.0 in | Wt 143.1 lb

## 2016-04-19 VITALS — BP 121/75 | HR 89 | Temp 97.7°F | Resp 16 | Wt 143.4 lb

## 2016-04-19 DIAGNOSIS — C329 Malignant neoplasm of larynx, unspecified: Secondary | ICD-10-CM | POA: Diagnosis not present

## 2016-04-19 DIAGNOSIS — Z87891 Personal history of nicotine dependence: Secondary | ICD-10-CM

## 2016-04-19 DIAGNOSIS — C321 Malignant neoplasm of supraglottis: Secondary | ICD-10-CM

## 2016-04-19 DIAGNOSIS — C3492 Malignant neoplasm of unspecified part of left bronchus or lung: Secondary | ICD-10-CM

## 2016-04-19 DIAGNOSIS — C3412 Malignant neoplasm of upper lobe, left bronchus or lung: Secondary | ICD-10-CM

## 2016-04-19 DIAGNOSIS — C3491 Malignant neoplasm of unspecified part of right bronchus or lung: Secondary | ICD-10-CM

## 2016-04-19 DIAGNOSIS — J387 Other diseases of larynx: Secondary | ICD-10-CM

## 2016-04-19 NOTE — Telephone Encounter (Signed)
Gave and printed appt sched and avs for pt for Sept °

## 2016-04-19 NOTE — Progress Notes (Addendum)
Vitals stable. Denies pain. Reports swallowing continues to improving. Confirms he does continue to use Carafate. Weight gain noted. Patient confirms appetite has improved greatly. Patient living independently again. Reports taking out his trash without oxygen. Reports climbing the stairs regularly to use his upstairs bathroom. Continuous oxygen therapy 2.5 liters noted. Reports participating in speech therapy once per week and physical therapy twice per week. Reports productive cough with clear sputum. Denies hemoptysis. Reports stamina has greatly improved. Reports shortness of breath with exertion is less. Voice has returned. No hoarseness noted. Faint hyperpigmentation without desquamation of right anterior neck noted. Encouraged patient to continue to apply lotion regularly to affect area so hyperpigmentation will continue to fade. Patient verbalized understanding.   BP 121/75 mmHg  Pulse 89  Temp(Src) 97.7 F (36.5 C) (Oral)  Resp 16  Wt 143 lb 6.4 oz (65.046 kg)  SpO2 100% Wt Readings from Last 3 Encounters:  04/19/16 143 lb 6.4 oz (65.046 kg)  03/12/16 138 lb (62.596 kg)  03/01/16 142 lb 11.2 oz (64.728 kg)

## 2016-04-19 NOTE — Progress Notes (Signed)
Hematology and Oncology Follow Up Visit  Chad Wang [id number] 1956-05-08 60 y.o. 04/19/2016 1:10 PM No PCP Per PatientNo ref. provider found   Principle Diagnosis: 60 year old gentleman with the following diagnoses:   1. 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 This was diagnosed on 01/18/2016. 2. Squamous cell carcinoma of the lung presented with an endobronchial lesion and left lower lung collapse. In March 2017.   Prior Therapy:  He is status post bronchoscopy and laryngitis daily and biopsy of both these lesions in March 2017. He is status post definitive radiation to the larynx and the left lung completed in May 2017.  Current therapy: Observation and surveillance.  Interim History:  Chad Wang presents today for a follow-up visit with his sister. Since the last visit, he completed radiation therapy without any major complications. He is feeling well and his breathing is much improved. He still requiring oxygen down to 2 L instead of 6. He is ambulating without any major difficulties. He does report some periodic cough but no hemoptysis. He denied any dysphagia or painful swallowing. He is able to eat better and have gained weight.   He does not report any headaches, blurry vision, syncope or seizures. He does not report any fevers chills or sweats. Does not report any chest pain, palpitation or orthopnea. Does not report any hemoptysis but does report some wheezing and dyspnea on exertion. Does not report any nausea, vomiting or abdominal pain. Does not report any frequency urgency or has a. Does not report any skeletal complaints. Remaining review of systems unremarkable.  Medications: I have reviewed the patient's current medications.  Current Outpatient Prescriptions  Medication Sig Dispense Refill  . albuterol (PROVENTIL HFA;VENTOLIN HFA) 108 (90 Base) MCG/ACT inhaler Inhale 2 puffs into the lungs every 6 (six) hours  as needed for wheezing or shortness of breath. 1 Inhaler 2  . chlorpheniramine-HYDROcodone (TUSSIONEX) 10-8 MG/5ML SUER Take 5 mLs by mouth every 12 (twelve) hours. 250 mL 0  . cloZAPine (CLOZARIL) 100 MG tablet Take 100 mg by mouth at bedtime.     . divalproex (DEPAKOTE) 500 MG DR tablet Take 500-1,000 mg by mouth 2 (two) times daily. Takes '500mg'$  in am and '1000mg'$  in pm    . docusate sodium (COLACE) 100 MG capsule Take 1 capsule (100 mg total) by mouth 2 (two) times daily. 10 capsule 0  . feeding supplement, GLUCERNA SHAKE, (GLUCERNA SHAKE) LIQD Take 237 mLs by mouth 2 (two) times daily between meals. 237 mL 30  . gemfibrozil (LOPID) 600 MG tablet Take 600 mg by mouth 2 (two) times daily.    . metFORMIN (GLUCOPHAGE) 1000 MG tablet Take 500 mg by mouth 2 (two) times daily with a meal.     . mometasone-formoterol (DULERA) 200-5 MCG/ACT AERO Inhale 2 puffs into the lungs 2 (two) times daily. 1 Inhaler 2  . sucralfate (CARAFATE) 1 g tablet Take 1 tablet (1 g total) by mouth 4 (four) times daily -  with meals and at bedtime. 120 tablet 0  . Wound Cleansers (RADIAPLEX EX) Apply topically. Reported on 04/19/2016    . Wound Dressings (SONAFINE) Apply 1 application topically 3 (three) times daily. Reported on 04/19/2016     No current facility-administered medications for this visit.     Allergies: No Known Allergies  Past Medical History, Surgical history, Social history, and Family History were reviewed and updated.   Physical Exam: Blood pressure 123/72, pulse 85, temperature 97.8 F (  36.6 C), temperature source Oral, resp. rate 16, height '5\' 10"'$  (1.778 m), weight 143 lb 1.6 oz (64.91 kg), SpO2 100 %. ECOG: 2 General appearance: alert and cooperative appeared without distress. Head: Normocephalic, without obvious abnormality Neck: no adenopathy Lymph nodes: Cervical, supraclavicular, and axillary nodes normal. Heart:regular rate and rhythm, S1, S2 normal, no murmur, click, rub or gallop Lung:  Clear bilaterally with expiratory wheezes. Good air sounds on the left side. Abdomin: soft, non-tender, without masses or organomegaly EXT:no erythema, induration, or nodules   Lab Results: Lab Results  Component Value Date   WBC 4.0 04/17/2016   HGB 12.6* 04/17/2016   HCT 36.2* 04/17/2016   MCV 87.7 04/17/2016   PLT 222 04/17/2016     Chemistry      Component Value Date/Time   NA 132* 04/17/2016 0911   NA 137 02/06/2016 0540   K 4.8 04/17/2016 0911   K 4.3 02/06/2016 0540   CL 104 02/06/2016 0540   CO2 21* 04/17/2016 0911   CO2 25 02/06/2016 0540   BUN 6.0* 04/17/2016 0911   BUN 15 02/06/2016 0540   CREATININE 0.7 04/17/2016 0911   CREATININE 0.53* 02/06/2016 0540      Component Value Date/Time   CALCIUM 9.7 04/17/2016 0911   CALCIUM 8.8* 02/06/2016 0540   ALKPHOS 85 04/17/2016 0911   ALKPHOS 66 01/31/2016 2242   AST 11 04/17/2016 0911   AST 39 01/31/2016 2242   ALT <9 04/17/2016 0911   ALT 14* 01/31/2016 2242   BILITOT <0.30 04/17/2016 0911   BILITOT 1.2 01/31/2016 2242     CLINICAL DATA: Initial treatment strategy for lung cancer. History of the pharyngeal mass.  EXAM: NUCLEAR MEDICINE PET SKULL BASE TO THIGH  TECHNIQUE: 6.7 mCi F-18 FDG was injected intravenously. Full-ring PET imaging was performed from the skull base to thigh after the radiotracer. CT data was obtained and used for attenuation correction and anatomic localization.  FASTING BLOOD GLUCOSE: Value: 107 mg/dl  COMPARISON: CT 01/20/2016 and CT chest 01/18/2016.  FINDINGS: NECK  No hypermetabolic lymph nodes in the neck. A supraglottic laryngeal mass seen on 01/20/2016 is no longer identified there is no associated abnormal hypermetabolism in this area. CT images show no acute findings. Mild cerebral atrophy and cranial settling.  CHEST  Posterior left upper lobe mass measures 2.4 x 3.3 cm (CT image 27) with an SUV max of 11.3, previously measuring 3.4 x 4.0 cm  on 01/18/2016. Small area of ill-defined ground-glass/consolidation posterior to the proximal descending thoracic aorta, within the superior segment left lower lobe (CT image 30), is mildly hypermetabolic. Finding is nonspecific and may represent the residua of previously seen complete collapse of the left lower lobe on 01/18/2016. Mild residual left hilar hypermetabolism, with an SUV max of 4.9. A discrete lesion is difficult to resolve on CT images without IV contrast. No hypermetabolic mediastinal or axillary lymph nodes. CT images show coronary artery calcification. No pericardial or pleural effusion. Centrilobular emphysema.  ABDOMEN/PELVIS  No abnormal hypermetabolism in the liver, adrenal glands, spleen or pancreas. No hypermetabolic lymph nodes. Mild hypermetabolism associated with soft tissue adjacent to the right inferior pubic ramus (CT image 211) is nonspecific and may be degenerative/posttraumatic in etiology, given adjacent osseous proliferation (CT image 201). Liver is unremarkable. Small stones are seen in the gallbladder. Adrenal glands, kidneys, spleen, pancreas, stomach and bowel are grossly unremarkable with the caveat of respiratory motion. Tiny pelvic free fluid. Bladder wall appears somewhat thickened but the bladder is under distended.  Aortic atherosclerosis.  SKELETON  No abnormal osseous hypermetabolism. Degenerative changes are seen in the spine and sacroiliac joints.  IMPRESSION: 1. Near complete resolution of a left perihilar mass, with mild residual hypermetabolism and no discrete measurable lesion on noncontrast CT images. Associated re-expansion of the left lower lobe with likely residual inflammatory ground-glass in the superior segment. Continued attention on followup exams is warranted as residual disease cannot be definitively excluded. 2. Hypermetabolic primary left upper lobe mass, slightly decreased in size. 3. Supraglottic laryngeal  mass seen and described on 02/16/2016 is no longer visualized and there is no residual hypermetabolism. 4. Possibly degenerative/post-traumatic soft tissue uptake adjacent to the right inferior pubic ramus/right ischium. Continued attention on followup exams is warranted. 5. Coronary artery calcification and aortic atherosclerosis. 6. Cholelithiasis. 7. Bladder wall appears somewhat thickened but the bladder is under distended.     Impression and Plan:   60 year old gentleman with the following issues:  1. Squamous cell carcinoma of the larynx: He presented with hoarseness and a 2.3 cm supraglottic laryngeal mass. He does have significant smoking history which might have contributed to this etiology. His CT scan of the neck did not show any evidence of obvious lymphadenopathy. This was diagnosed in March 2017.  He completed definitive radiation therapy with complete resolution of his tumor. Observation surveillance is recommended at this time.  2. Squamous cell carcinoma of the lung presenting with endobronchial lesion and left lower lung collapse. No evidence of clear-cut metastasis in the thorax or any regional lymphadenopathy.   He is status post definitive radiation to the thorax and his PET CT scan obtained on 04/18/2016 was reviewed today. He has no evidence of metastatic disease or systemic disease at this time. He is left lung have completely inflated and no residual tumor noted except for a left lung mass in the upper lobe which have decreased in size.  Options of treatment were reviewed today. Systemic chemotherapy could be an option but not ideal given the lack of systemic disease. His tumor did not show PDL 1 mutation indicating possible lack of response to immunotherapy. I recommended observation and surveillance and I will discuss these findings with Dr. Tammi Klippel. Possibly additional stereotactic body radiosurgery may be an option for that residual tumor.  3. Dyspnea on  exertion: Seems to be improving as is left lung aeration is normal at this time.  4. Prognosis remains guarded at this time although he has potentially 2 incurable malignancies is very unlikely that treatment will be curative.  5. Follow-up: Will be in 3 months for repeat imaging studies.    [name], MD 6/22/20171:10 PM

## 2016-04-19 NOTE — Addendum Note (Signed)
Addended by: Wyatt Portela on: 04/19/2016 04:03 PM   Modules accepted: Orders

## 2016-04-19 NOTE — Progress Notes (Signed)
Radiation Oncology         [phone] ________________________________  Name: Chad Wang MRN: [id number]  Date: 04/19/2016  DOB: 01-11-1956     Follow-Up Visit Note  CC: No PCP Per Patient  Chad Mires, MD  Diagnosis:   60 year-old gentleman with epiglottic and LUL squamous cell carcinoma    ICD-9-CM ICD-10-CM   1. Laryngeal mass 478.79 J38.7   2. 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 Squamous cell lung cancer, left ([facility]) 162.9 C34.92     Interval Since Last Radiation:  1 month   02/13/16-03/14/2016: 50 Gy to the left upper lobe and larynx in 20 fractions  Narrative:  The patient returns today for routine follow-up. This is been able to live independently since his last visit, and has been able to exert himself including taking of the fascia without becoming short of breath. His voice has returned to baseline for his family. He denies any shortness of breath or chest pain. He continues to remain on 2-1/2 L by nasal cannula, but when he initially started on 6 L continuous. He denies any fevers or chills. He is noticing a persistent cough but this is less than significantly in severity and frequency. No other complaints or verbalized.                             ALLERGIES:  has No Known Allergies.  Meds: Current Outpatient Prescriptions  Medication Sig Dispense Refill  . albuterol (PROVENTIL HFA;VENTOLIN HFA) 108 (90 Base) MCG/ACT inhaler Inhale 2 puffs into the lungs every 6 (six) hours as needed for wheezing or shortness of breath. 1 Inhaler 2  . chlorpheniramine-HYDROcodone (TUSSIONEX) 10-8 MG/5ML SUER Take 5 mLs by mouth every 12 (twelve) hours. 250 mL 0  . cloZAPine (CLOZARIL) 100 MG tablet Take 100 mg by mouth at bedtime.     . divalproex (DEPAKOTE) 500 MG DR tablet Take 500-1,000 mg by mouth 2 (two) times daily. Takes '500mg'$  in am and '1000mg'$  in pm    . docusate sodium (COLACE) 100 MG capsule Take 1 capsule (100 mg total) by mouth 2 (two) times  daily. 10 capsule 0  . feeding supplement, GLUCERNA SHAKE, (GLUCERNA SHAKE) LIQD Take 237 mLs by mouth 2 (two) times daily between meals. 237 mL 30  . gemfibrozil (LOPID) 600 MG tablet Take 600 mg by mouth 2 (two) times daily.    . metFORMIN (GLUCOPHAGE) 1000 MG tablet Take 500 mg by mouth 2 (two) times daily with a meal.     . mometasone-formoterol (DULERA) 200-5 MCG/ACT AERO Inhale 2 puffs into the lungs 2 (two) times daily. 1 Inhaler 2  . sucralfate (CARAFATE) 1 g tablet Take 1 tablet (1 g total) by mouth 4 (four) times daily -  with meals and at bedtime. 120 tablet 0  . Wound Cleansers (RADIAPLEX EX) Apply topically. Reported on 04/19/2016    . Wound Dressings (SONAFINE) Apply 1 application topically 3 (three) times daily. Reported on 04/19/2016     No current facility-administered medications for this encounter.    Physical Findings:  weight is 143 lb 6.4 oz (65.046 kg). His oral temperature is 97.7 F (36.5 C). His blood pressure is 121/75 and his pulse is 89. His respiration is 16 and oxygen saturation is 100%.  on 2.5 L [place] In general this is a well appearing caucasian male in no acute distress. He is alert and oriented x4 and  appropriate throughout the examination. Chest is clear to auscultation bilaterally. His voice is no longer hoarse.    Lab Findings: Lab Results  Component Value Date   WBC 4.0 04/17/2016   WBC 9.6 02/05/2016   HGB 12.6* 04/17/2016   HGB 13.7 02/05/2016   HCT 36.2* 04/17/2016   HCT 40.1 02/05/2016   PLT 222 04/17/2016   PLT 224 02/05/2016    Lab Results  Component Value Date   NA 132* 04/17/2016   NA 137 02/06/2016   K 4.8 04/17/2016   K 4.3 02/06/2016   CHLORIDE 100 04/17/2016   CO2 21* 04/17/2016   CO2 25 02/06/2016   GLUCOSE 129 04/17/2016   GLUCOSE 148* 02/06/2016   BUN 6.0* 04/17/2016   BUN 15 02/06/2016   CREATININE 0.7 04/17/2016   CREATININE 0.53* 02/06/2016   BILITOT <0.30 04/17/2016   BILITOT 1.2 01/31/2016   ALKPHOS 85 04/17/2016     ALKPHOS 66 01/31/2016   AST 11 04/17/2016   AST 39 01/31/2016   ALT <9 04/17/2016   ALT 14* 01/31/2016   PROT 6.8 04/17/2016   PROT 5.5* 01/31/2016   ALBUMIN 3.5 04/17/2016   ALBUMIN 3.1* 02/05/2016   CALCIUM 9.7 04/17/2016   CALCIUM 8.8* 02/06/2016   ANIONGAP 10 04/17/2016   ANIONGAP 8 02/06/2016    Radiographic Findings: Nm Pet Image Initial (pi) Skull Base To Thigh  04/18/2016  CLINICAL DATA:  Initial treatment strategy for lung cancer. History of the pharyngeal mass. EXAM: NUCLEAR MEDICINE PET SKULL BASE TO THIGH TECHNIQUE: 6.7 mCi F-18 FDG was injected intravenously. Full-ring PET imaging was performed from the skull base to thigh after the radiotracer. CT data was obtained and used for attenuation correction and anatomic localization. FASTING BLOOD GLUCOSE:  Value: 107 mg/dl COMPARISON:  CT 01/20/2016 and CT chest 01/18/2016. FINDINGS: NECK No hypermetabolic lymph nodes in the neck. A supraglottic laryngeal mass seen on 01/20/2016 is no longer identified there is no associated abnormal hypermetabolism in this area. CT images show no acute findings. Mild cerebral atrophy and cranial settling. CHEST Posterior left upper lobe mass measures 2.4 x 3.3 cm (CT image 27) with an SUV max of 11.3, previously measuring 3.4 x 4.0 cm on 01/18/2016. Small area of ill-defined ground-glass/consolidation posterior to the proximal descending thoracic aorta, within the superior segment left lower lobe (CT image 30), is mildly hypermetabolic. Finding is nonspecific and may represent the residua of previously seen complete collapse of the left lower lobe on 01/18/2016. Mild residual left hilar hypermetabolism, with an SUV max of 4.9. A discrete lesion is difficult to resolve on CT images without IV contrast. No hypermetabolic mediastinal or axillary lymph nodes. CT images show coronary artery calcification. No pericardial or pleural effusion. Centrilobular emphysema. ABDOMEN/PELVIS No abnormal hypermetabolism  in the liver, adrenal glands, spleen or pancreas. No hypermetabolic lymph nodes. Mild hypermetabolism associated with soft tissue adjacent to the right inferior pubic ramus (CT image 211) is nonspecific and may be degenerative/posttraumatic in etiology, given adjacent osseous proliferation (CT image 201). Liver is unremarkable. Small stones are seen in the gallbladder. Adrenal glands, kidneys, spleen, pancreas, stomach and bowel are grossly unremarkable with the caveat of respiratory motion. Tiny pelvic free fluid. Bladder wall appears somewhat thickened but the bladder is under distended. Aortic atherosclerosis. SKELETON No abnormal osseous hypermetabolism. Degenerative changes are seen in the spine and sacroiliac joints. IMPRESSION: 1. Near complete resolution of a left perihilar mass, with mild residual hypermetabolism and no discrete measurable lesion on noncontrast CT images. Associated  re-expansion of the left lower lobe with likely residual inflammatory ground-glass in the superior segment. Continued attention on followup exams is warranted as residual disease cannot be definitively excluded. 2. Hypermetabolic primary left upper lobe mass, slightly decreased in size. 3. Supraglottic laryngeal mass seen and described on 02/16/2016 is no longer visualized and there is no residual hypermetabolism. 4. Possibly degenerative/post-traumatic soft tissue uptake adjacent to the right inferior pubic ramus/right ischium. Continued attention on followup exams is warranted. 5. Coronary artery calcification and aortic atherosclerosis. 6. Cholelithiasis. 7. Bladder wall appears somewhat thickened but the bladder is under distended. Electronically Signed   By: Lorin Picket M.D.   On: 04/18/2016 10:53    Impression:  The patient is recovering from the effects of radiation of his dual primaries.   Plan: The patient will be seeing person on today to discuss the findings from his PET scan, and to consider systemic  therapy. We will follow up with the patient on an as needed basis moving forward, but would be happy to see him back if there are any further questions or concerns regarding his radiotherapy. He and his sister states agreement and understanding.      Carola Rhine, PAC

## 2016-04-24 NOTE — Addendum Note (Signed)
Encounter addended by: Benn Moulder, RN on: 04/24/2016  5:34 PM<BR>     Documentation filed: Demographics Visit

## 2016-06-08 ENCOUNTER — Telehealth: Payer: Self-pay | Admitting: Radiation Oncology

## 2016-06-08 NOTE — Telephone Encounter (Signed)
Received a return call message from patient's sister, Maudie Mercury. She reports concern about swelling in the center of the patient's neck that comes and goes. She confirms the area in questions is soft and painless. She reports her brother denies this area is painful or causing difficulty swallowing. She reports her brother is doing very well otherwise. Even reports he went to the beach with them last weekend and ate very well. She explains he only requires oxygen therapy when he sleeps now. Kim understands this RN will contact her back on Monday morning with further direction after consulting the providers. Kim simply questions if she should be concerned about this area. Patient scheduled for CT chest on 9/19 and follow up with Shadad on 9/21.

## 2016-06-08 NOTE — Telephone Encounter (Signed)
Received outlook email from Shona Simpson, PA-C requesting this RN call the patient's sister to inquire about needs. Phoned [name], sister, on her cell and at home. No answer at either. Left message requesting return call.

## 2016-06-12 ENCOUNTER — Other Ambulatory Visit: Payer: Self-pay | Admitting: Radiation Oncology

## 2016-06-12 ENCOUNTER — Telehealth: Payer: Self-pay | Admitting: Radiation Oncology

## 2016-06-12 DIAGNOSIS — C3492 Malignant neoplasm of unspecified part of left bronchus or lung: Secondary | ICD-10-CM

## 2016-06-12 NOTE — Telephone Encounter (Signed)
Phoned Joelene Millin to follow up on her brother, Nachman. She reports he continues to have intermittent swelling in his neck. Explained an ENT referral is going to be place by Shona Simpson, PA-C to either [facility] (who did his surgery) or [facility] (saw him while hospitalized). Explained the swelling should be evaluated by an ENT plus routine follow up with an ENT needed to be established. Joelene Millin verbalized understanding. She understands Enid Derry will call her with the appointment.

## 2016-06-18 ENCOUNTER — Telehealth: Payer: Self-pay | Admitting: *Deleted

## 2016-06-18 NOTE — Telephone Encounter (Signed)
"  I am the sister and health P.O.A calling for help.  My brother can't speak for himself because his neck is swelling, throat is sore, almost like a thyroid problem.  He's hoarse and this comes and goes.  I've called RT and a referral to ENT has been made but I can't get a return call from ENT.  He received RT to this area and these are the same symptoms when cancer started.  I think he needs scans sooner and to be seen sooner than 07-19-2016.  My return number is [phone] or my mobile is [phone]."

## 2016-06-19 ENCOUNTER — Other Ambulatory Visit: Payer: Self-pay | Admitting: Oncology

## 2016-06-19 ENCOUNTER — Telehealth: Payer: Self-pay | Admitting: Radiation Oncology

## 2016-06-19 ENCOUNTER — Telehealth: Payer: Self-pay | Admitting: *Deleted

## 2016-06-19 ENCOUNTER — Encounter: Payer: Self-pay | Admitting: *Deleted

## 2016-06-19 DIAGNOSIS — C321 Malignant neoplasm of supraglottis: Secondary | ICD-10-CM

## 2016-06-19 DIAGNOSIS — C3412 Malignant neoplasm of upper lobe, left bronchus or lung: Secondary | ICD-10-CM

## 2016-06-19 DIAGNOSIS — C3492 Malignant neoplasm of unspecified part of left bronchus or lung: Secondary | ICD-10-CM

## 2016-06-19 NOTE — Telephone Encounter (Signed)
I spoke with the patient's sister [name] and we will coordinate the ENT referral, and check on when this visit is to occur, the patient's sister has not heard back yet on this appt. 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 We will follow up with the results of this when available.

## 2016-06-19 NOTE — Telephone Encounter (Signed)
Per Dr. Alen Blew, patient needs to be seen by ENT. Dr. Alen Blew scans have been moved up to this week. Kim verbalized understanding.

## 2016-06-19 NOTE — Telephone Encounter (Signed)
Scheduled an ENT visit on 9/6 with Dr. Janace Hoard at 1:30p and added on a CT of the neck scan for 8/24. Chad Wang is aware/agreeable to all. Shona Simpson, PA is aware.

## 2016-06-20 ENCOUNTER — Other Ambulatory Visit (HOSPITAL_BASED_OUTPATIENT_CLINIC_OR_DEPARTMENT_OTHER): Payer: Medicare Other

## 2016-06-20 DIAGNOSIS — C3412 Malignant neoplasm of upper lobe, left bronchus or lung: Secondary | ICD-10-CM

## 2016-06-20 LAB — CBC WITH DIFFERENTIAL/PLATELET
BASO%: 0.4 % (ref 0.0–2.0)
Basophils Absolute: 0 10*3/uL (ref 0.0–0.1)
EOS ABS: 0.2 10*3/uL (ref 0.0–0.5)
EOS%: 2.7 % (ref 0.0–7.0)
HCT: 38.1 % — ABNORMAL LOW (ref 38.4–49.9)
HGB: 13.7 g/dL (ref 13.0–17.1)
LYMPH#: 1.1 10*3/uL (ref 0.9–3.3)
LYMPH%: 12.7 % — ABNORMAL LOW (ref 14.0–49.0)
MCH: 30.4 pg (ref 27.2–33.4)
MCHC: 36 g/dL (ref 32.0–36.0)
MCV: 84.7 fL (ref 79.3–98.0)
MONO#: 0.5 10*3/uL (ref 0.1–0.9)
MONO%: 5.4 % (ref 0.0–14.0)
NEUT#: 6.5 10*3/uL (ref 1.5–6.5)
NEUT%: 78.8 % — AB (ref 39.0–75.0)
Platelets: 211 10*3/uL (ref 140–400)
RBC: 4.5 10*6/uL (ref 4.20–5.82)
RDW: 13.1 % (ref 11.0–14.6)
WBC: 8.3 10*3/uL (ref 4.0–10.3)

## 2016-06-20 LAB — COMPREHENSIVE METABOLIC PANEL
ALBUMIN: 3.8 g/dL (ref 3.5–5.0)
ALK PHOS: 77 U/L (ref 40–150)
ALT: <9 U/L (ref 0–55)
AST: 10 U/L (ref 5–34)
Anion Gap: 9 mEq/L (ref 3–11)
BUN: 8.7 mg/dL (ref 7.0–26.0)
CO2: 24 meq/L (ref 22–29)
Calcium: 10.8 mg/dL — ABNORMAL HIGH (ref 8.4–10.4)
Chloride: 99 mEq/L (ref 98–109)
Creatinine: 0.7 mg/dL (ref 0.7–1.3)
GLUCOSE: 116 mg/dL (ref 70–140)
POTASSIUM: 4.7 meq/L (ref 3.5–5.1)
SODIUM: 132 meq/L — AB (ref 136–145)
Total Bilirubin: 0.34 mg/dL (ref 0.20–1.20)
Total Protein: 7.1 g/dL (ref 6.4–8.3)

## 2016-06-21 ENCOUNTER — Encounter (HOSPITAL_COMMUNITY): Payer: Self-pay

## 2016-06-21 ENCOUNTER — Ambulatory Visit (HOSPITAL_COMMUNITY): Payer: Medicare Other

## 2016-06-21 ENCOUNTER — Ambulatory Visit (HOSPITAL_COMMUNITY)
Admission: RE | Admit: 2016-06-21 | Discharge: 2016-06-21 | Disposition: A | Discharge status: Home / Self Care | Payer: Medicare Other | Source: Ambulatory Visit | Attending: Oncology | Admitting: Oncology

## 2016-06-21 DIAGNOSIS — I700 Atherosclerosis of aorta: Secondary | ICD-10-CM | POA: Insufficient documentation

## 2016-06-21 DIAGNOSIS — C3492 Malignant neoplasm of unspecified part of left bronchus or lung: Secondary | ICD-10-CM

## 2016-06-21 DIAGNOSIS — I313 Pericardial effusion (noninflammatory): Secondary | ICD-10-CM | POA: Insufficient documentation

## 2016-06-21 DIAGNOSIS — R590 Localized enlarged lymph nodes: Secondary | ICD-10-CM | POA: Insufficient documentation

## 2016-06-21 DIAGNOSIS — I2510 Atherosclerotic heart disease of native coronary artery without angina pectoris: Secondary | ICD-10-CM | POA: Diagnosis not present

## 2016-06-21 DIAGNOSIS — C3412 Malignant neoplasm of upper lobe, left bronchus or lung: Secondary | ICD-10-CM | POA: Insufficient documentation

## 2016-06-21 DIAGNOSIS — J439 Emphysema, unspecified: Secondary | ICD-10-CM | POA: Diagnosis not present

## 2016-06-21 DIAGNOSIS — R918 Other nonspecific abnormal finding of lung field: Secondary | ICD-10-CM | POA: Insufficient documentation

## 2016-06-21 MED ORDER — IOPAMIDOL (ISOVUE-300) INJECTION 61%
100.0000 mL | Freq: Once | INTRAVENOUS | Status: AC | PRN
  Administered 2016-06-21: 100 mL via INTRAVENOUS

## 2016-06-29 ENCOUNTER — Telehealth: Payer: Self-pay | Admitting: Oncology

## 2016-06-29 ENCOUNTER — Ambulatory Visit (HOSPITAL_BASED_OUTPATIENT_CLINIC_OR_DEPARTMENT_OTHER): Payer: Medicare Other | Admitting: Oncology

## 2016-06-29 VITALS — BP 128/77 | HR 97 | Temp 97.5°F | Resp 16 | Ht 70.0 in | Wt 151.9 lb

## 2016-06-29 DIAGNOSIS — C321 Malignant neoplasm of supraglottis: Secondary | ICD-10-CM | POA: Diagnosis present

## 2016-06-29 DIAGNOSIS — C3412 Malignant neoplasm of upper lobe, left bronchus or lung: Secondary | ICD-10-CM | POA: Diagnosis not present

## 2016-06-29 MED ORDER — LIDOCAINE-PRILOCAINE 2.5-2.5 % EX CREA: 1.0000 "application " | TOPICAL_CREAM | CUTANEOUS | 0 refills | Status: DC | PRN

## 2016-06-29 MED ORDER — PROCHLORPERAZINE MALEATE 10 MG PO TABS: 10.0000 mg | ORAL_TABLET | Freq: Four times a day (QID) | ORAL | 0 refills | Status: DC | PRN

## 2016-06-29 NOTE — Telephone Encounter (Signed)
Gave patient avs report and appointments for September and October, including port placement for 9/15. Dr. Alen Blew aware port placement 9/15 and tx will remain as scheduled 9/13 - patient/family aware.

## 2016-06-29 NOTE — Telephone Encounter (Signed)
Gave patient avs report and appointments for September and October  °

## 2016-06-29 NOTE — Progress Notes (Signed)
Hematology and Oncology Follow Up Visit  Chad Wang [id number] [birth year] 60 y.o. 06/29/2016 1:50 PM No PCP Per PatientNo ref. provider found   Principle Diagnosis: 60 year old gentleman with the following diagnoses:   1. 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 This was diagnosed on 01/18/2016. 2. Squamous cell carcinoma of the lung presented with an endobronchial lesion and left lower lung collapse. In March 2017.   Prior Therapy:  He is status post bronchoscopy and laryngitis daily and biopsy of both these lesions in March 2017. He is status post definitive radiation to the larynx and the left lung completed in May 2017.  Current therapy: Consideration to start systemic chemotherapy.  Interim History:  Chad Wang presents today for a follow-up visit with his sister. Since the last visit, he reports feeling reasonably well. He did notice a swelling in his neck area and was evaluated by Dr. Redmond Baseman from ENT. His examination was unrevealing for any tumor recurrence. He reports very little cough or dyspnea on exertion at this time. He denied any wheezing or hemoptysis. His appetite is excellent and have gained some more weight. He saw [place] is also improving.    He does not report any headaches, blurry vision, syncope or seizures. He does not report any fevers chills or sweats. Does not report any chest pain, palpitation or orthopnea. Does not report any hemoptysis but does report some wheezing and dyspnea on exertion. Does not report any nausea, vomiting or abdominal pain. Does not report any frequency urgency or has a. Does not report any skeletal complaints. Remaining review of systems unremarkable.  Medications: I have reviewed the patient's current medications.  Current Outpatient Prescriptions  Medication Sig Dispense Refill  . albuterol (PROVENTIL HFA;VENTOLIN HFA) 108 (90 Base) MCG/ACT inhaler Inhale 2 puffs into the lungs  every 6 (six) hours as needed for wheezing or shortness of breath. 1 Inhaler 2  . chlorpheniramine-HYDROcodone (TUSSIONEX) 10-8 MG/5ML SUER Take 5 mLs by mouth every 12 (twelve) hours. 250 mL 0  . cloZAPine (CLOZARIL) 100 MG tablet Take 100 mg by mouth at bedtime.     . divalproex (DEPAKOTE) 500 MG DR tablet Take 500-1,000 mg by mouth 2 (two) times daily. Takes '500mg'$  in am and '1000mg'$  in pm    . docusate sodium (COLACE) 100 MG capsule Take 1 capsule (100 mg total) by mouth 2 (two) times daily. 10 capsule 0  . feeding supplement, GLUCERNA SHAKE, (GLUCERNA SHAKE) LIQD Take 237 mLs by mouth 2 (two) times daily between meals. 237 mL 30  . gemfibrozil (LOPID) 600 MG tablet Take 600 mg by mouth 2 (two) times daily.    Marland Kitchen lidocaine-prilocaine (EMLA) cream Apply 1 application topically as needed. Apply to port before chemotherapy. 30 g 0  . metFORMIN (GLUCOPHAGE) 1000 MG tablet Take 500 mg by mouth 2 (two) times daily with a meal.     . mometasone-formoterol (DULERA) 200-5 MCG/ACT AERO Inhale 2 puffs into the lungs 2 (two) times daily. 1 Inhaler 2  . prochlorperazine (COMPAZINE) 10 MG tablet Take 1 tablet (10 mg total) by mouth every 6 (six) hours as needed for nausea or vomiting. 30 tablet 0  . sucralfate (CARAFATE) 1 g tablet Take 1 tablet (1 g total) by mouth 4 (four) times daily -  with meals and at bedtime. 120 tablet 0  . Wound Cleansers (RADIAPLEX EX) Apply topically. Reported on 04/19/2016    . Wound Dressings (SONAFINE) Apply 1 application topically 3 (  three) times daily. Reported on 04/19/2016     No current facility-administered medications for this visit.      Allergies: No Known Allergies  Past Medical History, Surgical history, Social history, and Family History were reviewed and updated.   Physical Exam: Blood pressure 128/77, pulse 97, temperature 97.5 F (36.4 C), temperature source Oral, resp. rate 16, height '5\' 10"'$  (1.778 m), weight 151 lb 14.4 oz (68.9 kg), SpO2 98 %. ECOG:  1 General appearance: alert and cooperative without any distress. Head: Normocephalic, without obvious abnormality oral ulcers or lesions. Neck: no adenopathy. No masses noted. Lymph nodes: Cervical, supraclavicular, and axillary nodes normal. Heart:regular rate and rhythm, S1, S2 normal, no murmur, click, rub or gallop Lung: Clear bilaterally with expiratory wheezes. Good air sounds bilaterally. Abdomin: soft, non-tender, without masses or organomegaly no shifting dullness or ascites. EXT:no erythema, induration, or nodules   Lab Results: Lab Results  Component Value Date   WBC 8.3 06/20/2016   HGB 13.7 06/20/2016   HCT 38.1 (L) 06/20/2016   MCV 84.7 06/20/2016   PLT 211 06/20/2016     Chemistry      Component Value Date/Time   NA 132 (L) 06/20/2016 1312   K 4.7 06/20/2016 1312   CL 104 02/06/2016 0540   CO2 24 06/20/2016 1312   BUN 8.7 06/20/2016 1312   CREATININE 0.7 06/20/2016 1312      Component Value Date/Time   CALCIUM 10.8 (H) 06/20/2016 1312   ALKPHOS 77 06/20/2016 1312   AST 10 06/20/2016 1312   ALT <9 06/20/2016 1312   BILITOT 0.34 06/20/2016 1312     EXAM: CT CHEST WITH CONTRAST  TECHNIQUE: Multidetector CT imaging of the chest was performed during intravenous contrast administration.  CONTRAST:  193m ISOVUE-300 IOPAMIDOL (ISOVUE-300) INJECTION 61%  COMPARISON:  04/18/2016 PET-CT and 01/18/2016 chest CT.  FINDINGS: Mediastinum/Nodes: Normal heart size. New trace pericardial effusion/ thickening. Left anterior descending and right coronary atherosclerosis. Atherosclerotic nonaneurysmal thoracic aorta. Normal caliber pulmonary arteries. No central pulmonary emboli. Hypodense 0.4 cm right thyroid lobe nodule. Unremarkable esophagus. No axillary adenopathy. New right hilar adenopathy measuring up to the 1.4 cm (series 2/ image 84). No additional pathologically enlarged mediastinal or hilar nodes.  Lungs/Pleura: No pneumothorax. No pleural  effusion. Moderate centrilobular emphysema and diffuse bronchial wall thickening. Irregular 4.1 x 2.6 cm posterior left upper lobe lung mass (series 7/ image 52), previously 3.7 x 2.5 cm using similar measurement technique, increased. Left lower lobe 7 mm pulmonary nodule (series 7/ image 101), previously 5 mm on 04/18/2016, increased. There are at least 10 new additional scattered pulmonary nodules throughout both lungs, for example a new 1.7 cm right lower lobe pulmonary nodule (series 7/ image 95), a new 1.4 cm subpleural right upper lobe pulmonary nodule (series 7/ image 41) and a new 1.2 cm right middle lobe nodule (series 7/ image 77). No acute consolidative airspace disease. Patchy reticulation and ground-glass opacity in the parahilar left lower lobe appears mildly increased, favor post treatment change.  Upper abdomen: Coarse calcification in the right liver lobe, presumably from granulomatous disease. No discrete adrenal nodules.  Musculoskeletal: No aggressive appearing focal osseous lesions. Marked thoracic spondylosis.  IMPRESSION: 1. At least 10 new scattered solid pulmonary nodules throughout both lungs measuring up to 1.7 cm in the right lower lobe, consistent with pulmonary metastases. 2. Interval growth of irregular 4.1 cm left upper lobe lung mass. 3. New right hilar lymphadenopathy, consistent with nodal metastases. 4. Increased patchy reticulation and ground-glass  opacity in the parahilar left lower lobe, favor evolving post treatment change. 5. Additional findings include New trace pericardial effusion/thickening, aortic atherosclerosis, coronary atherosclerosis and moderate emphysema. These results will be called to the ordering clinician or representative by the Radiology Department at the imaging location.    Impression and Plan:   60 year old gentleman with the following issues:  1. Squamous cell carcinoma of the larynx: He presented with  hoarseness and a 2.3 cm supraglottic laryngeal mass. He does have significant smoking history which might have contributed to this etiology. His CT scan of the neck did not show any evidence of obvious lymphadenopathy. This was diagnosed in March 2017.  He completed definitive radiation therapy with complete resolution of his tumor. His ENT evaluation did not reveal any tumor recurrence. His neck swelling is likely related to late sequela of radiation.  2. Squamous cell carcinoma of the lung presenting with endobronchial lesion and left lower lung collapse. No evidence of clear-cut metastasis in the thorax or any regional lymphadenopathy.   He is status post definitive radiation to the thorax and his PET CT scan obtained on 04/18/2016 showed no residual disease.  His CT scan on 06/21/2016 was personally reviewed and discussed with the patient and his sister. His disease have relapse at this time with multiple pulmonary nodules necessitating systemic therapy.  I discussed treatment options at this particular setting which include systemic chemotherapy versus immune therapy. His tumor histology is squamous cell carcinoma without PDL expression. His tumor should be sensitive to combination chemotherapy and he would be a reasonable candidate for carboplatin and gemcitabine. He will receive that every 21 day cycle and will eliminated day 8 for better tolerance. Complications associated with this chemotherapy include nausea, vomiting, myelosuppression, neutropenia, neutropenic sepsis and rarely bleeding and hospitalizations. After discussion today, he is agreeable to proceed after chemotherapy education class.   3. Dyspnea on exertion: Seems to be improving as is left lung aeration is normal at this time.  4. IV access: Risks and benefits of Port-A-Cath insertion was reviewed. These complications include bleeding, thrombosis and infection. He is agreeable to have that done in the near future.  5.  Antiemetics: Prescription for Compazine was made available to patient.  6. Follow-up: Will be in the next 2 weeks to receive systemic chemotherapy.    MBEMLJ,QGBEE, MD 9/1/20171:50 PM

## 2016-07-06 ENCOUNTER — Other Ambulatory Visit: Payer: Medicare Other

## 2016-07-11 ENCOUNTER — Ambulatory Visit (HOSPITAL_BASED_OUTPATIENT_CLINIC_OR_DEPARTMENT_OTHER): Payer: Medicare Other

## 2016-07-11 ENCOUNTER — Other Ambulatory Visit (HOSPITAL_BASED_OUTPATIENT_CLINIC_OR_DEPARTMENT_OTHER): Payer: Medicare Other

## 2016-07-11 ENCOUNTER — Other Ambulatory Visit: Payer: Self-pay | Admitting: Radiology

## 2016-07-11 VITALS — BP 106/75 | HR 87 | Resp 18

## 2016-07-11 DIAGNOSIS — Z5111 Encounter for antineoplastic chemotherapy: Secondary | ICD-10-CM | POA: Diagnosis not present

## 2016-07-11 DIAGNOSIS — C321 Malignant neoplasm of supraglottis: Secondary | ICD-10-CM | POA: Diagnosis not present

## 2016-07-11 DIAGNOSIS — C3490 Malignant neoplasm of unspecified part of unspecified bronchus or lung: Secondary | ICD-10-CM

## 2016-07-11 DIAGNOSIS — C3412 Malignant neoplasm of upper lobe, left bronchus or lung: Secondary | ICD-10-CM

## 2016-07-11 LAB — CBC WITH DIFFERENTIAL/PLATELET
BASO%: 0.6 % (ref 0.0–2.0)
Basophils Absolute: 0.1 10*3/uL (ref 0.0–0.1)
EOS%: 4.7 % (ref 0.0–7.0)
Eosinophils Absolute: 0.4 10*3/uL (ref 0.0–0.5)
HCT: 37.1 % — ABNORMAL LOW (ref 38.4–49.9)
HGB: 12.6 g/dL — ABNORMAL LOW (ref 13.0–17.1)
LYMPH#: 0.4 10*3/uL — AB (ref 0.9–3.3)
LYMPH%: 4.3 % — AB (ref 14.0–49.0)
MCH: 28.9 pg (ref 27.2–33.4)
MCHC: 33.8 g/dL (ref 32.0–36.0)
MCV: 85.5 fL (ref 79.3–98.0)
MONO#: 1.3 10*3/uL — AB (ref 0.1–0.9)
MONO%: 15.3 % — ABNORMAL HIGH (ref 0.0–14.0)
NEUT%: 75.1 % — ABNORMAL HIGH (ref 39.0–75.0)
NEUTROS ABS: 6.3 10*3/uL (ref 1.5–6.5)
PLATELETS: 313 10*3/uL (ref 140–400)
RBC: 4.34 10*6/uL (ref 4.20–5.82)
RDW: 13.8 % (ref 11.0–14.6)
WBC: 8.4 10*3/uL (ref 4.0–10.3)

## 2016-07-11 LAB — COMPREHENSIVE METABOLIC PANEL
ALBUMIN: 3 g/dL — AB (ref 3.5–5.0)
ALK PHOS: 71 U/L (ref 40–150)
ALT: <9 U/L (ref 0–55)
AST: 13 U/L (ref 5–34)
Anion Gap: 10 mEq/L (ref 3–11)
BUN: 14.8 mg/dL (ref 7.0–26.0)
CALCIUM: 10.1 mg/dL (ref 8.4–10.4)
CHLORIDE: 97 meq/L — AB (ref 98–109)
CO2: 25 mEq/L (ref 22–29)
Creatinine: 0.8 mg/dL (ref 0.7–1.3)
Glucose: 176 mg/dl — ABNORMAL HIGH (ref 70–140)
POTASSIUM: 4.6 meq/L (ref 3.5–5.1)
SODIUM: 131 meq/L — AB (ref 136–145)
Total Bilirubin: 0.32 mg/dL (ref 0.20–1.20)
Total Protein: 6.4 g/dL (ref 6.4–8.3)

## 2016-07-11 MED ORDER — SODIUM CHLORIDE 0.9 % IV SOLN
10.0000 mg | Freq: Once | INTRAVENOUS | Status: AC
  Administered 2016-07-11: 10 mg via INTRAVENOUS
  Filled 2016-07-11: qty 1

## 2016-07-11 MED ORDER — SODIUM CHLORIDE 0.9 % IV SOLN
1000.0000 mg/m2 | Freq: Once | INTRAVENOUS | Status: AC
  Administered 2016-07-11: 1824 mg via INTRAVENOUS
  Filled 2016-07-11: qty 47.97

## 2016-07-11 MED ORDER — PALONOSETRON HCL INJECTION 0.25 MG/5ML
INTRAVENOUS | Status: AC
  Filled 2016-07-11: qty 5

## 2016-07-11 MED ORDER — PALONOSETRON HCL INJECTION 0.25 MG/5ML
0.2500 mg | Freq: Once | INTRAVENOUS | Status: AC
  Administered 2016-07-11: 0.25 mg via INTRAVENOUS

## 2016-07-11 MED ORDER — SODIUM CHLORIDE 0.9 % IV SOLN
597.5000 mg | Freq: Once | INTRAVENOUS | Status: AC
  Administered 2016-07-11: 600 mg via INTRAVENOUS
  Filled 2016-07-11: qty 60

## 2016-07-11 MED ORDER — SODIUM CHLORIDE 0.9 % IV SOLN
Freq: Once | INTRAVENOUS | Status: AC
  Administered 2016-07-11: 10:00:00 via INTRAVENOUS

## 2016-07-11 NOTE — Patient Instructions (Signed)
[facility] Discharge Instructions for Patients Receiving Chemotherapy  Today you received the following chemotherapy agents Carboplatin/Gemcitabine  To help prevent nausea and vomiting after your treatment, we encourage you to take your nausea medication compazine as directed by your MD.  If you develop nausea and vomiting that is not controlled by your nausea medication, call the clinic.   BELOW ARE SYMPTOMS THAT SHOULD BE REPORTED IMMEDIATELY:  *FEVER GREATER THAN 100.5 F  *CHILLS WITH OR WITHOUT FEVER  NAUSEA AND VOMITING THAT IS NOT CONTROLLED WITH YOUR NAUSEA MEDICATION  *UNUSUAL SHORTNESS OF BREATH  *UNUSUAL BRUISING OR BLEEDING  TENDERNESS IN MOUTH AND THROAT WITH OR WITHOUT PRESENCE OF ULCERS  *URINARY PROBLEMS  *BOWEL PROBLEMS  UNUSUAL RASH Items with * indicate a potential emergency and should be followed up as soon as possible.  Feel free to call the clinic you have any questions or concerns. The clinic phone number is [phone].  Please show the [facility] at check-in to the Emergency Department and triage nurse.   Gemcitabine injection GEMZAR What is this medicine? GEMCITABINE (jem SIT a been) is a chemotherapy drug. This medicine is used to treat many types of cancer like breast cancer, lung cancer, pancreatic cancer, and ovarian cancer. This medicine may be used for other purposes; ask your health care provider or pharmacist if you have questions. What should I tell my health care provider before I take this medicine? They need to know if you have any of these conditions: -blood disorders -infection -kidney disease -liver disease -recent or ongoing radiation therapy -an unusual or allergic reaction to gemcitabine, other chemotherapy, other medicines, foods, dyes, or preservatives -pregnant or trying to get pregnant -breast-feeding How should I use this medicine? This drug is given as an infusion into a vein. It is administered  in a hospital or clinic by a specially trained health care professional. Talk to your pediatrician regarding the use of this medicine in children. Special care may be needed. Overdosage: If you think you have taken too much of this medicine contact a poison control center or emergency room at once. NOTE: This medicine is only for you. Do not share this medicine with others. What if I miss a dose? It is important not to miss your dose. Call your doctor or health care professional if you are unable to keep an appointment. What may interact with this medicine? -medicines to increase blood counts like filgrastim, pegfilgrastim, sargramostim -some other chemotherapy drugs like cisplatin -vaccines Talk to your doctor or health care professional before taking any of these medicines: -acetaminophen -aspirin -ibuprofen -ketoprofen -naproxen This list may not describe all possible interactions. Give your health care provider a list of all the medicines, herbs, non-prescription drugs, or dietary supplements you use. Also tell them if you smoke, drink alcohol, or use illegal drugs. Some items may interact with your medicine. What should I watch for while using this medicine? Visit your doctor for checks on your progress. This drug may make you feel generally unwell. This is not uncommon, as chemotherapy can affect healthy cells as well as cancer cells. Report any side effects. Continue your course of treatment even though you feel ill unless your doctor tells you to stop. In some cases, you may be given additional medicines to help with side effects. Follow all directions for their use. Call your doctor or health care professional for advice if you get a fever, chills or sore throat, or other symptoms of a cold or  flu. Do not treat yourself. This drug decreases your body's ability to fight infections. Try to avoid being around people who are sick. This medicine may increase your risk to bruise or bleed.  Call your doctor or health care professional if you notice any unusual bleeding. Be careful brushing and flossing your teeth or using a toothpick because you may get an infection or bleed more easily. If you have any dental work done, tell your dentist you are receiving this medicine. Avoid taking products that contain aspirin, acetaminophen, ibuprofen, naproxen, or ketoprofen unless instructed by your doctor. These medicines may hide a fever. Women should inform their doctor if they wish to become pregnant or think they might be pregnant. There is a potential for serious side effects to an unborn child. Talk to your health care professional or pharmacist for more information. Do not breast-feed an infant while taking this medicine. What side effects may I notice from receiving this medicine? Side effects that you should report to your doctor or health care professional as soon as possible: -allergic reactions like skin rash, itching or hives, swelling of the face, lips, or tongue -low blood counts - this medicine may decrease the number of white blood cells, red blood cells and platelets. You may be at increased risk for infections and bleeding. -signs of infection - fever or chills, cough, sore throat, pain or difficulty passing urine -signs of decreased platelets or bleeding - bruising, pinpoint red spots on the skin, black, tarry stools, blood in the urine -signs of decreased red blood cells - unusually weak or tired, fainting spells, lightheadedness -breathing problems -chest pain -mouth sores -nausea and vomiting -pain, swelling, redness at site where injected -pain, tingling, numbness in the hands or feet -stomach pain -swelling of ankles, feet, hands -unusual bleeding Side effects that usually do not require medical attention (report to your doctor or health care professional if they continue or are bothersome): -constipation -diarrhea -hair loss -loss of appetite -stomach  upset This list may not describe all possible side effects. Call your doctor for medical advice about side effects. You may report side effects to FDA at 1-800-FDA-1088. Where should I keep my medicine? This drug is given in a hospital or clinic and will not be stored at home. NOTE: This sheet is a summary. It may not cover all possible information. If you have questions about this medicine, talk to your doctor, pharmacist, or health care provider.    2016, Elsevier/Gold Standard. (2008-02-24 18:45:54)  Carboplatin injection What is this medicine? CARBOPLATIN (KAR boe pla tin) is a chemotherapy drug. It targets fast dividing cells, like cancer cells, and causes these cells to die. This medicine is used to treat ovarian cancer and many other cancers. This medicine may be used for other purposes; ask your health care provider or pharmacist if you have questions. What should I tell my health care provider before I take this medicine? They need to know if you have any of these conditions: -blood disorders -hearing problems -kidney disease -recent or ongoing radiation therapy -an unusual or allergic reaction to carboplatin, cisplatin, other chemotherapy, other medicines, foods, dyes, or preservatives -pregnant or trying to get pregnant -breast-feeding How should I use this medicine? This drug is usually given as an infusion into a vein. It is administered in a hospital or clinic by a specially trained health care professional. Talk to your pediatrician regarding the use of this medicine in children. Special care may be needed. Overdosage: If you think you  have taken too much of this medicine contact a poison control center or emergency room at once. NOTE: This medicine is only for you. Do not share this medicine with others. What if I miss a dose? It is important not to miss a dose. Call your doctor or health care professional if you are unable to keep an appointment. What may interact with  this medicine? -medicines for seizures -medicines to increase blood counts like filgrastim, pegfilgrastim, sargramostim -some antibiotics like amikacin, gentamicin, neomycin, streptomycin, tobramycin -vaccines Talk to your doctor or health care professional before taking any of these medicines: -acetaminophen -aspirin -ibuprofen -ketoprofen -naproxen This list may not describe all possible interactions. Give your health care provider a list of all the medicines, herbs, non-prescription drugs, or dietary supplements you use. Also tell them if you smoke, drink alcohol, or use illegal drugs. Some items may interact with your medicine. What should I watch for while using this medicine? Your condition will be monitored carefully while you are receiving this medicine. You will need important blood work done while you are taking this medicine. This drug may make you feel generally unwell. This is not uncommon, as chemotherapy can affect healthy cells as well as cancer cells. Report any side effects. Continue your course of treatment even though you feel ill unless your doctor tells you to stop. In some cases, you may be given additional medicines to help with side effects. Follow all directions for their use. Call your doctor or health care professional for advice if you get a fever, chills or sore throat, or other symptoms of a cold or flu. Do not treat yourself. This drug decreases your body's ability to fight infections. Try to avoid being around people who are sick. This medicine may increase your risk to bruise or bleed. Call your doctor or health care professional if you notice any unusual bleeding. Be careful brushing and flossing your teeth or using a toothpick because you may get an infection or bleed more easily. If you have any dental work done, tell your dentist you are receiving this medicine. Avoid taking products that contain aspirin, acetaminophen, ibuprofen, naproxen, or ketoprofen  unless instructed by your doctor. These medicines may hide a fever. Do not become pregnant while taking this medicine. Women should inform their doctor if they wish to become pregnant or think they might be pregnant. There is a potential for serious side effects to an unborn child. Talk to your health care professional or pharmacist for more information. Do not breast-feed an infant while taking this medicine. What side effects may I notice from receiving this medicine? Side effects that you should report to your doctor or health care professional as soon as possible: -allergic reactions like skin rash, itching or hives, swelling of the face, lips, or tongue -signs of infection - fever or chills, cough, sore throat, pain or difficulty passing urine -signs of decreased platelets or bleeding - bruising, pinpoint red spots on the skin, black, tarry stools, nosebleeds -signs of decreased red blood cells - unusually weak or tired, fainting spells, lightheadedness -breathing problems -changes in hearing -changes in vision -chest pain -high blood pressure -low blood counts - This drug may decrease the number of white blood cells, red blood cells and platelets. You may be at increased risk for infections and bleeding. -nausea and vomiting -pain, swelling, redness or irritation at the injection site -pain, tingling, numbness in the hands or feet -problems with balance, talking, walking -trouble passing urine or change in  the amount of urine Side effects that usually do not require medical attention (report to your doctor or health care professional if they continue or are bothersome): -hair loss -loss of appetite -metallic taste in the mouth or changes in taste This list may not describe all possible side effects. Call your doctor for medical advice about side effects. You may report side effects to FDA at 1-800-FDA-1088. Where should I keep my medicine? This drug is given in a hospital or clinic and  will not be stored at home. NOTE: This sheet is a summary. It may not cover all possible information. If you have questions about this medicine, talk to your doctor, pharmacist, or health care provider.    2016, [employer]vier/Gold Standard. (2008-01-20 14:38:05)

## 2016-07-11 NOTE — Progress Notes (Signed)
Pt tolerated 1st time gemzar/carboplatin today. Pt did have some mild burning around piv site with gemzar infusion. Gave pt another nss bag to dilute medication during infusion and for pt comfort. Pt states that his burning resolved right away. Notified pharmacy so they can dilute gemzar with more NS next treatment. Pt did report that he will have a port placed this Friday. Reinforced possible side effects that he may experience at home. Reviewed s/s and when to go to ED. Pt verbalized understanding and when to call for any concerns. Follow phone call placed in "in basket".

## 2016-07-12 ENCOUNTER — Other Ambulatory Visit: Payer: Self-pay | Admitting: Radiology

## 2016-07-13 ENCOUNTER — Other Ambulatory Visit: Payer: Self-pay | Admitting: Oncology

## 2016-07-13 ENCOUNTER — Ambulatory Visit (HOSPITAL_COMMUNITY)
Admission: RE | Admit: 2016-07-13 | Discharge: 2016-07-13 | Disposition: A | Discharge status: Home / Self Care | Payer: Medicare Other | Source: Ambulatory Visit | Attending: Oncology | Admitting: Oncology

## 2016-07-13 ENCOUNTER — Encounter (HOSPITAL_COMMUNITY): Payer: Self-pay

## 2016-07-13 DIAGNOSIS — K219 Gastro-esophageal reflux disease without esophagitis: Secondary | ICD-10-CM | POA: Diagnosis not present

## 2016-07-13 DIAGNOSIS — Z8249 Family history of ischemic heart disease and other diseases of the circulatory system: Secondary | ICD-10-CM | POA: Insufficient documentation

## 2016-07-13 DIAGNOSIS — E785 Hyperlipidemia, unspecified: Secondary | ICD-10-CM | POA: Diagnosis not present

## 2016-07-13 DIAGNOSIS — F209 Schizophrenia, unspecified: Secondary | ICD-10-CM | POA: Diagnosis not present

## 2016-07-13 DIAGNOSIS — Z7984 Long term (current) use of oral hypoglycemic drugs: Secondary | ICD-10-CM | POA: Insufficient documentation

## 2016-07-13 DIAGNOSIS — Z801 Family history of malignant neoplasm of trachea, bronchus and lung: Secondary | ICD-10-CM | POA: Insufficient documentation

## 2016-07-13 DIAGNOSIS — C3491 Malignant neoplasm of unspecified part of right bronchus or lung: Secondary | ICD-10-CM | POA: Diagnosis not present

## 2016-07-13 DIAGNOSIS — I10 Essential (primary) hypertension: Secondary | ICD-10-CM | POA: Insufficient documentation

## 2016-07-13 DIAGNOSIS — F17210 Nicotine dependence, cigarettes, uncomplicated: Secondary | ICD-10-CM | POA: Insufficient documentation

## 2016-07-13 DIAGNOSIS — Z923 Personal history of irradiation: Secondary | ICD-10-CM | POA: Diagnosis not present

## 2016-07-13 DIAGNOSIS — C321 Malignant neoplasm of supraglottis: Secondary | ICD-10-CM | POA: Insufficient documentation

## 2016-07-13 DIAGNOSIS — E119 Type 2 diabetes mellitus without complications: Secondary | ICD-10-CM | POA: Insufficient documentation

## 2016-07-13 DIAGNOSIS — Z9221 Personal history of antineoplastic chemotherapy: Secondary | ICD-10-CM | POA: Insufficient documentation

## 2016-07-13 HISTORY — DX: Malignant (primary) neoplasm, unspecified: C80.1

## 2016-07-13 HISTORY — PX: IR GENERIC HISTORICAL: IMG1180011

## 2016-07-13 LAB — CBC WITH DIFFERENTIAL/PLATELET
Basophils Absolute: 0 10*3/uL (ref 0.0–0.1)
Basophils Relative: 0 %
Eosinophils Absolute: 0.1 10*3/uL (ref 0.0–0.7)
Eosinophils Relative: 2 %
HEMATOCRIT: 32.5 % — AB (ref 39.0–52.0)
Hemoglobin: 11.6 g/dL — ABNORMAL LOW (ref 13.0–17.0)
LYMPHS ABS: 0.3 10*3/uL — AB (ref 0.7–4.0)
LYMPHS PCT: 5 %
MCH: 28.8 pg (ref 26.0–34.0)
MCHC: 35.7 g/dL (ref 30.0–36.0)
MCV: 80.6 fL (ref 78.0–100.0)
MONO ABS: 0.2 10*3/uL (ref 0.1–1.0)
MONOS PCT: 4 %
NEUTROS ABS: 5.6 10*3/uL (ref 1.7–7.7)
Neutrophils Relative %: 89 %
Platelets: 217 10*3/uL (ref 150–400)
RBC: 4.03 MIL/uL — ABNORMAL LOW (ref 4.22–5.81)
RDW: 13 % (ref 11.5–15.5)
WBC: 6.3 10*3/uL (ref 4.0–10.5)

## 2016-07-13 LAB — GLUCOSE, CAPILLARY
Glucose-Capillary: 106 mg/dL — ABNORMAL HIGH (ref 65–99)
Glucose-Capillary: 120 mg/dL — ABNORMAL HIGH (ref 65–99)

## 2016-07-13 LAB — PROTIME-INR
INR: 1.18
Prothrombin Time: 15.1 seconds (ref 11.4–15.2)

## 2016-07-13 MED ORDER — MIDAZOLAM HCL 2 MG/2ML IJ SOLN
INTRAMUSCULAR | Status: AC
  Filled 2016-07-13: qty 6

## 2016-07-13 MED ORDER — SODIUM CHLORIDE 0.9 % IV SOLN: INTRAVENOUS | Status: DC

## 2016-07-13 MED ORDER — LIDOCAINE HCL 1 % IJ SOLN
INTRAMUSCULAR | Status: AC | PRN
  Administered 2016-07-13: 10 mL

## 2016-07-13 MED ORDER — HEPARIN SOD (PORK) LOCK FLUSH 100 UNIT/ML IV SOLN
INTRAVENOUS | Status: AC | PRN
  Administered 2016-07-13: 500 [IU] via INTRAVENOUS

## 2016-07-13 MED ORDER — FENTANYL CITRATE (PF) 100 MCG/2ML IJ SOLN
INTRAMUSCULAR | Status: AC | PRN
  Administered 2016-07-13: 50 ug via INTRAVENOUS

## 2016-07-13 MED ORDER — LIDOCAINE-EPINEPHRINE (PF) 2 %-1:200000 IJ SOLN
INTRAMUSCULAR | Status: AC | PRN
  Administered 2016-07-13: 10 mL

## 2016-07-13 MED ORDER — CEFAZOLIN SODIUM-DEXTROSE 2-4 GM/100ML-% IV SOLN
2.0000 g | INTRAVENOUS | Status: AC
  Administered 2016-07-13: 2 g via INTRAVENOUS
  Filled 2016-07-13: qty 100

## 2016-07-13 MED ORDER — MIDAZOLAM HCL 2 MG/2ML IJ SOLN
INTRAMUSCULAR | Status: AC | PRN
  Administered 2016-07-13 (×3): 1 mg via INTRAVENOUS

## 2016-07-13 MED ORDER — FENTANYL CITRATE (PF) 100 MCG/2ML IJ SOLN
INTRAMUSCULAR | Status: AC
  Filled 2016-07-13: qty 4

## 2016-07-13 MED ORDER — HEPARIN SOD (PORK) LOCK FLUSH 100 UNIT/ML IV SOLN
INTRAVENOUS | Status: AC
  Filled 2016-07-13: qty 5

## 2016-07-13 MED ORDER — LIDOCAINE HCL 1 % IJ SOLN
INTRAMUSCULAR | Status: AC
  Filled 2016-07-13: qty 20

## 2016-07-13 MED ORDER — LIDOCAINE-EPINEPHRINE (PF) 2 %-1:200000 IJ SOLN
INTRAMUSCULAR | Status: AC
  Filled 2016-07-13: qty 20

## 2016-07-13 NOTE — Discharge Instructions (Signed)
Implanted Port Home Guide °An implanted port is a type of central line that is placed under the skin. Central lines are used to provide IV access when treatment or nutrition needs to be given through a person's veins. Implanted ports are used for long-term IV access. An implanted port may be placed because:  °· You need IV medicine that would be irritating to the small veins in your hands or arms.   °· You need long-term IV medicines, such as antibiotics.   °· You need IV nutrition for a long period.   °· You need frequent blood draws for lab tests.   °· You need dialysis.   °Implanted ports are usually placed in the chest area, but they can also be placed in the upper arm, the abdomen, or the leg. An implanted port has two main parts:  °· Reservoir. The reservoir is round and will appear as a small, raised area under your skin. The reservoir is the part where a needle is inserted to give medicines or draw blood.   °· Catheter. The catheter is a thin, flexible tube that extends from the reservoir. The catheter is placed into a large vein. Medicine that is inserted into the reservoir goes into the catheter and then into the vein.   °HOW WILL I CARE FOR MY INCISION SITE? °Do not get the incision site wet. Bathe or shower as directed by your health care provider.  °HOW IS MY PORT ACCESSED? °Special steps must be taken to access the port:  °· Before the port is accessed, a numbing cream can be placed on the skin. This helps numb the skin over the port site.   °· Your health care provider uses a sterile technique to access the port. °¨ Your health care provider must put on a mask and sterile gloves. °¨ The skin over your port is cleaned carefully with an antiseptic and allowed to dry. °¨ The port is gently pinched between sterile gloves, and a needle is inserted into the port. °· Only "non-coring" port needles should be used to access the port. Once the port is accessed, a blood return should be checked. This helps  ensure that the port is in the vein and is not clogged.   °· If your port needs to remain accessed for a constant infusion, a clear (transparent) bandage will be placed over the needle site. The bandage and needle will need to be changed every week, or as directed by your health care provider.   °· Keep the bandage covering the needle clean and dry. Do not get it wet. Follow your health care provider's instructions on how to take a shower or bath while the port is accessed.   °· If your port does not need to stay accessed, no bandage is needed over the port.   °WHAT IS FLUSHING? °Flushing helps keep the port from getting clogged. Follow your health care provider's instructions on how and when to flush the port. Ports are usually flushed with saline solution or a medicine called heparin. The need for flushing will depend on how the port is used.  °· If the port is used for intermittent medicines or blood draws, the port will need to be flushed:   °¨ After medicines have been given.   °¨ After blood has been drawn.   °¨ As part of routine maintenance.   °· If a constant infusion is running, the port may not need to be flushed.   °HOW LONG WILL MY PORT STAY IMPLANTED? °The port can stay in for as long as your health care   provider thinks it is needed. When it is time for the port to come out, surgery will be done to remove it. The procedure is similar to the one performed when the port was put in.  °WHEN SHOULD I SEEK IMMEDIATE MEDICAL CARE? °When you have an implanted port, you should seek immediate medical care if:  °· You notice a bad smell coming from the incision site.   °· You have swelling, redness, or drainage at the incision site.   °· You have more swelling or pain at the port site or the surrounding area.   °· You have a fever that is not controlled with medicine. °  °This information is not intended to replace advice given to you by your health care provider. Make sure you discuss any questions you have with  your health care provider. °  °Document Released: 10/15/2005 Document Revised: 08/05/2013 Document Reviewed: 06/22/2013 °Elsevier Interactive Patient Education ©2016 [employer] °Implanted Port Insertion, Care After °Refer to this sheet in the next few weeks. These instructions provide you with information on caring for yourself after your procedure. Your health care provider may also give you more specific instructions. Your treatment has been planned according to current medical practices, but problems sometimes occur. Call your health care provider if you have any problems or questions after your procedure. °WHAT TO EXPECT AFTER THE PROCEDURE °After your procedure, it is typical to have the following:  °· Discomfort at the port insertion site. Ice packs to the area will help. °· Bruising on the skin over the port. This will subside in 3-4 days. °HOME CARE INSTRUCTIONS °· After your port is placed, you will get a manufacturer's information card. The card has information about your port. Keep this card with you at all times.   °· Know what kind of port you have. There are many types of ports available.   °· Wear a medical alert bracelet in case of an emergency. This can help alert health care workers that you have a port.   °· The port can stay in for as long as your health care provider believes it is necessary.   °· A home health care nurse may give medicines and take care of the port.   °· You or a family member can get special training and directions for giving medicine and taking care of the port at home.   °SEEK MEDICAL CARE IF:  °· Your port does not flush or you are unable to get a blood return.   °· You have a fever or chills. °SEEK IMMEDIATE MEDICAL CARE IF: °· You have new fluid or pus coming from your incision.   °· You notice a bad smell coming from your incision site.   °· You have swelling, pain, or more redness at the incision or port site.   °· You have chest pain or shortness of breath. °  °This  information is not intended to replace advice given to you by your health care provider. Make sure you discuss any questions you have with your health care provider. °  °Document Released: 08/05/2013 Document Revised: 10/20/2013 Document Reviewed: 08/05/2013 °Elsevier Interactive Patient Education ©2016 [employer] °Moderate Conscious Sedation, Adult °Sedation is the use of medicines to promote relaxation and relieve discomfort and anxiety. Moderate conscious sedation is a type of sedation. Under moderate conscious sedation you are less alert than normal but are still able to respond to instructions or stimulation. Moderate conscious sedation is used during short medical and dental procedures. It is milder than deep sedation or general anesthesia and   allows you to return to your regular activities sooner. LET [facility] CARE PROVIDER KNOW ABOUT:   Any allergies you have.  All medicines you are taking, including vitamins, herbs, eye drops, creams, and over-the-counter medicines.  Use of steroids (by mouth or creams).  Previous problems you or members of your family have had with the use of anesthetics.  Any blood disorders you have.  Previous surgeries you have had.  Medical conditions you have.  Possibility of pregnancy, if this applies.  Use of cigarettes, alcohol, or illegal drugs. RISKS AND COMPLICATIONS Generally, this is a safe procedure. However, as with any procedure, problems can occur. Possible problems include:  Oversedation.  Trouble breathing on your own. You may need to have a breathing tube until you are awake and breathing on your own.  Allergic reaction to any of the medicines used for the procedure. BEFORE THE PROCEDURE  You may have blood tests done. These tests can help show how well your kidneys and liver are working. They can also show how well your blood clots.  A physical exam will be done.  Only take medicines as directed by your health care provider.  You may need to stop taking medicines (such as blood thinners, aspirin, or nonsteroidal anti-inflammatory drugs) before the procedure.   Do not eat or drink at least 6 hours before the procedure or as directed by your health care provider.  Arrange for a responsible adult, family member, or friend to take you home after the procedure. He or she should stay with you for at least 24 hours after the procedure, until the medicine has worn off. PROCEDURE   An intravenous (IV) catheter will be inserted into one of your veins. Medicine will be able to flow directly into your body through this catheter. You may be given medicine through this tube to help prevent pain and help you relax.  The medical or dental procedure will be done. AFTER THE PROCEDURE  You will stay in a recovery area until the medicine has worn off. Your blood pressure and pulse will be checked.   Depending on the procedure you had, you may be allowed to go home when you can tolerate liquids and your pain is under control.   This information is not intended to replace advice given to you by your health care provider. Make sure you discuss any questions you have with your health care provider.   Document Released: 07/10/2001 Document Revised: 11/05/2014 Document Reviewed: 06/22/2013 Elsevier Interactive Patient Education 2016 [employer]  Moderate Conscious Sedation, Adult, Care After Refer to this sheet in the next few weeks. These instructions provide you with information on caring for yourself after your procedure. Your health care provider may also give you more specific instructions. Your treatment has been planned according to current medical practices, but problems sometimes occur. Call your health care provider if you have any problems or questions after your procedure. WHAT TO EXPECT AFTER THE PROCEDURE  After your procedure:  You may feel sleepy, clumsy, and have poor balance for several hours.  Vomiting may  occur if you eat too soon after the procedure. HOME CARE INSTRUCTIONS  Do not participate in any activities where you could become injured for at least 24 hours. Do not:  Drive.  Swim.  Ride a bicycle.  Operate heavy machinery.  Cook.  Use power tools.  Climb ladders.  Work from a high place.  Do not make important decisions or sign legal documents until you are  improved.  If you vomit, drink water, juice, or soup when you can drink without vomiting. Make sure you have little or no nausea before eating solid foods.  Only take over-the-counter or prescription medicines for pain, discomfort, or fever as directed by your health care provider.  Make sure you and your family fully understand everything about the medicines given to you, including what side effects may occur.  You should not drink alcohol, take sleeping pills, or take medicines that cause drowsiness for at least 24 hours.  If you smoke, do not smoke without supervision.  If you are feeling better, you may resume normal activities 24 hours after you were sedated.  Keep all appointments with your health care provider. SEEK MEDICAL CARE IF:  Your skin is pale or bluish in color.  You continue to feel nauseous or vomit.  Your pain is getting worse and is not helped by medicine.  You have bleeding or swelling.  You are still sleepy or feeling clumsy after 24 hours. SEEK IMMEDIATE MEDICAL CARE IF:  You develop a rash.  You have difficulty breathing.  You develop any type of allergic problem.  You have a fever. MAKE SURE YOU:  Understand these instructions.  Will watch your condition.  Will get help right away if you are not doing well or get worse.   This information is not intended to replace advice given to you by your health care provider. Make sure you discuss any questions you have with your health care provider.   Document Released: 08/05/2013 Document Revised: 11/05/2014 Document Reviewed:  08/05/2013 Elsevier Interactive Patient Education [employer].

## 2016-07-13 NOTE — Consult Note (Signed)
Chief Complaint: Patient was seen in consultation today for Port-A-Cath placement  Referring Physician(s): Wyatt Portela  Supervising Physician: Daryll Brod  Patient Status: Outpatient  History of Present Illness: Chad Wang is a 60 y.o. 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 He is status post radiation and 1 cycle of chemotherapy. He presents today for Port-A-Cath placement for additional planned chemotherapy.   Past Medical History:  Diagnosis Date  . Diabetes mellitus without complication ([place])   . GERD (gastroesophageal reflux disease)   . Hyperlipemia   . Hypertension   . Schizophrenia [facility])     Past Surgical History:  Procedure Laterality Date  . KNEE ARTHROSCOPY    . MICROLARYNGOSCOPY WITH CO2 LASER AND EXCISION OF VOCAL CORD LESION N/A 01/25/2016   Procedure: MICROLARYNGOSCOPY WITH CO2 LASER, DEBULKING OF LARYNX MASS ;  Surgeon: Melida Quitter, MD;  Location: [place];  Service: ENT;  Laterality: N/A;  . VIDEO BRONCHOSCOPY Bilateral 01/20/2016   Procedure: VIDEO BRONCHOSCOPY WITHOUT FLUORO;  Surgeon: Rigoberto Noel, MD;  Location: [place];  Service: Cardiopulmonary;  Laterality: Bilateral;    Allergies: Review of patient's allergies indicates no known allergies.  Medications: Prior to Admission medications   Medication Sig Start Date End Date Taking? Authorizing Provider  albuterol (PROVENTIL HFA;VENTOLIN HFA) 108 (90 Base) MCG/ACT inhaler Inhale 2 puffs into the lungs every 6 (six) hours as needed for wheezing or shortness of breath. 01/28/16   Reyne Dumas, MD  chlorpheniramine-HYDROcodone (TUSSIONEX) 10-8 MG/5ML SUER Take 5 mLs by mouth every 12 (twelve) hours. 01/28/16   Reyne Dumas, MD  cloZAPine (CLOZARIL) 100 MG tablet Take 100 mg by mouth at bedtime.     Historical Provider, MD  divalproex (DEPAKOTE) 500 MG DR tablet Take 500-1,000 mg by mouth 2 (two) times daily. Takes  '500mg'$  in am and '1000mg'$  in pm    Historical Provider, MD  docusate sodium (COLACE) 100 MG capsule Take 1 capsule (100 mg total) by mouth 2 (two) times daily. 01/28/16   Reyne Dumas, MD  feeding supplement, GLUCERNA SHAKE, (GLUCERNA SHAKE) LIQD Take 237 mLs by mouth 2 (two) times daily between meals. 01/28/16   Reyne Dumas, MD  gemfibrozil (LOPID) 600 MG tablet Take 600 mg by mouth 2 (two) times daily.    Historical Provider, MD  lidocaine-prilocaine (EMLA) cream Apply 1 application topically as needed. Apply to port before chemotherapy. 06/29/16   Wyatt Portela, MD  metFORMIN (GLUCOPHAGE) 1000 MG tablet Take 500 mg by mouth 2 (two) times daily with a meal.     Historical Provider, MD  mometasone-formoterol (DULERA) 200-5 MCG/ACT AERO Inhale 2 puffs into the lungs 2 (two) times daily. 01/28/16   Reyne Dumas, MD  prochlorperazine (COMPAZINE) 10 MG tablet Take 1 tablet (10 mg total) by mouth every 6 (six) hours as needed for nausea or vomiting. 06/29/16   Wyatt Portela, MD  sucralfate (CARAFATE) 1 g tablet Take 1 tablet (1 g total) by mouth 4 (four) times daily -  with meals and at bedtime. 03/12/16   Hayden Pedro, PA-C  Wound Cleansers (RADIAPLEX EX) Apply topically. Reported on 04/19/2016    Historical Provider, MD  Wound Dressings (SONAFINE) Apply 1 application topically 3 (three) times daily. Reported on 04/19/2016 02/24/16   Tyler Pita, MD     Family History  Problem Relation Age of Onset  . CAD Mother   . Lung cancer Father   .  CAD Brother   . Diabetes Other   . Stroke Neg Hx     Social History   Social History  . Marital status: Single    Spouse name: N/A  . Number of children: N/A  . Years of education: N/A   Social History Main Topics  . Smoking status: Current Every Day Smoker  . Smokeless tobacco: Not on file  . Alcohol use Yes  . Drug use: No  . Sexual activity: Yes   Other Topics Concern  . Not on file   Social History Narrative  . No narrative on file      Review of Systems currently denies fever, headache, chest pain, abdominal/back pain, vomiting or abnormal bleeding; he does have some dyspnea with exertion, fatigue, weight loss,  occasional cough and occasional nausea.  Vital Signs: BP 119/68 (BP Location: Left Arm)   Pulse (!) 105   Temp 98.1 F (36.7 C) (Oral)   Resp 18   SpO2 99%   Physical Exam awake, alert. Chest clear to auscultation bilaterally; heart with sl tachy but reg rhythm; abdomen soft, positive bowel sounds, nontender; LE- no edema  Mallampati Score:     Imaging: Ct Soft Tissue Neck W Contrast  Result Date: 06/21/2016 CLINICAL DATA:  60 year old male with neck swelling. Squamous cell lung cancer. Supraglottic laryngeal mass on neck CT in March. Subsequent encounter. EXAM: CT NECK WITH CONTRAST TECHNIQUE: Multidetector CT imaging of the neck was performed using the standard protocol following the bolus administration of intravenous contrast. CONTRAST:  142m ISOVUE-300 IOPAMIDOL (ISOVUE-300) INJECTION 61% COMPARISON:  PET-CT 04/18/2016.  Neck CT 01/20/2016. FINDINGS: Pharynx and larynx: Generalized pharyngeal mucosal space soft tissue thickening/ edema compatible with post radiation change. Minimal asymmetry of enhancement along the anterior supraglottic larynx at the base of the epiglottis (series 602, image 72) corresponding to the site of the 2.3 cm mass in March. Symmetric appearance of the 80 folds today. Focal cords appear symmetric. Laryngeal cartilages appear within normal limits. No pharyngeal mass. Small retropharyngeal effusion. Negative parapharyngeal spaces. Salivary glands: Negative sublingual space. Post radiation appearance of the submandibular and parotid glands. Thyroid: Negative. Lymph nodes: Decreased size of the sub cm but previously conspicuous right level 2 through level 4 lymph nodes (sagittal image 20 today versus sagittal image 45 in March). No lymphadenopathy. Generalized thickening of the  platysma and fat stranding in the neck compatible with radiation. Vascular: Major vascular structures in the neck and at the skullbase remain patent, although both internal jugular veins have a smaller caliber than previously. No focal thrombus identified. Soft and calcified carotid bifurcation atherosclerosis appears stable. Limited intracranial: Stable. Visualized orbits: Minimally included today. Mastoids and visualized paranasal sinuses: Visualized paranasal sinuses and mastoids are stable and well pneumatized. Skeleton: No acute or suspicious osseous lesion in the neck. Upper chest: Reported separately today. IMPRESSION: 1. Post radiation changes throughout the neck. 2. Regression of disease with only subtle asymmetric soft tissue enhancement at the right supraglottic larynx corresponding to the previous 2.3 cm tumor. No cervical lymphadenopathy. 3. No new abnormality in the neck. 4. CT Chest today reported separately. Electronically Signed   By: HGenevie AnnM.D.   On: 06/21/2016 08:46   Ct Chest W Contrast  Result Date: 06/21/2016 CLINICAL DATA:  Squamous cell carcinoma of the larynx diagnosed 01/18/2016 and squamous cell carcinoma of the left mainstem bronchus diagnosed April 2017, status post definitive radiation therapy to the larynx and left lung completed May 2017. Patient reports neck swelling for 1  month. EXAM: CT CHEST WITH CONTRAST TECHNIQUE: Multidetector CT imaging of the chest was performed during intravenous contrast administration. CONTRAST:  112m ISOVUE-300 IOPAMIDOL (ISOVUE-300) INJECTION 61% COMPARISON:  04/18/2016 PET-CT and 01/18/2016 chest CT. FINDINGS: Mediastinum/Nodes: Normal heart size. New trace pericardial effusion/ thickening. Left anterior descending and right coronary atherosclerosis. Atherosclerotic nonaneurysmal thoracic aorta. Normal caliber pulmonary arteries. No central pulmonary emboli. Hypodense 0.4 cm right thyroid lobe nodule. Unremarkable esophagus. No axillary  adenopathy. New right hilar adenopathy measuring up to the 1.4 cm (series 2/ image 84). No additional pathologically enlarged mediastinal or hilar nodes. Lungs/Pleura: No pneumothorax. No pleural effusion. Moderate centrilobular emphysema and diffuse bronchial wall thickening. Irregular 4.1 x 2.6 cm posterior left upper lobe lung mass (series 7/ image 52), previously 3.7 x 2.5 cm using similar measurement technique, increased. Left lower lobe 7 mm pulmonary nodule (series 7/ image 101), previously 5 mm on 04/18/2016, increased. There are at least 10 new additional scattered pulmonary nodules throughout both lungs, for example a new 1.7 cm right lower lobe pulmonary nodule (series 7/ image 95), a new 1.4 cm subpleural right upper lobe pulmonary nodule (series 7/ image 41) and a new 1.2 cm right middle lobe nodule (series 7/ image 77). No acute consolidative airspace disease. Patchy reticulation and ground-glass opacity in the parahilar left lower lobe appears mildly increased, favor post treatment change. Upper abdomen: Coarse calcification in the right liver lobe, presumably from granulomatous disease. No discrete adrenal nodules. Musculoskeletal: No aggressive appearing focal osseous lesions. Marked thoracic spondylosis. IMPRESSION: 1. At least 10 new scattered solid pulmonary nodules throughout both lungs measuring up to 1.7 cm in the right lower lobe, consistent with pulmonary metastases. 2. Interval growth of irregular 4.1 cm left upper lobe lung mass. 3. New right hilar lymphadenopathy, consistent with nodal metastases. 4. Increased patchy reticulation and ground-glass opacity in the parahilar left lower lobe, favor evolving post treatment change. 5. Additional findings include New trace pericardial effusion/thickening, aortic atherosclerosis, coronary atherosclerosis and moderate emphysema. These results will be called to the ordering clinician or representative by the Radiology Department at the imaging  location. Electronically Signed   By: JIlona SorrelM.D.   On: 06/21/2016 08:57    Labs:  CBC:  Recent Labs  02/05/16 0331 04/17/16 0910 06/20/16 1312 07/11/16 0931  WBC 9.6 4.0 8.3 8.4  HGB 13.7 12.6* 13.7 12.6*  HCT 40.1 36.2* 38.1* 37.1*  PLT 224 222 211 313    COAGS:  Recent Labs  01/23/16 0728 01/31/16 2242  INR 1.25 1.34    BMP:  Recent Labs  02/03/16 0318 02/04/16 0346 02/05/16 0331 02/06/16 0540 04/17/16 0911 06/20/16 1312 07/11/16 0931  NA 143 134* 137 137 132* 132* 131*  K 4.3 4.4 4.1 4.3 4.8 4.7 4.6  CL 107 102 102 104  --   --   --   CO2 '25 26 25 25 '$ 21* 24 25  GLUCOSE 172* 157* 114* 148* 129 116 176*  BUN 26* '19 14 15 '$ 6.0* 8.7 14.8  CALCIUM 9.1 8.6* 8.9 8.8* 9.7 10.8* 10.1  CREATININE 0.69 0.71 0.62 0.53* 0.7 0.7 0.8  GFRNONAA >60 >60 >60 >60  --   --   --   GFRAA >60 >60 >60 >60  --   --   --     LIVER FUNCTION TESTS:  Recent Labs  01/31/16 2242 02/05/16 0331 04/17/16 0911 06/20/16 1312 07/11/16 0931  BILITOT 1.2  --  <0.30 0.34 0.32  AST 39  --  11 10  13  ALT 14*  --  <9 <9 <9  ALKPHOS 66  --  85 77 71  PROT 5.5*  --  6.8 7.1 6.4  ALBUMIN 2.8* 3.1* 3.5 3.8 3.0*    TUMOR MARKERS: No results for input(s): AFPTM, CEA, CA199, CHROMGRNA in the last 8760 hours.  Assessment and Plan: 60 y.o. 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 He is status post radiation and 1 cycle of chemotherapy. Latest CT chest reveals new scattered bilateral pulmonary nodules, interval growth in left upper lobe mass as well as a right hilar lymphadenopathy. He presents today for Port-A-Cath placement for additional planned chemotherapy. Risks and benefits discussed with the patient/sister including, but not limited to bleeding, infection, pneumothorax, or fibrin sheath development and need for additional procedures.All of the patient's questions were answered, patient is agreeable to  proceed.Consent signed and in chart.     Thank you for this interesting consult.  I greatly enjoyed meeting Chad Wang and look forward to participating in their care.  A copy of this report was sent to the requesting provider on this date.  Electronically Signed: D. Rowe Robert 07/13/2016, 12:24 PM   I spent a total of 25 minutes    in face to face in clinical consultation, greater than 50% of which was counseling/coordinating care for port a cath placement

## 2016-07-13 NOTE — Procedures (Signed)
Lung cancer  S/p RT IJ POWER PORT TIP SVC/RA NO COMP STABLE READY FOR USE FULL REPORT IN PACS

## 2016-07-17 ENCOUNTER — Other Ambulatory Visit (HOSPITAL_COMMUNITY): Payer: Self-pay

## 2016-07-17 ENCOUNTER — Other Ambulatory Visit: Payer: Self-pay

## 2016-07-19 ENCOUNTER — Ambulatory Visit: Payer: Self-pay | Admitting: Oncology

## 2016-07-25 ENCOUNTER — Other Ambulatory Visit: Payer: Self-pay | Admitting: Oncology

## 2016-07-25 DIAGNOSIS — C321 Malignant neoplasm of supraglottis: Secondary | ICD-10-CM

## 2016-08-01 ENCOUNTER — Ambulatory Visit (HOSPITAL_BASED_OUTPATIENT_CLINIC_OR_DEPARTMENT_OTHER): Payer: Medicare Other

## 2016-08-01 ENCOUNTER — Telehealth: Payer: Self-pay | Admitting: *Deleted

## 2016-08-01 ENCOUNTER — Other Ambulatory Visit: Payer: Self-pay | Admitting: *Deleted

## 2016-08-01 ENCOUNTER — Telehealth: Payer: Self-pay | Admitting: Oncology

## 2016-08-01 ENCOUNTER — Other Ambulatory Visit (HOSPITAL_BASED_OUTPATIENT_CLINIC_OR_DEPARTMENT_OTHER): Payer: Medicare Other

## 2016-08-01 ENCOUNTER — Ambulatory Visit (HOSPITAL_BASED_OUTPATIENT_CLINIC_OR_DEPARTMENT_OTHER): Payer: Self-pay | Admitting: Oncology

## 2016-08-01 VITALS — BP 114/75 | HR 96 | Temp 98.1°F | Resp 19 | Wt 154.0 lb

## 2016-08-01 DIAGNOSIS — C321 Malignant neoplasm of supraglottis: Secondary | ICD-10-CM

## 2016-08-01 DIAGNOSIS — C3412 Malignant neoplasm of upper lobe, left bronchus or lung: Secondary | ICD-10-CM

## 2016-08-01 DIAGNOSIS — C3490 Malignant neoplasm of unspecified part of unspecified bronchus or lung: Secondary | ICD-10-CM

## 2016-08-01 DIAGNOSIS — Z5111 Encounter for antineoplastic chemotherapy: Secondary | ICD-10-CM

## 2016-08-01 DIAGNOSIS — C329 Malignant neoplasm of larynx, unspecified: Secondary | ICD-10-CM

## 2016-08-01 DIAGNOSIS — C3432 Malignant neoplasm of lower lobe, left bronchus or lung: Secondary | ICD-10-CM

## 2016-08-01 LAB — CBC WITH DIFFERENTIAL/PLATELET
BASO%: 0.5 % (ref 0.0–2.0)
BASOS ABS: 0 10*3/uL (ref 0.0–0.1)
EOS%: 1.5 % (ref 0.0–7.0)
Eosinophils Absolute: 0.1 10*3/uL (ref 0.0–0.5)
HEMATOCRIT: 33.5 % — AB (ref 38.4–49.9)
HEMOGLOBIN: 11.8 g/dL — AB (ref 13.0–17.1)
LYMPH#: 1 10*3/uL (ref 0.9–3.3)
LYMPH%: 15.3 % (ref 14.0–49.0)
MCH: 29.3 pg (ref 27.2–33.4)
MCHC: 35.2 g/dL (ref 32.0–36.0)
MCV: 83.1 fL (ref 79.3–98.0)
MONO#: 0.8 10*3/uL (ref 0.1–0.9)
MONO%: 11.7 % (ref 0.0–14.0)
NEUT#: 4.7 10*3/uL (ref 1.5–6.5)
NEUT%: 71 % (ref 39.0–75.0)
PLATELETS: 215 10*3/uL (ref 140–400)
RBC: 4.03 10*6/uL — ABNORMAL LOW (ref 4.20–5.82)
RDW: 16.1 % — AB (ref 11.0–14.6)
WBC: 6.6 10*3/uL (ref 4.0–10.3)

## 2016-08-01 LAB — COMPREHENSIVE METABOLIC PANEL
ALBUMIN: 3.2 g/dL — AB (ref 3.5–5.0)
ALK PHOS: 71 U/L (ref 40–150)
ALT: 10 U/L (ref 0–55)
ANION GAP: 9 meq/L (ref 3–11)
AST: 17 U/L (ref 5–34)
BUN: 7.3 mg/dL (ref 7.0–26.0)
CALCIUM: 9.8 mg/dL (ref 8.4–10.4)
CO2: 28 mEq/L (ref 22–29)
CREATININE: 0.7 mg/dL (ref 0.7–1.3)
Chloride: 94 mEq/L — ABNORMAL LOW (ref 98–109)
EGFR: >90 mL/min/{1.73_m2} (ref >90)
Glucose: 106 mg/dl (ref 70–140)
Potassium: 5.3 mEq/L — ABNORMAL HIGH (ref 3.5–5.1)
Sodium: 130 mEq/L — ABNORMAL LOW (ref 136–145)
Total Protein: 6.7 g/dL (ref 6.4–8.3)

## 2016-08-01 LAB — TECHNOLOGIST REVIEW

## 2016-08-01 MED ORDER — PROCHLORPERAZINE MALEATE 10 MG PO TABS: 10.0000 mg | ORAL_TABLET | Freq: Four times a day (QID) | ORAL | 2 refills | Status: DC | PRN

## 2016-08-01 MED ORDER — PALONOSETRON HCL INJECTION 0.25 MG/5ML
0.2500 mg | Freq: Once | INTRAVENOUS | Status: AC
  Administered 2016-08-01: 0.25 mg via INTRAVENOUS

## 2016-08-01 MED ORDER — SODIUM CHLORIDE 0.9 % IV SOLN
Freq: Once | INTRAVENOUS | Status: AC
  Administered 2016-08-01: 14:00:00 via INTRAVENOUS

## 2016-08-01 MED ORDER — PALONOSETRON HCL INJECTION 0.25 MG/5ML
INTRAVENOUS | Status: AC
  Filled 2016-08-01: qty 5

## 2016-08-01 MED ORDER — SODIUM CHLORIDE 0.9% FLUSH
10.0000 mL | INTRAVENOUS | Status: DC | PRN
  Administered 2016-08-01: 10 mL
  Filled 2016-08-01: qty 10

## 2016-08-01 MED ORDER — PROCHLORPERAZINE MALEATE 10 MG PO TABS
10.0000 mg | ORAL_TABLET | Freq: Four times a day (QID) | ORAL | 2 refills | Status: DC | PRN
Start: 2016-08-01 — End: 2016-09-07

## 2016-08-01 MED ORDER — SODIUM CHLORIDE 0.9 % IV SOLN
10.0000 mg | Freq: Once | INTRAVENOUS | Status: AC
  Administered 2016-08-01: 10 mg via INTRAVENOUS
  Filled 2016-08-01: qty 1

## 2016-08-01 MED ORDER — CARBOPLATIN CHEMO INJECTION 600 MG/60ML
600.0000 mg | Freq: Once | INTRAVENOUS | Status: AC
  Administered 2016-08-01: 600 mg via INTRAVENOUS
  Filled 2016-08-01: qty 60

## 2016-08-01 MED ORDER — MOMETASONE FURO-FORMOTEROL FUM 200-5 MCG/ACT IN AERO: 2.0000 | INHALATION_SPRAY | Freq: Two times a day (BID) | RESPIRATORY_TRACT | 2 refills | Status: DC

## 2016-08-01 MED ORDER — HEPARIN SOD (PORK) LOCK FLUSH 100 UNIT/ML IV SOLN
500.0000 [IU] | Freq: Once | INTRAVENOUS | Status: AC | PRN
  Administered 2016-08-01: 500 [IU]
  Filled 2016-08-01: qty 5

## 2016-08-01 MED ORDER — SODIUM CHLORIDE 0.9 % IV SOLN
1000.0000 mg/m2 | Freq: Once | INTRAVENOUS | Status: AC
  Administered 2016-08-01: 1824 mg via INTRAVENOUS
  Filled 2016-08-01: qty 47.97

## 2016-08-01 MED FILL — PROCHLORPERAZINE 10 MG TAB: 10 | 7 days supply | Qty: 30 | Fill #0

## 2016-08-01 NOTE — Patient Instructions (Signed)
[facility] Discharge Instructions for Patients Receiving Chemotherapy  Today you received the following chemotherapy agents Gemzar, Carboplatin.   To help prevent nausea and vomiting after your treatment, we encourage you to take your nausea medication as prescribed.    If you develop nausea and vomiting that is not controlled by your nausea medication, call the clinic.   BELOW ARE SYMPTOMS THAT SHOULD BE REPORTED IMMEDIATELY:  *FEVER GREATER THAN 100.5 F  *CHILLS WITH OR WITHOUT FEVER  NAUSEA AND VOMITING THAT IS NOT CONTROLLED WITH YOUR NAUSEA MEDICATION  *UNUSUAL SHORTNESS OF BREATH  *UNUSUAL BRUISING OR BLEEDING  TENDERNESS IN MOUTH AND THROAT WITH OR WITHOUT PRESENCE OF ULCERS  *URINARY PROBLEMS  *BOWEL PROBLEMS  UNUSUAL RASH Items with * indicate a potential emergency and should be followed up as soon as possible.  Feel free to call the clinic you have any questions or concerns. The clinic phone number is [phone].  Please show the CHEMO ALERT CARD at check-in to the Emergency Department and triage nurse.   

## 2016-08-01 NOTE — Progress Notes (Signed)
Per Dr Alen Blew, [place] to continue with today's treatment with serum potassium of 5.3

## 2016-08-01 NOTE — Telephone Encounter (Signed)
Per LOS I have scheduled appts and notified the scheuler

## 2016-08-01 NOTE — Progress Notes (Signed)
Hematology and Oncology Follow Up Visit  Chad Wang [id number] 07/30/1956 60 y.o. 08/01/2016 12:03 PM No PCP Per PatientNo ref. provider found   Principle Diagnosis: 60 year old gentleman with the following diagnoses:   1. 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 This was diagnosed on 01/18/2016. 2. Squamous cell carcinoma of the lung presented with an endobronchial lesion and left lower lung collapse. In March 2017.   Prior Therapy:  He is status post bronchoscopy and laryngitis daily and biopsy of both these lesions in March 2017. He is status post definitive radiation to the larynx and the left lung completed in May 2017.  Current therapy: Carboplatin and gemcitabine chemotherapy with cycle 1 starting on 07/13/2016. He is ready to proceed with cycle 2.  Interim History:  Chad Wang presents today for a follow-up visit with his sister. Since the last visit, he tolerated the first cycle of chemotherapy without any major complications. He did report some mild nausea but no vomiting and occasional dyspepsia. He does report some mild fatigue but have regained all activities of daily living. His respiratory status continues to improve gradually although does require oxygen periodically. He denied any hemoptysis or excessive cough. He denied any severe dyspnea on exertion. His performance status and activity level have not declined.   He does not report any headaches, blurry vision, syncope or seizures. He does not report any fevers chills or sweats. Does not report any chest pain, palpitation or orthopnea. Does not report any hemoptysis but does report some wheezing and dyspnea on exertion. Does not report any nausea, vomiting or abdominal pain. Does not report any frequency urgency or has a. Does not report any skeletal complaints. Remaining review of systems unremarkable.  Medications: I have reviewed the patient's current  medications.  Current Outpatient Prescriptions  Medication Sig Dispense Refill  . albuterol (PROVENTIL HFA;VENTOLIN HFA) 108 (90 Base) MCG/ACT inhaler Inhale 2 puffs into the lungs every 6 (six) hours as needed for wheezing or shortness of breath. 1 Inhaler 2  . bisacodyl (DULCOLAX) 5 MG EC tablet Take 5 mg by mouth daily as needed for moderate constipation.    . chlorpheniramine-HYDROcodone (TUSSIONEX) 10-8 MG/5ML SUER Take 5 mLs by mouth every 12 (twelve) hours. 250 mL 0  . cloZAPine (CLOZARIL) 100 MG tablet Take 100 mg by mouth at bedtime.     . divalproex (DEPAKOTE) 500 MG DR tablet Take 500-1,000 mg by mouth 2 (two) times daily. Takes '500mg'$  in am and '1000mg'$  in pm    . docusate sodium (COLACE) 100 MG capsule Take 1 capsule (100 mg total) by mouth 2 (two) times daily. 10 capsule 0  . ENSURE PLUS (ENSURE PLUS) LIQD Take 237 mLs by mouth.    . feeding supplement, GLUCERNA SHAKE, (GLUCERNA SHAKE) LIQD Take 237 mLs by mouth 2 (two) times daily between meals. 237 mL 30  . gemfibrozil (LOPID) 600 MG tablet Take 600 mg by mouth 2 (two) times daily.    Marland Kitchen lidocaine-prilocaine (EMLA) cream Apply 1 application topically as needed. Apply to port before chemotherapy. 30 g 0  . metFORMIN (GLUCOPHAGE) 1000 MG tablet Take 500 mg by mouth 2 (two) times daily with a meal.     . sucralfate (CARAFATE) 1 g tablet Take 1 tablet (1 g total) by mouth 4 (four) times daily -  with meals and at bedtime. 120 tablet 0  . Wound Cleansers (RADIAPLEX EX) Apply topically. Reported on 04/19/2016    . Wound  Dressings (SONAFINE) Apply 1 application topically 3 (three) times daily. Reported on 04/19/2016    . mometasone-formoterol (DULERA) 200-5 MCG/ACT AERO Inhale 2 puffs into the lungs 2 (two) times daily. 1 Inhaler 2  . prochlorperazine (COMPAZINE) 10 MG tablet Take 1 tablet (10 mg total) by mouth every 6 (six) hours as needed for nausea or vomiting. 30 tablet 2   No current facility-administered medications for this visit.       Allergies: No Known Allergies  Past Medical History, Surgical history, Social history, and Family History were reviewed and updated.   Physical Exam: Blood pressure 114/75, pulse 96, temperature 98.1 F (36.7 C), temperature source Oral, resp. rate 19, weight 154 lb (69.9 kg), SpO2 100 %. ECOG: 1 General appearance: alert and cooperative gentleman without distress. Head: Normocephalic, without obvious abnormality. No oral thrush noted. Neck: no adenopathy. No masses noted. Lymph nodes: Cervical, supraclavicular, and axillary nodes normal. Heart:regular rate and rhythm, S1, S2 normal, no murmur, click, rub or gallop Lung: Clear bilaterally with expiratory wheezes. Good air sounds bilaterally. Abdomin: soft, non-tender, without masses or organomegaly no rebound or guarding. EXT:no erythema, induration, or nodules   Lab Results: Lab Results  Component Value Date   WBC 6.6 08/01/2016   HGB 11.8 (L) 08/01/2016   HCT 33.5 (L) 08/01/2016   MCV 83.1 08/01/2016   PLT 215 08/01/2016     Chemistry      Component Value Date/Time   NA 131 (L) 07/11/2016 0931   K 4.6 07/11/2016 0931   CL 104 02/06/2016 0540   CO2 25 07/11/2016 0931   BUN 14.8 07/11/2016 0931   CREATININE 0.8 07/11/2016 0931      Component Value Date/Time   CALCIUM 10.1 07/11/2016 0931   ALKPHOS 71 07/11/2016 0931   AST 13 07/11/2016 0931   ALT <9 07/11/2016 0931   BILITOT 0.32 07/11/2016 0931     Impression and Plan:   60 year old gentleman with the following issues:  1. Squamous cell carcinoma of the larynx: He presented with hoarseness and a 2.3 cm supraglottic laryngeal mass. He does have significant smoking history which might have contributed to this etiology. His CT scan of the neck did not show any evidence of obvious lymphadenopathy. This was diagnosed in March 2017.  He completed definitive radiation therapy with complete resolution of his tumor. His ENT evaluation did not reveal any tumor  recurrence.   2. Squamous cell carcinoma of the lung presenting with endobronchial lesion and left lower lung collapse. No evidence of clear-cut metastasis in the thorax or any regional lymphadenopathy.   He is status post definitive radiation to the thorax and his PET CT scan obtained on 04/18/2016 showed no residual disease.  His CT scan on 06/21/2016 showed relapse at this time with multiple pulmonary nodules necessitating systemic therapy.His tumor histology is squamous cell carcinoma without PDL expression.  He is currently receiving salvage chemotherapy with carboplatin and gemcitabine and have tolerated it well. He denied any complications that required dose reduction or delay and will proceed with cycle 2 of therapy. Cycle 3 of chemotherapy will be in 3 weeks and repeat imaging studies will be done after that.   3. Dyspnea on exertion: Seems to be improving as is left lung aeration is normal at this time.  4. IV access: Port-A-Cath inserted without complications.  5. Antiemetics: Prescription for Compazine was made available to patient. This will be refilled today.  6. Follow-up: Will be in 3 weeks for cycle 3 of chemotherapy.  [name], MD 10/4/201712:03 PM

## 2016-08-01 NOTE — Telephone Encounter (Signed)
Message sent to chemo scheduler to add chemo. Avs report and  Appointment schedule, given to patient, per 08/01/16 los.

## 2016-08-11 ENCOUNTER — Encounter (HOSPITAL_COMMUNITY): Payer: Self-pay

## 2016-08-11 ENCOUNTER — Inpatient Hospital Stay (HOSPITAL_COMMUNITY)
Admission: EM | Admit: 2016-08-11 | Discharge: 2016-08-14 | DRG: 193 | Disposition: A | Discharge status: Home Health | Payer: Medicare Other | Attending: Family Medicine | Admitting: Family Medicine

## 2016-08-11 ENCOUNTER — Emergency Department (HOSPITAL_COMMUNITY): Payer: Medicare Other

## 2016-08-11 DIAGNOSIS — C3490 Malignant neoplasm of unspecified part of unspecified bronchus or lung: Secondary | ICD-10-CM

## 2016-08-11 DIAGNOSIS — E440 Moderate protein-calorie malnutrition: Secondary | ICD-10-CM | POA: Diagnosis present

## 2016-08-11 DIAGNOSIS — Z833 Family history of diabetes mellitus: Secondary | ICD-10-CM

## 2016-08-11 DIAGNOSIS — J440 Chronic obstructive pulmonary disease with acute lower respiratory infection: Secondary | ICD-10-CM | POA: Diagnosis present

## 2016-08-11 DIAGNOSIS — D638 Anemia in other chronic diseases classified elsewhere: Secondary | ICD-10-CM | POA: Diagnosis present

## 2016-08-11 DIAGNOSIS — Z923 Personal history of irradiation: Secondary | ICD-10-CM

## 2016-08-11 DIAGNOSIS — Z87891 Personal history of nicotine dependence: Secondary | ICD-10-CM

## 2016-08-11 DIAGNOSIS — K219 Gastro-esophageal reflux disease without esophagitis: Secondary | ICD-10-CM | POA: Diagnosis present

## 2016-08-11 DIAGNOSIS — Z9981 Dependence on supplemental oxygen: Secondary | ICD-10-CM

## 2016-08-11 DIAGNOSIS — J189 Pneumonia, unspecified organism: Secondary | ICD-10-CM | POA: Diagnosis not present

## 2016-08-11 DIAGNOSIS — Z79899 Other long term (current) drug therapy: Secondary | ICD-10-CM

## 2016-08-11 DIAGNOSIS — Z66 Do not resuscitate: Secondary | ICD-10-CM | POA: Diagnosis present

## 2016-08-11 DIAGNOSIS — Y95 Nosocomial condition: Secondary | ICD-10-CM | POA: Diagnosis present

## 2016-08-11 DIAGNOSIS — R0600 Dyspnea, unspecified: Secondary | ICD-10-CM | POA: Diagnosis not present

## 2016-08-11 DIAGNOSIS — F209 Schizophrenia, unspecified: Secondary | ICD-10-CM | POA: Diagnosis present

## 2016-08-11 DIAGNOSIS — D649 Anemia, unspecified: Secondary | ICD-10-CM | POA: Diagnosis present

## 2016-08-11 DIAGNOSIS — I10 Essential (primary) hypertension: Secondary | ICD-10-CM | POA: Diagnosis present

## 2016-08-11 DIAGNOSIS — Z7984 Long term (current) use of oral hypoglycemic drugs: Secondary | ICD-10-CM

## 2016-08-11 DIAGNOSIS — Z6822 Body mass index (BMI) 22.0-22.9, adult: Secondary | ICD-10-CM

## 2016-08-11 DIAGNOSIS — J9621 Acute and chronic respiratory failure with hypoxia: Secondary | ICD-10-CM

## 2016-08-11 DIAGNOSIS — E785 Hyperlipidemia, unspecified: Secondary | ICD-10-CM | POA: Diagnosis present

## 2016-08-11 DIAGNOSIS — R778 Other specified abnormalities of plasma proteins: Secondary | ICD-10-CM | POA: Diagnosis present

## 2016-08-11 DIAGNOSIS — J9611 Chronic respiratory failure with hypoxia: Secondary | ICD-10-CM | POA: Diagnosis present

## 2016-08-11 DIAGNOSIS — Z801 Family history of malignant neoplasm of trachea, bronchus and lung: Secondary | ICD-10-CM

## 2016-08-11 DIAGNOSIS — I248 Other forms of acute ischemic heart disease: Secondary | ICD-10-CM | POA: Diagnosis present

## 2016-08-11 DIAGNOSIS — R7989 Other specified abnormal findings of blood chemistry: Secondary | ICD-10-CM | POA: Diagnosis present

## 2016-08-11 DIAGNOSIS — R262 Difficulty in walking, not elsewhere classified: Secondary | ICD-10-CM

## 2016-08-11 DIAGNOSIS — E871 Hypo-osmolality and hyponatremia: Secondary | ICD-10-CM | POA: Diagnosis present

## 2016-08-11 DIAGNOSIS — J449 Chronic obstructive pulmonary disease, unspecified: Secondary | ICD-10-CM | POA: Diagnosis present

## 2016-08-11 DIAGNOSIS — E119 Type 2 diabetes mellitus without complications: Secondary | ICD-10-CM

## 2016-08-11 DIAGNOSIS — Z8249 Family history of ischemic heart disease and other diseases of the circulatory system: Secondary | ICD-10-CM

## 2016-08-11 DIAGNOSIS — C329 Malignant neoplasm of larynx, unspecified: Secondary | ICD-10-CM | POA: Diagnosis present

## 2016-08-11 DIAGNOSIS — D696 Thrombocytopenia, unspecified: Secondary | ICD-10-CM | POA: Diagnosis present

## 2016-08-11 MED ORDER — ALBUTEROL SULFATE (2.5 MG/3ML) 0.083% IN NEBU
5.0000 mg | INHALATION_SOLUTION | Freq: Once | RESPIRATORY_TRACT | Status: AC
  Administered 2016-08-11: 5 mg via RESPIRATORY_TRACT
  Filled 2016-08-11: qty 6

## 2016-08-11 NOTE — ED Triage Notes (Addendum)
Patient c/o SOB since tonight.  Patient states has had a productive cough with white mucous and chest pain off and on.  Patient also states he has felt weak. Patient wears [place] at home on 5L.  Patient breathing even and unlabored NAD at this time. 100% [place] 5L.

## 2016-08-12 ENCOUNTER — Other Ambulatory Visit: Payer: Self-pay

## 2016-08-12 ENCOUNTER — Encounter (HOSPITAL_COMMUNITY): Payer: Self-pay | Admitting: Radiology

## 2016-08-12 ENCOUNTER — Emergency Department (HOSPITAL_COMMUNITY): Payer: Medicare Other

## 2016-08-12 DIAGNOSIS — E440 Moderate protein-calorie malnutrition: Secondary | ICD-10-CM | POA: Diagnosis present

## 2016-08-12 DIAGNOSIS — E119 Type 2 diabetes mellitus without complications: Secondary | ICD-10-CM | POA: Diagnosis present

## 2016-08-12 DIAGNOSIS — F209 Schizophrenia, unspecified: Secondary | ICD-10-CM | POA: Diagnosis present

## 2016-08-12 DIAGNOSIS — Z7984 Long term (current) use of oral hypoglycemic drugs: Secondary | ICD-10-CM | POA: Diagnosis not present

## 2016-08-12 DIAGNOSIS — J189 Pneumonia, unspecified organism: Secondary | ICD-10-CM | POA: Diagnosis present

## 2016-08-12 DIAGNOSIS — D696 Thrombocytopenia, unspecified: Secondary | ICD-10-CM | POA: Diagnosis present

## 2016-08-12 DIAGNOSIS — R748 Abnormal levels of other serum enzymes: Secondary | ICD-10-CM

## 2016-08-12 DIAGNOSIS — D638 Anemia in other chronic diseases classified elsewhere: Secondary | ICD-10-CM | POA: Diagnosis not present

## 2016-08-12 DIAGNOSIS — J440 Chronic obstructive pulmonary disease with acute lower respiratory infection: Secondary | ICD-10-CM | POA: Diagnosis present

## 2016-08-12 DIAGNOSIS — Y95 Nosocomial condition: Secondary | ICD-10-CM | POA: Diagnosis present

## 2016-08-12 DIAGNOSIS — Z87891 Personal history of nicotine dependence: Secondary | ICD-10-CM | POA: Diagnosis not present

## 2016-08-12 DIAGNOSIS — I248 Other forms of acute ischemic heart disease: Secondary | ICD-10-CM | POA: Diagnosis present

## 2016-08-12 DIAGNOSIS — Z6822 Body mass index (BMI) 22.0-22.9, adult: Secondary | ICD-10-CM | POA: Diagnosis not present

## 2016-08-12 DIAGNOSIS — R7989 Other specified abnormal findings of blood chemistry: Secondary | ICD-10-CM

## 2016-08-12 DIAGNOSIS — R778 Other specified abnormalities of plasma proteins: Secondary | ICD-10-CM | POA: Diagnosis present

## 2016-08-12 DIAGNOSIS — I10 Essential (primary) hypertension: Secondary | ICD-10-CM | POA: Diagnosis present

## 2016-08-12 DIAGNOSIS — E871 Hypo-osmolality and hyponatremia: Secondary | ICD-10-CM | POA: Diagnosis present

## 2016-08-12 DIAGNOSIS — Z66 Do not resuscitate: Secondary | ICD-10-CM | POA: Diagnosis present

## 2016-08-12 DIAGNOSIS — Z923 Personal history of irradiation: Secondary | ICD-10-CM | POA: Diagnosis not present

## 2016-08-12 DIAGNOSIS — C3490 Malignant neoplasm of unspecified part of unspecified bronchus or lung: Secondary | ICD-10-CM | POA: Diagnosis present

## 2016-08-12 DIAGNOSIS — C329 Malignant neoplasm of larynx, unspecified: Secondary | ICD-10-CM | POA: Diagnosis present

## 2016-08-12 DIAGNOSIS — Z9981 Dependence on supplemental oxygen: Secondary | ICD-10-CM | POA: Diagnosis not present

## 2016-08-12 DIAGNOSIS — Z801 Family history of malignant neoplasm of trachea, bronchus and lung: Secondary | ICD-10-CM | POA: Diagnosis not present

## 2016-08-12 DIAGNOSIS — D649 Anemia, unspecified: Secondary | ICD-10-CM | POA: Diagnosis present

## 2016-08-12 DIAGNOSIS — J9611 Chronic respiratory failure with hypoxia: Secondary | ICD-10-CM | POA: Diagnosis not present

## 2016-08-12 DIAGNOSIS — K219 Gastro-esophageal reflux disease without esophagitis: Secondary | ICD-10-CM | POA: Diagnosis present

## 2016-08-12 DIAGNOSIS — R0600 Dyspnea, unspecified: Secondary | ICD-10-CM | POA: Diagnosis present

## 2016-08-12 DIAGNOSIS — E785 Hyperlipidemia, unspecified: Secondary | ICD-10-CM | POA: Diagnosis present

## 2016-08-12 DIAGNOSIS — J449 Chronic obstructive pulmonary disease, unspecified: Secondary | ICD-10-CM | POA: Diagnosis not present

## 2016-08-12 DIAGNOSIS — Z8249 Family history of ischemic heart disease and other diseases of the circulatory system: Secondary | ICD-10-CM | POA: Diagnosis not present

## 2016-08-12 DIAGNOSIS — J9621 Acute and chronic respiratory failure with hypoxia: Secondary | ICD-10-CM | POA: Diagnosis present

## 2016-08-12 DIAGNOSIS — Z833 Family history of diabetes mellitus: Secondary | ICD-10-CM | POA: Diagnosis not present

## 2016-08-12 LAB — CBC WITH DIFFERENTIAL/PLATELET
BASOS ABS: 0 10*3/uL (ref 0.0–0.1)
Basophils Relative: 0 %
EOS PCT: 0 %
Eosinophils Absolute: 0 10*3/uL (ref 0.0–0.7)
HEMATOCRIT: 24.6 % — AB (ref 39.0–52.0)
Hemoglobin: 9.2 g/dL — ABNORMAL LOW (ref 13.0–17.0)
LYMPHS PCT: 5 %
Lymphs Abs: 0.3 10*3/uL — ABNORMAL LOW (ref 0.7–4.0)
MCH: 29.2 pg (ref 26.0–34.0)
MCHC: 37.4 g/dL — AB (ref 30.0–36.0)
MCV: 78.1 fL (ref 78.0–100.0)
MONO ABS: 0.4 10*3/uL (ref 0.1–1.0)
MONOS PCT: 9 %
NEUTROS ABS: 4.2 10*3/uL (ref 1.7–7.7)
Neutrophils Relative %: 86 %
PLATELETS: 68 10*3/uL — AB (ref 150–400)
RBC: 3.15 MIL/uL — ABNORMAL LOW (ref 4.22–5.81)
RDW: 15.3 % (ref 11.5–15.5)
WBC: 4.9 10*3/uL (ref 4.0–10.5)

## 2016-08-12 LAB — BASIC METABOLIC PANEL
Anion gap: 8 (ref 5–15)
BUN: 7 mg/dL (ref 6–20)
CALCIUM: 8.8 mg/dL — AB (ref 8.9–10.3)
CO2: 25 mmol/L (ref 22–32)
Chloride: 90 mmol/L — ABNORMAL LOW (ref 101–111)
Creatinine, Ser: 0.38 mg/dL — ABNORMAL LOW (ref 0.61–1.24)
GFR calc Af Amer: >60 mL/min (ref >60)
GLUCOSE: 126 mg/dL — AB (ref 65–99)
Potassium: 4 mmol/L (ref 3.5–5.1)
Sodium: 123 mmol/L — ABNORMAL LOW (ref 135–145)

## 2016-08-12 LAB — GLUCOSE, CAPILLARY
GLUCOSE-CAPILLARY: 169 mg/dL — AB (ref 65–99)
GLUCOSE-CAPILLARY: 95 mg/dL (ref 65–99)
Glucose-Capillary: 84 mg/dL (ref 65–99)
Glucose-Capillary: 95 mg/dL (ref 65–99)

## 2016-08-12 LAB — TROPONIN I
TROPONIN I: 0.06 ng/mL — AB (ref <0.03)
TROPONIN I: 0.04 ng/mL — AB (ref <0.03)
Troponin I: 0.03 ng/mL (ref <0.03)

## 2016-08-12 LAB — PROCALCITONIN: Procalcitonin: <0.1 ng/mL

## 2016-08-12 LAB — OSMOLALITY: Osmolality: 269 mOsm/kg — ABNORMAL LOW (ref 275–295)

## 2016-08-12 LAB — BRAIN NATRIURETIC PEPTIDE: B Natriuretic Peptide: 77.8 pg/mL (ref 0.0–100.0)

## 2016-08-12 MED ORDER — INSULIN ASPART 100 UNIT/ML ~~LOC~~ SOLN: 0.0000 [IU] | Freq: Every day | SUBCUTANEOUS | Status: DC

## 2016-08-12 MED ORDER — BISACODYL 5 MG PO TBEC: 5.0000 mg | DELAYED_RELEASE_TABLET | Freq: Every day | ORAL | Status: DC | PRN

## 2016-08-12 MED ORDER — IOPAMIDOL (ISOVUE-370) INJECTION 76%
100.0000 mL | Freq: Once | INTRAVENOUS | Status: AC | PRN
  Administered 2016-08-12: 100 mL via INTRAVENOUS

## 2016-08-12 MED ORDER — VANCOMYCIN HCL 10 G IV SOLR
1500.0000 mg | Freq: Once | INTRAVENOUS | Status: AC
  Administered 2016-08-12: 1500 mg via INTRAVENOUS
  Filled 2016-08-12: qty 1500

## 2016-08-12 MED ORDER — ACETAMINOPHEN 650 MG RE SUPP: 650.0000 mg | Freq: Four times a day (QID) | RECTAL | Status: DC | PRN

## 2016-08-12 MED ORDER — MOMETASONE FURO-FORMOTEROL FUM 200-5 MCG/ACT IN AERO
2.0000 | INHALATION_SPRAY | Freq: Two times a day (BID) | RESPIRATORY_TRACT | Status: DC
  Administered 2016-08-12 – 2016-08-13 (×3): 2 via RESPIRATORY_TRACT
  Filled 2016-08-12: qty 8.8

## 2016-08-12 MED ORDER — GEMFIBROZIL 600 MG PO TABS
600.0000 mg | ORAL_TABLET | Freq: Two times a day (BID) | ORAL | Status: DC
  Administered 2016-08-12 – 2016-08-14 (×5): 600 mg via ORAL
  Filled 2016-08-12 (×5): qty 1

## 2016-08-12 MED ORDER — INSULIN ASPART 100 UNIT/ML ~~LOC~~ SOLN
0.0000 [IU] | Freq: Three times a day (TID) | SUBCUTANEOUS | Status: DC
  Administered 2016-08-12: 2 [IU] via SUBCUTANEOUS
  Administered 2016-08-13 (×2): 1 [IU] via SUBCUTANEOUS

## 2016-08-12 MED ORDER — DIVALPROEX SODIUM 250 MG PO DR TAB
500.0000 mg | DELAYED_RELEASE_TABLET | Freq: Every day | ORAL | Status: DC
  Administered 2016-08-12 – 2016-08-14 (×3): 500 mg via ORAL
  Filled 2016-08-12 (×4): qty 2

## 2016-08-12 MED ORDER — ACETAMINOPHEN 325 MG PO TABS
650.0000 mg | ORAL_TABLET | Freq: Four times a day (QID) | ORAL | Status: DC | PRN
  Administered 2016-08-12 – 2016-08-13 (×2): 650 mg via ORAL
  Filled 2016-08-12 (×2): qty 2

## 2016-08-12 MED ORDER — NITROGLYCERIN 0.4 MG SL SUBL: 0.4000 mg | SUBLINGUAL_TABLET | SUBLINGUAL | Status: DC | PRN

## 2016-08-12 MED ORDER — ENSURE PLUS PO LIQD: 237.0000 mL | Freq: Two times a day (BID) | ORAL | Status: DC

## 2016-08-12 MED ORDER — SODIUM CHLORIDE 0.9% FLUSH
3.0000 mL | Freq: Two times a day (BID) | INTRAVENOUS | Status: DC
  Administered 2016-08-12 – 2016-08-13 (×4): 3 mL via INTRAVENOUS

## 2016-08-12 MED ORDER — CLOZAPINE 100 MG PO TABS
100.0000 mg | ORAL_TABLET | Freq: Every day | ORAL | Status: DC
  Administered 2016-08-12 – 2016-08-13 (×2): 100 mg via ORAL
  Filled 2016-08-12 (×2): qty 1

## 2016-08-12 MED ORDER — DOCUSATE SODIUM 100 MG PO CAPS
100.0000 mg | ORAL_CAPSULE | Freq: Two times a day (BID) | ORAL | Status: DC
  Administered 2016-08-12 – 2016-08-14 (×5): 100 mg via ORAL
  Filled 2016-08-12 (×5): qty 1

## 2016-08-12 MED ORDER — PIPERACILLIN-TAZOBACTAM 3.375 G IVPB
3.3750 g | Freq: Three times a day (TID) | INTRAVENOUS | Status: AC
  Administered 2016-08-12 – 2016-08-13 (×4): 3.375 g via INTRAVENOUS
  Filled 2016-08-12 (×4): qty 50

## 2016-08-12 MED ORDER — ALBUTEROL SULFATE (2.5 MG/3ML) 0.083% IN NEBU: 2.5000 mg | INHALATION_SOLUTION | RESPIRATORY_TRACT | Status: DC | PRN

## 2016-08-12 MED ORDER — ENOXAPARIN SODIUM 40 MG/0.4ML ~~LOC~~ SOLN: 40.0000 mg | SUBCUTANEOUS | Status: DC

## 2016-08-12 MED ORDER — PIPERACILLIN-TAZOBACTAM 3.375 G IVPB 30 MIN
3.3750 g | Freq: Once | INTRAVENOUS | Status: AC
  Administered 2016-08-12: 3.375 g via INTRAVENOUS
  Filled 2016-08-12 (×2): qty 50

## 2016-08-12 MED ORDER — ENSURE ENLIVE PO LIQD
237.0000 mL | Freq: Two times a day (BID) | ORAL | Status: DC
  Administered 2016-08-12 – 2016-08-14 (×4): 237 mL via ORAL

## 2016-08-12 MED ORDER — VANCOMYCIN HCL IN DEXTROSE 1-5 GM/200ML-% IV SOLN
1000.0000 mg | Freq: Two times a day (BID) | INTRAVENOUS | Status: AC
  Administered 2016-08-12 – 2016-08-13 (×3): 1000 mg via INTRAVENOUS
  Filled 2016-08-12 (×3): qty 200

## 2016-08-12 MED ORDER — DIVALPROEX SODIUM 250 MG PO DR TAB: 500.0000 mg | DELAYED_RELEASE_TABLET | Freq: Two times a day (BID) | ORAL | Status: DC

## 2016-08-12 MED ORDER — DIVALPROEX SODIUM 250 MG PO DR TAB
1000.0000 mg | DELAYED_RELEASE_TABLET | Freq: Every day | ORAL | Status: DC
  Administered 2016-08-12 – 2016-08-13 (×2): 1000 mg via ORAL
  Filled 2016-08-12 (×2): qty 4

## 2016-08-12 NOTE — ED Provider Notes (Signed)
[facility] DEPT Provider Note   CSN: [id number] Arrival date & time: 08/11/16  2308  By signing my name below, I, Maud Deed. Royston Sinner, attest that this documentation has been prepared under the direction and in the presence of Varney Biles, MD.  Electronically Signed: Maud Deed. Royston Sinner, ED Scribe. 08/12/16. 12:35 AM.   History   Chief Complaint Chief Complaint  Patient presents with  . Shortness of Breath   The history is provided by the patient. No language interpreter was used.    HPI Comments: CIRO TASHIRO is a 60 y.o. male with a PMHx of HTN, lung/larynx cancer, DM, and hyperlipidemia who presents to the Emergency Department complaining of intermittent, unchanged shortness of breath onset 9:00 PM this evening that woke him from slee.. Brother states pt takes Clozapine 100 mg as prescribed which makes him sleepy at baseline. However, pt states he called his son this evening after taking his Clozapine and states it was difficult for him to catch his breath. No aggravating or alleviating factors reported. Pt also reports some mild chest pain, however, he states this is baseline for him. No recent fever or chills. Currently pt is undergoing chemotherapy treatments for history of cancer. He has has 2 treatments so far with next treatment scheduled in 2 weeks.  PCP: No PCP Per Patient    Past Medical History:  Diagnosis Date  . Cancer ([place]) 2017   lung / larynx  . Diabetes mellitus without complication ([place])   . GERD (gastroesophageal reflux disease)   . Hyperlipemia   . Hypertension   . Schizophrenia [facility])     Patient Active Problem List   Diagnosis Date Noted  . Palliative care encounter   . DNR (do not resuscitate) discussion   . 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 COPD (chronic obstructive pulmonary disease) ([employer]) 02/07/2016  . Schizophrenia ([place]) 02/07/2016  . Pressure ulcer 02/01/2016  . Acute  respiratory failure with hypoxemia ([place])   . Back pain   . Laryngeal mass   . Malnutrition of moderate degree 01/19/2016  . Primary cancer of left upper lobe of lung ([place]) 01/19/2016  . Essential hypertension 01/19/2016  . Pulmonary emphysema ([place])   . Community acquired pneumonia 01/18/2016  . Hyponatremia 01/18/2016  . DM type 2 (diabetes mellitus, type 2) ([place]) 01/18/2016  . Tobacco abuse 01/18/2016    Past Surgical History:  Procedure Laterality Date  . IR GENERIC HISTORICAL  07/13/2016   IR FLUORO GUIDE PORT INSERTION RIGHT 07/13/2016 Greggory Keen, MD [facility]-INTERV RAD  . IR GENERIC HISTORICAL  07/13/2016   IR US GUIDE VASC ACCESS RIGHT 07/13/2016 Greggory Keen, MD [facility]-INTERV RAD  . KNEE ARTHROSCOPY    . MICROLARYNGOSCOPY WITH CO2 LASER AND EXCISION OF VOCAL CORD LESION N/A 01/25/2016   Procedure: MICROLARYNGOSCOPY WITH CO2 LASER, DEBULKING OF LARYNX MASS ;  Surgeon: Melida Quitter, MD;  Location: [facility];  Service: ENT;  Laterality: N/A;  . VIDEO BRONCHOSCOPY Bilateral 01/20/2016   Procedure: VIDEO BRONCHOSCOPY WITHOUT FLUORO;  Surgeon: Rigoberto Noel, MD;  Location: [facility];  Service: Cardiopulmonary;  Laterality: Bilateral;       Home Medications    Prior to Admission medications   Medication Sig Start Date End Date Taking? Authorizing Provider  albuterol (PROVENTIL HFA;VENTOLIN HFA) 108 (90 Base) MCG/ACT inhaler Inhale 2 puffs into the lungs every 6 (six) hours as needed for wheezing or shortness of breath. 01/28/16  Yes Nayana  Abrol, MD  bisacodyl (DULCOLAX) 5 MG EC tablet Take 5 mg by mouth daily as needed for moderate constipation.   Yes Historical Provider, MD  cloZAPine (CLOZARIL) 100 MG tablet Take 100 mg by mouth at bedtime.    Yes Historical Provider, MD  divalproex (DEPAKOTE) 500 MG DR tablet Take 500-1,000 mg by mouth 2 (two) times daily. Takes '500mg'$  in am and '1000mg'$  in pm   Yes Historical Provider, MD  docusate sodium (COLACE) 100 MG capsule Take 1 capsule (100 mg total)  by mouth 2 (two) times daily. 01/28/16  Yes Reyne Dumas, MD  ENSURE PLUS (ENSURE PLUS) LIQD Take 237 mLs by mouth.   Yes Historical Provider, MD  gemfibrozil (LOPID) 600 MG tablet Take 600 mg by mouth 2 (two) times daily.   Yes Historical Provider, MD  lidocaine-prilocaine (EMLA) cream Apply 1 application topically as needed. Apply to port before chemotherapy. 06/29/16  Yes Wyatt Portela, MD  metFORMIN (GLUCOPHAGE) 1000 MG tablet Take 500 mg by mouth 2 (two) times daily with a meal.    Yes Historical Provider, MD  mometasone-formoterol (DULERA) 200-5 MCG/ACT AERO Inhale 2 puffs into the lungs 2 (two) times daily. 08/01/16  Yes Wyatt Portela, MD  prochlorperazine (COMPAZINE) 10 MG tablet Take 1 tablet (10 mg total) by mouth every 6 (six) hours as needed for nausea or vomiting. 08/01/16  Yes Wyatt Portela, MD  Wound Cleansers (RADIAPLEX EX) Apply topically. Reported on 04/19/2016   Yes Historical Provider, MD  Wound Dressings (SONAFINE) Apply 1 application topically 3 (three) times daily. Reported on 04/19/2016 02/24/16  Yes Tyler Pita, MD  chlorpheniramine-HYDROcodone (TUSSIONEX) 10-8 MG/5ML SUER Take 5 mLs by mouth every 12 (twelve) hours. Patient not taking: Reported on 08/12/2016 01/28/16   Reyne Dumas, MD  feeding supplement, GLUCERNA SHAKE, (GLUCERNA SHAKE) LIQD Take 237 mLs by mouth 2 (two) times daily between meals. Patient not taking: Reported on 08/12/2016 01/28/16   Reyne Dumas, MD  sucralfate (CARAFATE) 1 g tablet Take 1 tablet (1 g total) by mouth 4 (four) times daily -  with meals and at bedtime. Patient not taking: Reported on 08/12/2016 03/12/16   Hayden Pedro, PA-C    Family History Family History  Problem Relation Age of Onset  . CAD Mother   . Lung cancer Father   . CAD Brother   . Diabetes Other   . Stroke Neg Hx     Social History Social History  Substance Use Topics  . Smoking status: Former Smoker    Types: Cigarettes    Quit date: 12/28/2015  . Smokeless  tobacco: Never Used  . Alcohol use Yes     Allergies   Review of patient's allergies indicates no known allergies.   Review of Systems Review of Systems  Constitutional: Negative for chills and fever.  Respiratory: Positive for shortness of breath.   Cardiovascular: Positive for chest pain.  All other systems reviewed and are negative.    Physical Exam Updated Vital Signs BP 111/88   Pulse 96   Temp (S) 97.7 F (36.5 C) (Oral)   Resp 20   SpO2 (S) (!) 87% Comment: Pt's O2 sat dropped to 87% on O2 at 4L/min while ambulating.  Pt also verbalized shortness of breath after a few steps.  Physical Exam  Constitutional: He is oriented to person, place, and time. He appears well-developed and well-nourished.  HENT:  Head: Normocephalic and atraumatic.  Eyes: EOM are normal.  Neck: Normal range of motion.  Cardiovascular: Regular rhythm and intact distal pulses.  Tachycardia present.   Murmur (Systolic) heard. Pulmonary/Chest: Effort normal. No respiratory distress.  Diminished breath sounds particularly in the L base  Abdominal: Soft. He exhibits no distension. There is no tenderness.  Musculoskeletal: Normal range of motion. He exhibits no edema or tenderness.  No pitting edema. No unilateral swelling or calf tenderness  Neurological: He is alert and oriented to person, place, and time.  Skin: Skin is warm and dry.  Psychiatric: He has a normal mood and affect. Judgment normal.  Nursing note and vitals reviewed.    ED Treatments / Results   DIAGNOSTIC STUDIES: Oxygen Saturation is 98% on RA, Normal by my interpretation.    COORDINATION OF CARE: 12:34 AM-Will give breathing treatment. Will order imaging. Discussed treatment plan with pt at bedside and pt agreed to plan.     Labs (all labs ordered are listed, but only abnormal results are displayed) Labs Reviewed  BASIC METABOLIC PANEL - Abnormal; Notable for the following:       Result Value   Sodium 123 (*)     Chloride 90 (*)    Glucose, Bld 126 (*)    Creatinine, Ser 0.38 (*)    Calcium 8.8 (*)    All other components within normal limits  CBC WITH DIFFERENTIAL/PLATELET - Abnormal; Notable for the following:    RBC 3.15 (*)    Hemoglobin 9.2 (*)    HCT 24.6 (*)    MCHC 37.4 (*)    Platelets 68 (*)    Lymphs Abs 0.3 (*)    All other components within normal limits  TROPONIN I - Abnormal; Notable for the following:    Troponin I 0.03 (*)    All other components within normal limits  BRAIN NATRIURETIC PEPTIDE    EKG  EKG Interpretation None       Radiology Dg Chest 2 View  Result Date: 08/12/2016 CLINICAL DATA:  Acute onset of productive cough and shortness of breath. Generalized weakness. Initial encounter. EXAM: CHEST  2 VIEW COMPARISON:  Chest radiograph performed 02/04/2016, and CT of the chest performed 06/21/2016 FINDINGS: The lungs are well-aerated. Vascular congestion is noted. Increased interstitial markings raise concern for pulmonary edema. This is superimposed on the patient's bilateral pulmonary masses, reflecting known metastatic disease. Superimposed pneumonia could have a similar appearance. The heart is normal in size; the mediastinal contour is within normal limits. A right-sided chest port is noted ending about the distal SVC. No acute osseous abnormalities are seen. IMPRESSION: Vascular congestion noted. Increased interstitial markings raise concern for pulmonary edema, superimposed on the patient's known bilateral metastatic disease. Superimposed pneumonia could have a similar appearance. Electronically Signed   By: Garald Balding M.D.   On: 08/12/2016 00:13   Ct Angio Chest Pe W And/or Wo Contrast  Result Date: 08/12/2016 CLINICAL DATA:  60 y/o M; productive cough, shortness of breath, and chest pain. EXAM: CT ANGIOGRAPHY CHEST WITH CONTRAST TECHNIQUE: Multidetector CT imaging of the chest was performed using the standard protocol during bolus administration of  intravenous contrast. Multiplanar CT image reconstructions and MIPs were obtained to evaluate the vascular anatomy. CONTRAST:  100 cc Isovue 370 COMPARISON:  06/21/2016 CT chest. FINDINGS: Cardiovascular: Normal heart size. Small pericardial effusion. Normal caliber thoracic aorta. Minimal aortic atherosclerosis with arch calcifications. Normal caliber main pulmonary artery. Satisfactory opacification of the pulmonary artery to the segmental level. No pulmonary embolus. Mediastinum/Nodes: Nonspecific right lower and upper peritracheal lymphadenopathy. Lungs/Pleura: Left greater than right  perihilar consolidation, left upper lobe consolidation, and diffuse ground-glass opacities is most likely to represent multi focal pneumonia. Additionally, there are multiple discrete pulmonary nodules which are either new or increased in size in comparison with the prior CT of the chest: 1. Right lower lobe, 26 mm, series 7 image 56 2. Right hilum, 31 mm, image 49 3. Right lower lobe, 11 mm, image 50 4. Right lower lobe, 7 mm image 62 5. Right middle lobe, 7 mm image 68 6. Right apex, 16 mm, image 17 4 cm left upper lobe mass is grossly stable. Small bilateral pleural effusions. Background of moderate to severe pulmonary emphysema. Minor interlobular septal thickening within the lung apices. Upper Abdomen: No acute abnormality. Musculoskeletal: No acute osseous abnormality identified. Moderate degenerative changes of the thoracic spine and mild degenerative loss of height of mid thoracic vertebral bodies. Review of the MIP images confirms the above findings. IMPRESSION: 1. Areas of consolidation and ground-glass opacity are suspicious for pneumonia. A component of infiltrate neoplasm and lymphangitic carcinomatosis is also possible. 2. Numerous pulmonary nodules predominantly in the right lung are either new or increased in size from the prior CT of the chest probably representing worsening metastatic disease. Grossly stable left  upper lobe mass. 3. Minor interlobular septal thickening is consistent with a component of interstitial edema. 4. Small bilateral pleural effusions. 5. Background of moderate to severe pulmonary emphysema. 6. No evidence for pulmonary embolus. Electronically Signed   By: Kristine Garbe M.D.   On: 08/12/2016 03:22    Procedures .Critical Care Performed by: Varney Biles Authorized by: Varney Biles   Critical care provider statement:    Critical care time (minutes):  55   Critical care time was exclusive of:  Separately billable procedures and treating other patients   Critical care was necessary to treat or prevent imminent or life-threatening deterioration of the following conditions:  Respiratory failure   Critical care was time spent personally by me on the following activities:  Blood draw for specimens, development of treatment plan with patient or surrogate, discussions with consultants, evaluation of patient's response to treatment, examination of patient, obtaining history from patient or surrogate, ordering and performing treatments and interventions, ordering and review of laboratory studies, ordering and review of radiographic studies, pulse oximetry, re-evaluation of patient's condition and review of old charts   (including critical care time)  Medications Ordered in ED Medications  albuterol (PROVENTIL) (2.5 MG/3ML) 0.083% nebulizer solution 5 mg (5 mg Nebulization Given 08/11/16 2338)  iopamidol (ISOVUE-370) 76 % injection 100 mL (100 mLs Intravenous Contrast Given 08/12/16 0251)     Initial Impression / Assessment and Plan / ED Course  I have reviewed the triage vital signs and the nursing notes.  Pertinent labs & imaging results that were available during my care of the patient were reviewed by me and considered in my medical decision making (see chart for details).  Clinical Course  Comment By Time  Pt desats with ambulation and was in severe resp  distress. We will admit. Clinically, no pneumonia-  no new fevers, cough. No elevated WC. No antibiotics started here. Varney Biles, MD 10/15 [phone]    Pt with cc of dib. Hx of laryngeal/lung CA, on chemo, s/p radiation. Pt's lung exam shows poor aeration at the L base. Clinical suspicion for PNA is low. CXR shows pulm edema- no hx of severe CHF and clicnically pt doesn't present as CHF either, so we will get CT. Final Clinical Impressions(s) / ED Diagnoses  Final diagnoses:  Acute on chronic respiratory failure with hypoxia (HCC)  Malignant neoplasm of lung, unspecified laterality, unspecified part of lung ([place])    New Prescriptions New Prescriptions   No medications on file  I personally performed the services described in this documentation, which was scribed in my presence. The recorded information has been reviewed and is accurate.     Varney Biles, MD 08/12/16 [phone]

## 2016-08-12 NOTE — H&P (Signed)
History and Physical  Patient Name: Chad Wang     XHB:[id number]    DOB: Aug 27, 1956    DOA: 08/11/2016 PCP: No PCP Per Patient  Oncology: [name], MD      Patient coming from: Home  Chief Complaint: Dyspnea  HPI: Chad Wang is a 60 y.o. male with a past medical history significant for NIDDM, COPD, schizophrenia on Clozaril, and laryngeal cancer and lung cancer on chemo and chronic respiratory failure with home O2 5L who presents with dyspnea.  The patient was in his usual health (has chronic dyspnea on exertion from COPD and lung cancer and wears home O2), until tonight, he was woken from sleep short of breath.  He was short of breath with any exertion, accompanied by chest discomfort.  This was moderate intensity, "dull" or "tight" but also present sometimes at rest.  He had a new cough productive of whitish sputum, but no purulent sputum, no fever, chills, rigors, hemoptysis, no malaise.    Because this dyspnea was more than his baseline, he came to the ER.  ED course: -Afebrile, heart rate 103, respirations 22, BP 124/83, pulse ox 100% on home O2, 85% on room air -Na 123, K 4.0, Cr 0.38, WBC 4.9K, Hgb 9.2 (down from 11), platelets 68K (down from >200K) -Troponin 0.03, ECG with sinus tachcyardia and LVH no ST changes -BNP normal -CXR showed patchy opacities, and CT angiogram showed no PE or dissection but new progression of his cancer and some multifocal ground glass opacities consistent with pneumonia or carcinomatosis -TRH were asked to evaluate   He was diagnosed with both laryngeal and squamous lung cancer in March of this year. He underwent definitive radiation therapy for his laryngeal cancer without recurrence. He underwent radiation therapy for his lung cancer. In August he was found to have recurrent lung cancer, and was started on salvage chemotherapy with carboplatin and gemcitabine, recently completed cycle 2.                 ROS: Review  of Systems  Constitutional: Negative for chills, fever and malaise/fatigue.  HENT: Negative for congestion and sore throat.   Respiratory: Positive for cough and shortness of breath. Negative for hemoptysis, sputum production and wheezing.   Cardiovascular: Positive for chest pain. Negative for palpitations and leg swelling.  All other systems reviewed and are negative.         Past Medical History:  Diagnosis Date  . Cancer ([place]) 2017   lung / larynx  . Diabetes mellitus without complication ([place])   . GERD (gastroesophageal reflux disease)   . Hyperlipemia   . Hypertension   . Schizophrenia [facility])     Past Surgical History:  Procedure Laterality Date  . IR GENERIC HISTORICAL  07/13/2016   IR FLUORO GUIDE PORT INSERTION RIGHT 07/13/2016 Greggory Keen, MD [facility]INTER[facility]  . IR GENERIC HISTORICAL  07/13/2016   IR US GUIDE VASC ACCESS RIGHT 07/13/2016 Greggory Keen, MD [facility]  . KNEE ARTHROSCOPY    . MICROLARYNGOSCOPY WITH CO2 LASER AND EXCISION OF VOCAL CORD LESION N/A 01/25/2016   Procedure: MICROLARYNGOSCOPY WITH CO2 LASER, DEBULKING OF LARYNX MASS ;  Surgeon: Melida Quitter, MD;  Location: [place];  Service: ENT;  Laterality: N/A;  . VIDEO BRONCHOSCOPY Bilateral 01/20/2016   Procedure: VIDEO BRONCHOSCOPY WITHOUT FLUORO;  Surgeon: Rigoberto Noel, MD;  Location: [place];  Service: Cardiopulmonary;  Laterality: Bilateral;    Social History: Patient lives with his brother.  The patient walks  unassisted.  He is a former smoker, quit in the last year.    No Known Allergies  Family history: family history includes CAD in his brother and mother; Diabetes in his other; Lung cancer in his father.  Prior to Admission medications   Medication Sig Start Date End Date Taking? Authorizing Provider  albuterol (PROVENTIL HFA;VENTOLIN HFA) 108 (90 Base) MCG/ACT inhaler Inhale 2 puffs into the lungs every 6 (six) hours as needed for wheezing or shortness of breath. 01/28/16  Yes Reyne Dumas, MD   bisacodyl (DULCOLAX) 5 MG EC tablet Take 5 mg by mouth daily as needed for moderate constipation.   Yes Historical Provider, MD  cloZAPine (CLOZARIL) 100 MG tablet Take 100 mg by mouth at bedtime.    Yes Historical Provider, MD  divalproex (DEPAKOTE) 500 MG DR tablet Take 500-1,000 mg by mouth 2 (two) times daily. Takes '500mg'$  in am and '1000mg'$  in pm   Yes Historical Provider, MD  docusate sodium (COLACE) 100 MG capsule Take 1 capsule (100 mg total) by mouth 2 (two) times daily. 01/28/16  Yes Reyne Dumas, MD  ENSURE PLUS (ENSURE PLUS) LIQD Take 237 mLs by mouth.   Yes Historical Provider, MD  gemfibrozil (LOPID) 600 MG tablet Take 600 mg by mouth 2 (two) times daily.   Yes Historical Provider, MD  lidocaine-prilocaine (EMLA) cream Apply 1 application topically as needed. Apply to port before chemotherapy. 06/29/16  Yes Wyatt Portela, MD  metFORMIN (GLUCOPHAGE) 1000 MG tablet Take 500 mg by mouth 2 (two) times daily with a meal.    Yes Historical Provider, MD  mometasone-formoterol (DULERA) 200-5 MCG/ACT AERO Inhale 2 puffs into the lungs 2 (two) times daily. 08/01/16  Yes Wyatt Portela, MD  prochlorperazine (COMPAZINE) 10 MG tablet Take 1 tablet (10 mg total) by mouth every 6 (six) hours as needed for nausea or vomiting. 08/01/16  Yes Wyatt Portela, MD  Wound Cleansers (RADIAPLEX EX) Apply topically. Reported on 04/19/2016   Yes Historical Provider, MD  Wound Dressings (SONAFINE) Apply 1 application topically 3 (three) times daily. Reported on 04/19/2016 02/24/16  Yes Tyler Pita, MD       Physical Exam: BP 111/88   Pulse 96   Temp (S) 97.7 F (36.5 C) (Oral)   Resp 20   SpO2 (S) (!) 87% Comment: Pt's O2 sat dropped to 87% on O2 at 4L/min while ambulating.  Pt also verbalized shortness of breath after a few steps. General appearance: Thin adult male, alert and in no acute distress.   Eyes: Anicteric, conjunctiva pink, lids and lashes normal. PERRL.    ENT: No nasal deformity, discharge,  epistaxis.  Hearing normal. OP moist without lesions.   Neck: No neck masses.  Trachea midline.  No thyromegaly/tenderness. Lymph: No cervical or supraclavicular lymphadenopathy. Skin: Warm and dry.  No jaundice.  No suspicious rashes or lesions. Cardiac: Tachcyardic, nl S1-S2.  Capillary refill is brisk.  No JVD.  No LE edema.   Respiratory: Normal respiratory rate and rhythm.  Coarse rhonchi.  No rales. No wheezes. Abdomen: Abdomen soft.  No TTP. No ascites, distension, hepatosplenomegaly.   MSK: No deformities or effusions.  No cyanosis or clubbing. Neuro: Cranial nerves normal.  Sensation intact to light touch. Speech is fluent.  Muscle strength nromal.    Psych: Sensorium intact and responding to questions, attention normal.  Behavior appropriate.  Affect normal.  Judgment and insight appear normal.     Labs on Admission:  I have personally reviewed following  labs and imaging studies: CBC:  Recent Labs Lab 08/12/16 0057  WBC 4.9  NEUTROABS 4.2  HGB 9.2*  HCT 24.6*  MCV 78.1  PLT 68*   Basic Metabolic Panel:  Recent Labs Lab 08/12/16 0057  NA 123*  K 4.0  CL 90*  CO2 25  GLUCOSE 126*  BUN 7  CREATININE 0.38*  CALCIUM 8.8*   GFR: Estimated Creatinine Clearance: 94.7 mL/min (by C-G formula based on SCr of 0.38 mg/dL (L)).  Liver Function Tests: No results for input(s): AST, ALT, ALKPHOS, BILITOT, PROT, ALBUMIN in the last 168 hours. No results for input(s): LIPASE, AMYLASE in the last 168 hours. No results for input(s): AMMONIA in the last 168 hours. Coagulation Profile: No results for input(s): INR, PROTIME in the last 168 hours. Cardiac Enzymes:  Recent Labs Lab 08/12/16 0057  TROPONINI 0.03*   BNP (last 3 results) No results for input(s): PROBNP in the last 8760 hours. HbA1C: No results for input(s): HGBA1C in the last 72 hours. CBG: No results for input(s): GLUCAP in the last 168 hours. Lipid Profile: No results for input(s): CHOL, HDL, LDLCALC,  TRIG, CHOLHDL, LDLDIRECT in the last 72 hours. Thyroid Function Tests: No results for input(s): TSH, T4TOTAL, FREET4, T3FREE, THYROIDAB in the last 72 hours. Anemia Panel: No results for input(s): VITAMINB12, FOLATE, FERRITIN, TIBC, IRON, RETICCTPCT in the last 72 hours. Sepsis Labs: Invalid input(s): PROCALCITONIN, LACTICIDVEN No results found for this or any previous visit (from the past 240 hour(s)).       Radiological Exams on Admission: Personally reviewed: Dg Chest 2 View  Result Date: 08/12/2016 CLINICAL DATA:  Acute onset of productive cough and shortness of breath. Generalized weakness. Initial encounter. EXAM: CHEST  2 VIEW COMPARISON:  Chest radiograph performed 02/04/2016, and CT of the chest performed 06/21/2016 FINDINGS: The lungs are well-aerated. Vascular congestion is noted. Increased interstitial markings raise concern for pulmonary edema. This is superimposed on the patient's bilateral pulmonary masses, reflecting known metastatic disease. Superimposed pneumonia could have a similar appearance. The heart is normal in size; the mediastinal contour is within normal limits. A right-sided chest port is noted ending about the distal SVC. No acute osseous abnormalities are seen. IMPRESSION: Vascular congestion noted. Increased interstitial markings raise concern for pulmonary edema, superimposed on the patient's known bilateral metastatic disease. Superimposed pneumonia could have a similar appearance. Electronically Signed   By: Garald Balding M.D.   On: 08/12/2016 00:13   Ct Angio Chest Pe W And/or Wo Contrast  Result Date: 08/12/2016 CLINICAL DATA:  60 y/o M; productive cough, shortness of breath, and chest pain. EXAM: CT ANGIOGRAPHY CHEST WITH CONTRAST TECHNIQUE: Multidetector CT imaging of the chest was performed using the standard protocol during bolus administration of intravenous contrast. Multiplanar CT image reconstructions and MIPs were obtained to evaluate the vascular  anatomy. CONTRAST:  100 cc Isovue 370 COMPARISON:  06/21/2016 CT chest. FINDINGS: Cardiovascular: Normal heart size. Small pericardial effusion. Normal caliber thoracic aorta. Minimal aortic atherosclerosis with arch calcifications. Normal caliber main pulmonary artery. Satisfactory opacification of the pulmonary artery to the segmental level. No pulmonary embolus. Mediastinum/Nodes: Nonspecific right lower and upper peritracheal lymphadenopathy. Lungs/Pleura: Left greater than right perihilar consolidation, left upper lobe consolidation, and diffuse ground-glass opacities is most likely to represent multi focal pneumonia. Additionally, there are multiple discrete pulmonary nodules which are either new or increased in size in comparison with the prior CT of the chest: 1. Right lower lobe, 26 mm, series 7 image 56 2. Right hilum,  31 mm, image 49 3. Right lower lobe, 11 mm, image 50 4. Right lower lobe, 7 mm image 62 5. Right middle lobe, 7 mm image 68 6. Right apex, 16 mm, image 17 4 cm left upper lobe mass is grossly stable. Small bilateral pleural effusions. Background of moderate to severe pulmonary emphysema. Minor interlobular septal thickening within the lung apices. Upper Abdomen: No acute abnormality. Musculoskeletal: No acute osseous abnormality identified. Moderate degenerative changes of the thoracic spine and mild degenerative loss of height of mid thoracic vertebral bodies. Review of the MIP images confirms the above findings. IMPRESSION: 1. Areas of consolidation and ground-glass opacity are suspicious for pneumonia. A component of infiltrate neoplasm and lymphangitic carcinomatosis is also possible. 2. Numerous pulmonary nodules predominantly in the right lung are either new or increased in size from the prior CT of the chest probably representing worsening metastatic disease. Grossly stable left upper lobe mass. 3. Minor interlobular septal thickening is consistent with a component of interstitial  edema. 4. Small bilateral pleural effusions. 5. Background of moderate to severe pulmonary emphysema. 6. No evidence for pulmonary embolus. Electronically Signed   By: Kristine Garbe M.D.   On: 08/12/2016 03:22    EKG: Independently reviewed. Rate 103, QTc 455, LVH.  No ST changes.    Assessment/Plan Principal Problem:   Elevated troponin Active Problems:   Hyponatremia   DM type 2 (diabetes mellitus, type 2) (HCC)   Malnutrition of moderate degree   Essential hypertension   Squamous cell lung and laryngeal cancer    COPD (chronic obstructive pulmonary disease) (HCC)   Schizophrenia (HCC)   Chronic respiratory failure with hypoxia (HCC)   Anemia in other chronic diseases classified elsewhere   Thrombocytopenia ([place])  1. Elevated troponin:  Presents with acute on chronic dyspnea, worse than baseline. Mild vague chest pain. Overall medium cardiovascular risk with diabetes, smoking, formerly hypertension, hyperlipidemia. -Trend troponin -Monitor on telemetry       2. Hyponatremia:  Appears euvolemic. -Check urine sodium -Check urine and serum osmolality  3. Thrombocytopenia:  Unclear etiology. On carboplatin and clozapine. -Repeat platelet count tomorrow  4. New opacities on CT chest:  -Defer antibiotics for now -Monitor for fever -Check Procalcitonin, reduced sensitivity in chemotherapy/cancer -Low threshold to start antibiotics  5. NIDDM:  -Hold metformin for 2 days after contrast -SSI with meals  6. COPD:  -Continue Dulera -Albuterol when necessary  7. Malnutrition of moderate degree:  -Glucerna shakes  8. Schizophrenia:  -Continue clozapine and Depakote         DVT prophylaxis: Lovenox  Code Status: DO NOT RESUSCITATE  Family Communication: None presetn  Disposition Plan: Anticipate monitor troponins.  Repeat CBC tomorrow.  Monitor for fever. Consults called: None Admission status: OBS, telemetry At the point of initial evaluation, it  is my clinical opinion that admission for OBSERVATION is reasonable and necessary because the patient's presenting complaints in the context of their chronic conditions represent sufficient risk of deterioration or significant morbidity to constitute reasonable grounds for close observation in the hospital setting, but that the patient may be medically stable for discharge from the hospital within 24 to 48 hours.    Medical decision making: Patient seen at 5:40 AM on 08/12/2016.  The patient was discussed with Dr. Kathrynn Humble.  What exists of the patient's chart was reviewed in depth and summarized above.  Clinical condition: stable.        EYAL GREENHAW [facility] Pager [phone]

## 2016-08-12 NOTE — Progress Notes (Signed)
08/11/2016 11:32 PM  08/12/2016 11:45 AM  Linna Hoff Mike Craze was seen and examined.  The H&P by the admitting provider , orders, imaging was reviewed.  Please see orders.  Will continue to follow.   Murvin Natal, MD [facility]

## 2016-08-12 NOTE — ED Notes (Signed)
Pt's contact:  Marylyn Ishihara (brother)---- tel# [phone] (cell); tel# [phone] (home)

## 2016-08-12 NOTE — ED Notes (Signed)
Pt unable to tolerate ambulation---- pt gets easily short of breath with ambulation.  Pt's O2 sat went down to 87% on O2 at 4 Lmin via nasal cannula while ambulating.

## 2016-08-12 NOTE — Progress Notes (Signed)
Pharmacy Antibiotic Follow-up Note  Chad Wang is a 60 y.o. year-old male admitted on 08/11/2016.  The patient is currently on day 1 of Vancomycin & Zosyn for HCAP.  Assessment/Plan: Vancomycin '1500mg'$  x1, then 1gm  IV every 12 hours.  Goal trough 15-20 mcg/mL. Zosyn 3.375g IV q8h (4 hour infusion).  Temp (24hrs), Avg:97.7 F (36.5 C), Min:97.6 F (36.4 C), Max:97.7 F (36.5 C)   Recent Labs Lab 08/12/16 0057  WBC 4.9    Recent Labs Lab 08/12/16 0057  CREATININE 0.38*   Estimated Creatinine Clearance: 94.7 mL/min (by C-G formula based on SCr of 0.38 mg/dL (L)).    No Known Allergies  Antimicrobials this admission: 10/15 Vanc >>  10/15 Zosyn >>   Levels/dose changes this admission:  Microbiology results: No cx ordered at this time   Thank you for allowing pharmacy to be a part of this patient's care.  Minda Ditto PharmD 08/12/2016 11:53 AM

## 2016-08-13 DIAGNOSIS — E871 Hypo-osmolality and hyponatremia: Secondary | ICD-10-CM

## 2016-08-13 DIAGNOSIS — E440 Moderate protein-calorie malnutrition: Secondary | ICD-10-CM

## 2016-08-13 DIAGNOSIS — J189 Pneumonia, unspecified organism: Principal | ICD-10-CM

## 2016-08-13 DIAGNOSIS — J449 Chronic obstructive pulmonary disease, unspecified: Secondary | ICD-10-CM

## 2016-08-13 DIAGNOSIS — J9611 Chronic respiratory failure with hypoxia: Secondary | ICD-10-CM

## 2016-08-13 DIAGNOSIS — F209 Schizophrenia, unspecified: Secondary | ICD-10-CM

## 2016-08-13 DIAGNOSIS — C3490 Malignant neoplasm of unspecified part of unspecified bronchus or lung: Secondary | ICD-10-CM

## 2016-08-13 DIAGNOSIS — I10 Essential (primary) hypertension: Secondary | ICD-10-CM

## 2016-08-13 DIAGNOSIS — D696 Thrombocytopenia, unspecified: Secondary | ICD-10-CM

## 2016-08-13 DIAGNOSIS — D638 Anemia in other chronic diseases classified elsewhere: Secondary | ICD-10-CM

## 2016-08-13 LAB — CBC
HEMATOCRIT: 25.3 % — AB (ref 39.0–52.0)
Hemoglobin: 9 g/dL — ABNORMAL LOW (ref 13.0–17.0)
MCH: 28.5 pg (ref 26.0–34.0)
MCHC: 35.6 g/dL (ref 30.0–36.0)
MCV: 80.1 fL (ref 78.0–100.0)
PLATELETS: 56 10*3/uL — AB (ref 150–400)
RBC: 3.16 MIL/uL — AB (ref 4.22–5.81)
RDW: 15.8 % — ABNORMAL HIGH (ref 11.5–15.5)
WBC: 4.3 10*3/uL (ref 4.0–10.5)

## 2016-08-13 LAB — BASIC METABOLIC PANEL
ANION GAP: 6 (ref 5–15)
BUN: 7 mg/dL (ref 6–20)
CO2: 26 mmol/L (ref 22–32)
Calcium: 8.8 mg/dL — ABNORMAL LOW (ref 8.9–10.3)
Chloride: 100 mmol/L — ABNORMAL LOW (ref 101–111)
Creatinine, Ser: 0.6 mg/dL — ABNORMAL LOW (ref 0.61–1.24)
GFR calc non Af Amer: >60 mL/min (ref >60)
GLUCOSE: 104 mg/dL — AB (ref 65–99)
Potassium: 4 mmol/L (ref 3.5–5.1)
Sodium: 132 mmol/L — ABNORMAL LOW (ref 135–145)

## 2016-08-13 LAB — GLUCOSE, CAPILLARY
GLUCOSE-CAPILLARY: 133 mg/dL — AB (ref 65–99)
GLUCOSE-CAPILLARY: 148 mg/dL — AB (ref 65–99)
Glucose-Capillary: 81 mg/dL (ref 65–99)
Glucose-Capillary: 107 mg/dL — ABNORMAL HIGH (ref 65–99)

## 2016-08-13 LAB — SODIUM, URINE, RANDOM: Sodium, Ur: 60 mmol/L

## 2016-08-13 LAB — OSMOLALITY, URINE: Osmolality, Ur: 224 mOsm/kg — ABNORMAL LOW (ref 300–900)

## 2016-08-13 MED ORDER — AMOXICILLIN-POT CLAVULANATE 875-125 MG PO TABS
1.0000 | ORAL_TABLET | Freq: Two times a day (BID) | ORAL | Status: DC
  Administered 2016-08-14: 1 via ORAL
  Filled 2016-08-13: qty 1

## 2016-08-13 NOTE — Progress Notes (Signed)
Initial Nutrition Assessment  DOCUMENTATION CODES:   Not applicable  INTERVENTION:  Continue Ensure Enlive po BID, each supplement provides 350 kcal and 20 grams of protein.  Encouraged adequate intake of calories and protein through small, frequent meals in setting of poor appetite and nausea.   Patient amenable to RD ordering snacks (pudding, yogurt) to come between meals.  Will follow-up with education on nausea/vomiting medical nutrition therapy when patient is feeling better before discharge. Education is not appropriate at this time.  NUTRITION DIAGNOSIS:   Increased nutrient needs related to catabolic illness, cancer and cancer related treatments as evidenced by estimated needs.  GOAL:   Patient will meet greater than or equal to 90% of their needs  MONITOR:   PO intake, Supplement acceptance, Labs, Weight trends, I & O's  REASON FOR ASSESSMENT:   Malnutrition Screening Tool    ASSESSMENT:   60 y.o. male with a past medical history significant for NIDDM, COPD, schizophrenia on Clozaril, and laryngeal cancer and lung cancer on chemo and chronic respiratory failure with home O2 5L who presents with dyspnea.   -Cycle 2 of chemotherapy started 10/4.   Patient reports that his appetite has been poor since the end of March when he was diagnosed with cancer. Patient feeling very unwell, so probing questions regarding appetite not answered. Reports nausea. Denies abdominal pain or difficulty chewing/swallowing.  Usual Intake: 1 meal per day: cereal or oatmeal 1 Ensure per day  Patient reports UBW was about 167 lbs. He lost a significant amount of weight and has since been gaining back. This aligns with wt history in chart. He lost 26 lbs (16% body weight) over 2 months (3/22-5/18), which is significant for time frame. He has since been regaining and is at 154 lbs now.  Patient is amenable to increasing Ensure to 2x/day and trying to eat small, frequent meals - asked RD to  order pudding and yogurt for snacks.  Meal Completion: 20-30%  Medications reviewed and include: Colace, Novolog sliding scale TID with meals and daily at bedtime.   Labs reviewed: CBG 81-169 past 24 hrs, Sodium 132, Chloride 100.  Nutrition-Focused physical exam completed. Findings are no fat depletion, and mild muscle depletion. Unable to assess lower body as patient was wearing pants under hospital gown.   Discussed plan with RN.   Diet Order:  Diet Heart Room service appropriate? Yes; Fluid consistency: Thin; Fluid restriction: 1500 mL Fluid  Skin:  Reviewed, no issues  Last BM:  08/11/2016  Height:   Ht Readings from Last 1 Encounters:  08/12/16 '5\' 10"'$  (1.778 m)    Weight:   Wt Readings from Last 1 Encounters:  08/12/16 154 lb (69.9 kg)    Ideal Body Weight:  75.45 kg  BMI:  Body mass index is 22.1 kg/m.  Estimated Nutritional Needs:   Kcal:  2100-2300 (30-33 kcal/kg)  Protein:  85-105 grams (1.2-1.5 grams/kg)  Fluid:  2.1-2.3 L/day  EDUCATION NEEDS:   Education needs addressed  Willey Blade, MS, RD, LDN Pager: [phone] After Hours Pager: [phone]

## 2016-08-13 NOTE — Evaluation (Signed)
Physical Therapy Evaluation Patient Details Name: Chad Wang MRN: [id number] DOB: 15-Nov-1955 Today's Date: 08/13/2016   History of Present Illness  60 yo male admitted with elevated troponiin. Hx of DM, COPD, schizophrenia, lung ca, laryngeal cancer.   Clinical Impression  On eval, pt was Min guard assist for mobility. He walked ~70'x2 in hallway. Dyspnea 2/4 with some congestion noted. Pt tolerated activity fairly well. Discussed d/c plan-he plans to return home. Recommend HHPT follow up if pt is agreeable. Will follow and progress activity as tolerated.     Follow Up Recommendations Home health PT;Supervision - Intermittent    Equipment Recommendations  None recommended by PT    Recommendations for Other Services       Precautions / Restrictions Precautions Precautions: Fall Precaution Comments: O2 dep at baseline Restrictions Weight Bearing Restrictions: No      Mobility  Bed Mobility Overal bed mobility: Independent                Transfers Overall transfer level: Independent                  Ambulation/Gait Ambulation/Gait assistance: Min guard Ambulation Distance (Feet): 70 Feet (x2)   Gait Pattern/deviations: Step-through pattern;Decreased stride length     General Gait Details: slightly unsteady at times. Pt pushed IV pole/O2 tank during ambulation. O2 sats 99% on 6L O2 (Pt requested 5L but tank does not have 5L so used 6L)  Financial trader [employer] (Stroke Patients Only)       Balance                                             Pertinent Vitals/Pain Pain Assessment: No/denies pain    Home Living Family/patient expects to be discharged to:: Private residence Living Arrangements: Alone Available Help at Discharge: Available PRN/intermittently Type of Home: Apartment Home Access: Level entry     Home Layout: Two level Home Equipment: Environmental consultant - 2 wheels      Prior  Function Level of Independence: Independent               Hand Dominance        Extremity/Trunk Assessment   Upper Extremity Assessment: Overall WFL for tasks assessed           Lower Extremity Assessment: Generalized weakness      Cervical / Trunk Assessment: Normal  Communication   Communication: No difficulties  Cognition Arousal/Alertness: Awake/alert Behavior During Therapy: WFL for tasks assessed/performed Overall Cognitive Status: Within Functional Limits for tasks assessed                      General Comments      Exercises     Assessment/Plan    PT Assessment Patient needs continued PT services  PT Problem List Decreased mobility;Decreased activity tolerance          PT Treatment Interventions Gait training;Therapeutic activities;Therapeutic exercise;Patient/family education;Functional mobility training    PT Goals (Current goals can be found in the Care Plan section)  Acute Rehab PT Goals Patient Stated Goal: none stated PT Goal Formulation: With patient Time For Goal Achievement: 08/27/16 Potential to Achieve Goals: Good    Frequency Min 3X/week   Barriers to discharge  Co-evaluation               End of Session Equipment Utilized During Treatment: Oxygen Activity Tolerance: Patient tolerated treatment well Patient left: in bed;with call bell/phone within reach           Time: 5945-8592 PT Time Calculation (min) (ACUTE ONLY): 27 min   Charges:   PT Evaluation $PT Eval Low Complexity: 1 Procedure PT Treatments $Gait Training: 8-22 mins   PT G Codes:        Weston Anna, MPT Pager: [phone]

## 2016-08-13 NOTE — Progress Notes (Addendum)
PROGRESS NOTE   Chad Wang  NWG:[id number]  DOB: 03/25/1956  DOA: 08/11/2016 PCP: No PCP Per Patient  Hospital course: Chad Wang is a 60 y.o. male with a past medical history significant for NIDDM, COPD, schizophrenia on Clozaril, and laryngeal cancer and lung cancer on chemo and chronic respiratory failure with home O2 5L who presents with dyspnea.  Assessment & Plan:  1. Acute on Chronic Hypoxic Respiratory Failure likely secondary to Pneumonia and Lung Cancer  Presents with acute on chronic dyspnea, worse than baseline. Mild vague chest pain. Overall medium cardiovascular risk with diabetes, smoking, formerly hypertension, hyperlipidemia. -Troponins slightly elevated but flat consistent with demand ischemia  2. Hyponatremia:  Appears euvolemic.  Sodium levels improving 10/16.  3. Thrombocytopenia:  Unclear etiology. On carboplatin and clozapine. -Holding all heparins  4. HCAP -Improving clinically with IV antibiotics, planning to switch to oral antibiotics 10/17  5. NIDDM:  -Hold metformin for 2 days after contrast -SSI with meals  CBG (last 3)   Recent Labs  08/12/16 2041 08/13/16 0759 08/13/16 1128  GLUCAP 95 81 133*   6. COPD:  -Continue Dulera -Albuterol when necessary  7. Malnutrition of moderate degree:  -Glucerna shakes  8. Schizophrenia:  -Continue clozapine and Depakote  DVT prophylaxis: Lovenox  Code Status: DO NOT RESUSCITATE   Disposition Plan: 10/17 Consults called: None Admission status: OBS, telemetry  Subjective: Patient says he is feeling better today but not quite back to baseline.  Objective: Vitals:   08/12/16 1345 08/12/16 2032 08/13/16 0524 08/13/16 0745  BP: (!) 116/54  100/68   Pulse: 92  76 89  Resp:   18 18  Temp: 97.5 F (36.4 C)  97.9 F (36.6 C)   TempSrc: Oral  Oral   SpO2: 100% 99% 98% 97%  Weight:      Height:        Intake/Output Summary (Last 24 hours) at 08/13/16 0959 Last data  filed at 08/13/16 0900  Gross per 24 hour  Intake             1850 ml  Output             1200 ml  Net              650 ml   Filed Weights   08/12/16 0822  Weight: 69.9 kg (154 lb)    Exam:  General exam: Thin adult male, alert and in no acute distress.  Respiratory system: scattered rhonchi and crackles. No increased work of breathing. Cardiovascular system: S1 & S2 heard, RRR. No JVD, murmurs, gallops, clicks or pedal edema. Gastrointestinal system: Abdomen is nondistended, soft and nontender. Normal bowel sounds heard. Central nervous system: Alert and oriented. No focal neurological deficits. Extremities: no cyanosis.  Data Reviewed: Basic Metabolic Panel:  Recent Labs Lab 08/12/16 0057 08/13/16 0325  NA 123* 132*  K 4.0 4.0  CL 90* 100*  CO2 25 26  GLUCOSE 126* 104*  BUN 7 7  CREATININE 0.38* 0.60*  CALCIUM 8.8* 8.8*   Liver Function Tests: No results for input(s): AST, ALT, ALKPHOS, BILITOT, PROT, ALBUMIN in the last 168 hours. No results for input(s): LIPASE, AMYLASE in the last 168 hours. No results for input(s): AMMONIA in the last 168 hours. CBC:  Recent Labs Lab 08/12/16 0057 08/13/16 0325  WBC 4.9 4.3  NEUTROABS 4.2  --   HGB 9.2* 9.0*  HCT 24.6* 25.3*  MCV 78.1 80.1  PLT 68* 56*   Cardiac Enzymes:  Recent Labs Lab 08/12/16 0057 08/12/16 0930 08/12/16 1301  TROPONINI 0.03* 0.06* 0.04*   CBG (last 3)   Recent Labs  08/12/16 1653 08/12/16 2041 08/13/16 0759  GLUCAP 95 95 81   No results found for this or any previous visit (from the past 240 hour(s)).   Studies: Dg Chest 2 View  Result Date: 08/12/2016 CLINICAL DATA:  Acute onset of productive cough and shortness of breath. Generalized weakness. Initial encounter. EXAM: CHEST  2 VIEW COMPARISON:  Chest radiograph performed 02/04/2016, and CT of the chest performed 06/21/2016 FINDINGS: The lungs are well-aerated. Vascular congestion is noted. Increased interstitial markings raise  concern for pulmonary edema. This is superimposed on the patient's bilateral pulmonary masses, reflecting known metastatic disease. Superimposed pneumonia could have a similar appearance. The heart is normal in size; the mediastinal contour is within normal limits. A right-sided chest port is noted ending about the distal SVC. No acute osseous abnormalities are seen. IMPRESSION: Vascular congestion noted. Increased interstitial markings raise concern for pulmonary edema, superimposed on the patient's known bilateral metastatic disease. Superimposed pneumonia could have a similar appearance. Electronically Signed   By: Garald Balding M.D.   On: 08/12/2016 00:13   Ct Angio Chest Pe W And/or Wo Contrast  Result Date: 08/12/2016 CLINICAL DATA:  60 y/o M; productive cough, shortness of breath, and chest pain. EXAM: CT ANGIOGRAPHY CHEST WITH CONTRAST TECHNIQUE: Multidetector CT imaging of the chest was performed using the standard protocol during bolus administration of intravenous contrast. Multiplanar CT image reconstructions and MIPs were obtained to evaluate the vascular anatomy. CONTRAST:  100 cc Isovue 370 COMPARISON:  06/21/2016 CT chest. FINDINGS: Cardiovascular: Normal heart size. Small pericardial effusion. Normal caliber thoracic aorta. Minimal aortic atherosclerosis with arch calcifications. Normal caliber main pulmonary artery. Satisfactory opacification of the pulmonary artery to the segmental level. No pulmonary embolus. Mediastinum/Nodes: Nonspecific right lower and upper peritracheal lymphadenopathy. Lungs/Pleura: Left greater than right perihilar consolidation, left upper lobe consolidation, and diffuse ground-glass opacities is most likely to represent multi focal pneumonia. Additionally, there are multiple discrete pulmonary nodules which are either new or increased in size in comparison with the prior CT of the chest: 1. Right lower lobe, 26 mm, series 7 image 56 2. Right hilum, 31 mm, image 49  3. Right lower lobe, 11 mm, image 50 4. Right lower lobe, 7 mm image 62 5. Right middle lobe, 7 mm image 68 6. Right apex, 16 mm, image 17 4 cm left upper lobe mass is grossly stable. Small bilateral pleural effusions. Background of moderate to severe pulmonary emphysema. Minor interlobular septal thickening within the lung apices. Upper Abdomen: No acute abnormality. Musculoskeletal: No acute osseous abnormality identified. Moderate degenerative changes of the thoracic spine and mild degenerative loss of height of mid thoracic vertebral bodies. Review of the MIP images confirms the above findings. IMPRESSION: 1. Areas of consolidation and ground-glass opacity are suspicious for pneumonia. A component of infiltrate neoplasm and lymphangitic carcinomatosis is also possible. 2. Numerous pulmonary nodules predominantly in the right lung are either new or increased in size from the prior CT of the chest probably representing worsening metastatic disease. Grossly stable left upper lobe mass. 3. Minor interlobular septal thickening is consistent with a component of interstitial edema. 4. Small bilateral pleural effusions. 5. Background of moderate to severe pulmonary emphysema. 6. No evidence for pulmonary embolus. Electronically Signed   By: Kristine Garbe M.D.   On: 08/12/2016 03:22   Scheduled Meds: . [START ON 08/14/2016] amoxicillin-clavulanate  1 tablet Oral Q12H  . cloZAPine  100 mg Oral QHS  . divalproex  1,000 mg Oral QHS  . divalproex  500 mg Oral Daily  . docusate sodium  100 mg Oral BID  . feeding supplement (ENSURE ENLIVE)  237 mL Oral BID BM  . gemfibrozil  600 mg Oral BID AC  . insulin aspart  0-5 Units Subcutaneous QHS  . insulin aspart  0-9 Units Subcutaneous TID WC  . mometasone-formoterol  2 puff Inhalation BID  . piperacillin-tazobactam (ZOSYN)  IV  3.375 g Intravenous Q8H  . sodium chloride flush  3 mL Intravenous Q12H  . vancomycin  1,000 mg Intravenous Q12H   Continuous  Infusions:   Principal Problem:   Elevated troponin Active Problems:   Hyponatremia   DM type 2 (diabetes mellitus, type 2) (HCC)   Malnutrition of moderate degree   Essential hypertension   Squamous cell lung and laryngeal cancer    COPD (chronic obstructive pulmonary disease) (HCC)   Schizophrenia (HCC)   Chronic respiratory failure with hypoxia (HCC)   Anemia in other chronic diseases classified elsewhere   Thrombocytopenia ([place])   HCAP (healthcare-associated pneumonia)  Time spent:   Irwin Brakeman, MD, FAAFP [facility] Pager [phone]  If 7PM-7AM, please contact night-coverage [email or web address] Password [email or web address] 08/13/2016, 9:59 AM    LOS: 1 day

## 2016-08-14 DIAGNOSIS — E1100 Type 2 diabetes mellitus with hyperosmolarity without nonketotic hyperglycemic-hyperosmolar coma (NKHHC): Secondary | ICD-10-CM

## 2016-08-14 DIAGNOSIS — J9621 Acute and chronic respiratory failure with hypoxia: Secondary | ICD-10-CM

## 2016-08-14 DIAGNOSIS — R262 Difficulty in walking, not elsewhere classified: Secondary | ICD-10-CM

## 2016-08-14 DIAGNOSIS — C3490 Malignant neoplasm of unspecified part of unspecified bronchus or lung: Secondary | ICD-10-CM

## 2016-08-14 LAB — GLUCOSE, CAPILLARY
GLUCOSE-CAPILLARY: 139 mg/dL — AB (ref 65–99)
Glucose-Capillary: 85 mg/dL (ref 65–99)

## 2016-08-14 LAB — PROCALCITONIN

## 2016-08-14 MED ORDER — AMOXICILLIN-POT CLAVULANATE 875-125 MG PO TABS: 1.0000 | ORAL_TABLET | Freq: Two times a day (BID) | ORAL | 0 refills | Status: AC

## 2016-08-14 NOTE — Progress Notes (Signed)
Pt selected [facility] for HHPT.  Referral given to in house rep.

## 2016-08-14 NOTE — Discharge Summary (Signed)
Physician Discharge Summary  Chad Wang KVQ:[id number] DOB: [birth year] DOA: 08/11/2016  PCP: Alen Blew  Admit date: 08/11/2016 Discharge date: 08/14/2016  Admitted From: Home Disposition:  Home with [facility] PT  Recommendations for Outpatient Follow-up:  1. Follow up with Dr. Alen Blew in 1 week  Home Health: YES, HHPT Equipment/Devices: home oxygen  Discharge Condition: Stable CODE STATUS: DNR  Brief/Interim Summary: HPI: Chad Wang is a 60 y.o. male with a past medical history significant for NIDDM, COPD, schizophrenia on Clozaril, and laryngeal cancer and lung cancer on chemo and chronic respiratory failure with home O2 5L who presents with dyspnea.  The patient was in his usual health (has chronic dyspnea on exertion from COPD and lung cancer and wears home O2), until tonight, he was woken from sleep short of breath.  He was short of breath with any exertion, accompanied by chest discomfort. This was moderate intensity, "dull" or "tight" but also present sometimes at rest.  He had a new cough productive of whitish sputum, but no purulent sputum, no fever, chills, rigors, hemoptysis, no malaise.    Because this dyspnea was more than his baseline, he came to the ER.  ED course: -Afebrile, heart rate 103, respirations 22, BP 124/83, pulse ox 100% on home O2, 85% on room air -Na 123, K 4.0, Cr 0.38, WBC 4.9K, Hgb 9.2 (down from 11), platelets 68K (down from >200K) -Troponin 0.03, ECG with sinus tachcyardia and LVH no ST changes -BNP normal -CXR showed patchy opacities, and CT angiogram showed no PE or dissection but new progression of his cancer and some multifocal ground glass opacities consistent with pneumonia or carcinomatosis -TRH were asked to evaluate  Hospital course: Chad Wang a 60 y.o.malewith a past medical history significant for NIDDM, COPD, schizophrenia on Clozaril, and laryngeal cancer and lung cancer on chemo and chronic respiratory  failure with home O2 5L who presents with dyspnea.  Assessment & Plan:  1. Acute on Chronic Hypoxic Respiratory Failure likely secondary to Pneumonia and Lung Cancer Presents with acute on chronic dyspnea, worse than baseline. Mild vague chest pain. Overall medium cardiovascular risk with diabetes, smoking, formerly hypertension, hyperlipidemia. -Troponins slightly elevated but flat consistent with demand ischemia.  He was started on treatment for HCAP with IV antibiotics and responded very well to zosyn and vancomycin IV.    He was subsequently switched over to oral augmentin therapy and will need to complete 5 more days of therapy.  He will continue his home oxygen therapy at 4L/min nasal cannula. Followup with oncologist Dr. Alen Blew next week.    2. Hyponatremia: Appears euvolemic. Sodium levels improving 10/16.  3. Thrombocytopenia: Unclear etiology. On carboplatin and clozapine. -Holding all heparins  4. HCAP -Improving clinically, discharge home with 5 more days of augmentin.   5. NIDDM: -Hold metformin for 2 days after contrast -SSI with meals in hospital.  Resume home metformin dose at discharge.  Monitor blood sugars as normal at home.   CBG (last 3)   Recent Labs (last 2 labs)    Recent Labs  08/12/16 2041 08/13/16 0759 08/13/16 1128  GLUCAP 95 81 133*     6. COPD: -Continue Dulera -Albuterol when necessary  7. Malnutrition of moderate degree: -Glucerna shakes  8. Schizophrenia: -Continue clozapine and Depakote  DVT prophylaxis:Lovenox Code Status:DO NOT RESUSCITATE  Disposition Plan:10/17 discharge home with home health PT  Discharge Diagnoses:  Principal Problem:   Elevated troponin Active Problems:   Hyponatremia   DM type 2 (diabetes  mellitus, type 2) (HCC)   Malnutrition of moderate degree   Essential hypertension   Squamous cell lung and laryngeal cancer    COPD (chronic obstructive pulmonary disease) (HCC)    Schizophrenia (HCC)   Chronic respiratory failure with hypoxia (HCC)   Anemia in other chronic diseases classified elsewhere   Thrombocytopenia ([place])   HCAP (healthcare-associated pneumonia)  Discharge Instructions    Medication List    TAKE these medications   albuterol 108 (90 Base) MCG/ACT inhaler Commonly known as:  PROVENTIL HFA;VENTOLIN HFA Inhale 2 puffs into the lungs every 6 (six) hours as needed for wheezing or shortness of breath.   amoxicillin-clavulanate 875-125 MG tablet Commonly known as:  AUGMENTIN Take 1 tablet by mouth every 12 (twelve) hours.   bisacodyl 5 MG EC tablet Commonly known as:  DULCOLAX Take 5 mg by mouth daily as needed for moderate constipation.   cloZAPine 100 MG tablet Commonly known as:  CLOZARIL Take 100 mg by mouth at bedtime.   divalproex 500 MG DR tablet Commonly known as:  DEPAKOTE Take 500-1,000 mg by mouth 2 (two) times daily. Takes '500mg'$  in am and '1000mg'$  in pm   docusate sodium 100 MG capsule Commonly known as:  COLACE Take 1 capsule (100 mg total) by mouth 2 (two) times daily.   ENSURE PLUS Liqd Take 237 mLs by mouth.   gemfibrozil 600 MG tablet Commonly known as:  LOPID Take 600 mg by mouth 2 (two) times daily.   lidocaine-prilocaine cream Commonly known as:  EMLA Apply 1 application topically as needed. Apply to port before chemotherapy.   metFORMIN 1000 MG tablet Commonly known as:  GLUCOPHAGE Take 500 mg by mouth 2 (two) times daily with a meal.   mometasone-formoterol 200-5 MCG/ACT Aero Commonly known as:  DULERA Inhale 2 puffs into the lungs 2 (two) times daily.   prochlorperazine 10 MG tablet Commonly known as:  COMPAZINE Take 1 tablet (10 mg total) by mouth every 6 (six) hours as needed for nausea or vomiting.   RADIAPLEX EX Apply topically. Reported on 04/19/2016   SONAFINE Apply 1 application topically 3 (three) times daily. Reported on 04/19/2016      Follow-up Information    SHADAD,FIRAS, MD.  Schedule an appointment as soon as possible for a visit in 1 week(s).   Specialty:  Oncology Why:  Hospital Follow Up Contact information: [place]. [place] [address] [phone]          No Known Allergies  Procedures/Studies: Dg Chest 2 View  Result Date: 08/12/2016 CLINICAL DATA:  Acute onset of productive cough and shortness of breath. Generalized weakness. Initial encounter. EXAM: CHEST  2 VIEW COMPARISON:  Chest radiograph performed 02/04/2016, and CT of the chest performed 06/21/2016 FINDINGS: The lungs are well-aerated. Vascular congestion is noted. Increased interstitial markings raise concern for pulmonary edema. This is superimposed on the patient's bilateral pulmonary masses, reflecting known metastatic disease. Superimposed pneumonia could have a similar appearance. The heart is normal in size; the mediastinal contour is within normal limits. A right-sided chest port is noted ending about the distal SVC. No acute osseous abnormalities are seen. IMPRESSION: Vascular congestion noted. Increased interstitial markings raise concern for pulmonary edema, superimposed on the patient's known bilateral metastatic disease. Superimposed pneumonia could have a similar appearance. Electronically Signed   By: Garald Balding M.D.   On: 08/12/2016 00:13   Ct Angio Chest Pe W And/or Wo Contrast  Result Date: 08/12/2016 CLINICAL DATA:  60 y/o M; productive  cough, shortness of breath, and chest pain. EXAM: CT ANGIOGRAPHY CHEST WITH CONTRAST TECHNIQUE: Multidetector CT imaging of the chest was performed using the standard protocol during bolus administration of intravenous contrast. Multiplanar CT image reconstructions and MIPs were obtained to evaluate the vascular anatomy. CONTRAST:  100 cc Isovue 370 COMPARISON:  06/21/2016 CT chest. FINDINGS: Cardiovascular: Normal heart size. Small pericardial effusion. Normal caliber thoracic aorta. Minimal aortic atherosclerosis with arch  calcifications. Normal caliber main pulmonary artery. Satisfactory opacification of the pulmonary artery to the segmental level. No pulmonary embolus. Mediastinum/Nodes: Nonspecific right lower and upper peritracheal lymphadenopathy. Lungs/Pleura: Left greater than right perihilar consolidation, left upper lobe consolidation, and diffuse ground-glass opacities is most likely to represent multi focal pneumonia. Additionally, there are multiple discrete pulmonary nodules which are either new or increased in size in comparison with the prior CT of the chest: 1. Right lower lobe, 26 mm, series 7 image 56 2. Right hilum, 31 mm, image 49 3. Right lower lobe, 11 mm, image 50 4. Right lower lobe, 7 mm image 62 5. Right middle lobe, 7 mm image 68 6. Right apex, 16 mm, image 17 4 cm left upper lobe mass is grossly stable. Small bilateral pleural effusions. Background of moderate to severe pulmonary emphysema. Minor interlobular septal thickening within the lung apices. Upper Abdomen: No acute abnormality. Musculoskeletal: No acute osseous abnormality identified. Moderate degenerative changes of the thoracic spine and mild degenerative loss of height of mid thoracic vertebral bodies. Review of the MIP images confirms the above findings. IMPRESSION: 1. Areas of consolidation and ground-glass opacity are suspicious for pneumonia. A component of infiltrate neoplasm and lymphangitic carcinomatosis is also possible. 2. Numerous pulmonary nodules predominantly in the right lung are either new or increased in size from the prior CT of the chest probably representing worsening metastatic disease. Grossly stable left upper lobe mass. 3. Minor interlobular septal thickening is consistent with a component of interstitial edema. 4. Small bilateral pleural effusions. 5. Background of moderate to severe pulmonary emphysema. 6. No evidence for pulmonary embolus. Electronically Signed   By: Kristine Garbe M.D.   On: 08/12/2016 03:22      Subjective: Pt feeling much better today.  Stable to go home.    Discharge Exam: Vitals:   08/13/16 2210 08/14/16 0504  BP: 117/73 (!) 98/56  Pulse: 88 86  Resp: 18 18  Temp: 99.2 F (37.3 C) 98 F (36.7 C)   Vitals:   08/13/16 1300 08/13/16 1919 08/13/16 2210 08/14/16 0504  BP: 105/72  117/73 (!) 98/56  Pulse: 79  88 86  Resp: '18  18 18  '$ Temp: 98.1 F (36.7 C)  99.2 F (37.3 C) 98 F (36.7 C)  TempSrc: Oral  Oral Oral  SpO2: 99% 99% 99% 98%  Weight:      Height:       General exam: Thin adult male, alert and in no acutedistress.  Respiratory system: scattered rhonchi and crackles. No increased work of breathing. Cardiovascular system: S1 & S2 heard, RRR. No JVD, murmurs, gallops, clicks or pedal edema. Gastrointestinal system: Abdomen is nondistended, soft and nontender. Normal bowel sounds heard. Central nervous system: Alert and oriented. No focal neurological deficits. Extremities: no cyanosis.  The results of significant diagnostics from this hospitalization (including imaging, microbiology, ancillary and laboratory) are listed below for reference.    Microbiology: Recent Results (from the past 240 hour(s))  Culture, blood (Routine X 2) w Reflex to ID Panel     Status: None (Preliminary  result)   Collection Time: 08/12/16 12:55 PM  Result Value Ref Range Status   Specimen Description BLOOD RIGHT ANTECUBITAL  Final   Special Requests   Final    BOTTLES DRAWN AEROBIC AND ANAEROBIC 10CC BOTH BOTTLES   Culture   Final    NO GROWTH < 24 HOURS Performed at [facility]    Report Status PENDING  Incomplete  Culture, blood (Routine X 2) w Reflex to ID Panel     Status: None (Preliminary result)   Collection Time: 08/12/16 12:59 PM  Result Value Ref Range Status   Specimen Description BLOOD LEFT HAND  Final   Special Requests IN PEDIATRIC BOTTLE 4CC  Final   Culture   Final    NO GROWTH < 24 HOURS Performed at [facility]    Report Status  PENDING  Incomplete    Labs: BNP (last 3 results)  Recent Labs  01/18/16 1634 01/31/16 2242 08/12/16 0057  BNP 24.5 69.8 28.7   Basic Metabolic Panel:  Recent Labs Lab 08/12/16 0057 08/13/16 0325  NA 123* 132*  K 4.0 4.0  CL 90* 100*  CO2 25 26  GLUCOSE 126* 104*  BUN 7 7  CREATININE 0.38* 0.60*  CALCIUM 8.8* 8.8*   Liver Function Tests: No results for input(s): AST, ALT, ALKPHOS, BILITOT, PROT, ALBUMIN in the last 168 hours. No results for input(s): LIPASE, AMYLASE in the last 168 hours. No results for input(s): AMMONIA in the last 168 hours. CBC:  Recent Labs Lab 08/12/16 0057 08/13/16 0325  WBC 4.9 4.3  NEUTROABS 4.2  --   HGB 9.2* 9.0*  HCT 24.6* 25.3*  MCV 78.1 80.1  PLT 68* 56*   Cardiac Enzymes:  Recent Labs Lab 08/12/16 0057 08/12/16 0930 08/12/16 1301  TROPONINI 0.03* 0.06* 0.04*   BNP: Invalid input(s): POCBNP CBG:  Recent Labs Lab 08/13/16 0759 08/13/16 1128 08/13/16 1657 08/13/16 2205 08/14/16 0806  GLUCAP 81 133* 148* 107* 85   D-Dimer No results for input(s): DDIMER in the last 72 hours. Hgb A1c No results for input(s): HGBA1C in the last 72 hours. Lipid Profile No results for input(s): CHOL, HDL, LDLCALC, TRIG, CHOLHDL, LDLDIRECT in the last 72 hours. Thyroid function studies No results for input(s): TSH, T4TOTAL, T3FREE, THYROIDAB in the last 72 hours.  Invalid input(s): FREET3 Anemia work up No results for input(s): VITAMINB12, FOLATE, FERRITIN, TIBC, IRON, RETICCTPCT in the last 72 hours. Urinalysis    Component Value Date/Time   COLORURINE YELLOW 02/01/2016 0113   APPEARANCEUR CLEAR 02/01/2016 0113   LABSPEC 1.013 02/01/2016 0113   PHURINE 7.0 02/01/2016 0113   GLUCOSEU 100 (A) 02/01/2016 0113   HGBUR NEGATIVE 02/01/2016 0113   BILIRUBINUR NEGATIVE 02/01/2016 0113   KETONESUR 15 (A) 02/01/2016 0113   PROTEINUR NEGATIVE 02/01/2016 0113   UROBILINOGEN 0.2 08/15/2014 1654   NITRITE NEGATIVE 02/01/2016 0113    LEUKOCYTESUR NEGATIVE 02/01/2016 0113   Sepsis Labs Invalid input(s): PROCALCITONIN,  WBC,  LACTICIDVEN Microbiology Recent Results (from the past 240 hour(s))  Culture, blood (Routine X 2) w Reflex to ID Panel     Status: None (Preliminary result)   Collection Time: 08/12/16 12:55 PM  Result Value Ref Range Status   Specimen Description BLOOD RIGHT ANTECUBITAL  Final   Special Requests   Final    BOTTLES DRAWN AEROBIC AND ANAEROBIC 10CC BOTH BOTTLES   Culture   Final    NO GROWTH < 24 HOURS Performed at [facility]  Report Status PENDING  Incomplete  Culture, blood (Routine X 2) w Reflex to ID Panel     Status: None (Preliminary result)   Collection Time: 08/12/16 12:59 PM  Result Value Ref Range Status   Specimen Description BLOOD LEFT HAND  Final   Special Requests IN PEDIATRIC BOTTLE 4CC  Final   Culture   Final    NO GROWTH < 24 HOURS Performed at [facility]    Report Status PENDING  Incomplete   Time coordinating discharge: Over 30 minutes  SIGNED:  Irwin Brakeman, MD  [facility] 08/14/2016, 9:32 AM Pager   If 7PM-7AM, please contact night-coverage [email or web address] Password [email or web address]

## 2016-08-14 NOTE — Discharge Instructions (Signed)
Acute Respiratory Distress Syndrome Acute respiratory distress syndrome is a life-threatening condition in which fluid collects in the lungs. This condition can cause severe shortness of breath and low oxygen levels in the blood. It can also cause the lungs and other vital organs to fail. The condition usually develops within 24 to 48 hours of an infection, illness, surgery, or injury. CAUSES This condition may be caused by:  An infection, such as sepsis or pneumonia.  A serious injury to the head or chest.  A major surgery.  A drug overdose.  Breathing in harmful chemicals or smoke.  Blood transfusions.  A blood clot in the lungs. SYMPTOMS Symptoms of this condition include:  Fever.  Difficulty breathing.  Bluish skin (cyanosis).  A fast or irregular heartbeat.  Low blood pressure (hypotension).  Agitation.  Confusion.  Lack of energy (lethargy).  Sweating. DIAGNOSIS This condition may be diagnosed by using tests to rule out other diseases and conditions that cause similar symptoms. You may have:  A chest X-ray or CT scan.  Blood tests.  A sputum culture. In this test, you will be asked to spit so that a sample of lung fluid can be taken for testing.  Bronchoscopy. During this test a thin, flexible tool is passed into the mouth or nose, down the windpipe, and into the lungs. TREATMENT This condition may be treated with:  Oxygen. A breathing machine (ventilator) is often used to provide oxygen and help with breathing.  Medicine to help you relax (sedative).  Fluids and nutrients given through an IV tube.  Blood pressure medicine.  Steroid medicine to help decrease inflammation in the lungs.  Diuretic medicine to get rid of extra fluid in the body. Additional treatment may be needed, depending on the cause of the condition. It can take up to 12 months to recover from this condition. Some people recover fully, but others may continue to  have:  Weakness.  Shortness of breath.  Memory problems.  Depression. HOME CARE INSTRUCTIONS Until you recover from this condition:  Do not smoke.  Limit alcohol intake to no more than 1 drink per day for nonpregnant women and 2 drinks per day for men. One drink equals 12 oz of beer, 5 oz of wine, or 1 oz of hard liquor.  Ask friends and family to help you if daily activities make you tired. SEEK MEDICAL CARE IF:  You become short of breath with activity or while resting.  You develop a cough that does not go away.  You have a fever. SEEK IMMEDIATE MEDICAL CARE IF:  You have sudden shortness of breath.  You develop chest pain that does not go away.  You develop swelling or pain in one of your legs.  You cough up blood.   This information is not intended to replace advice given to you by your health care provider. Make sure you discuss any questions you have with your health care provider.   Document Released: 10/15/2005 Document Revised: 03/01/2015 Document Reviewed: 10/11/2014 Elsevier Interactive Patient Education 2016 [place] in the Home  Falls can cause injuries. They can happen to people of all ages. There are many things you can do to make your home safe and to help prevent falls.  WHAT CAN I DO ON THE OUTSIDE OF MY HOME?  Regularly fix the edges of walkways and driveways and fix any cracks.  Remove anything that might make you trip as you walk through a door, such as  a raised step or threshold.  Trim any bushes or trees on the path to your home.  Use bright outdoor lighting.  Clear any walking paths of anything that might make someone trip, such as rocks or tools.  Regularly check to see if handrails are loose or broken. Make sure that both sides of any steps have handrails.  Any raised decks and porches should have guardrails on the edges.  Have any leaves, snow, or ice cleared regularly.  Use sand or salt on walking paths  during winter.  Clean up any spills in your garage right away. This includes oil or grease spills. WHAT CAN I DO IN THE BATHROOM?   Use night lights.  Install grab bars by the toilet and in the tub and shower. Do not use towel bars as grab bars.  Use non-skid mats or decals in the tub or shower.  If you need to sit down in the shower, use a plastic, non-slip stool.  Keep the floor dry. Clean up any water that spills on the floor as soon as it happens.  Remove soap buildup in the tub or shower regularly.  Attach bath mats securely with double-sided non-slip rug tape.  Do not have throw rugs and other things on the floor that can make you trip. WHAT CAN I DO IN THE BEDROOM?  Use night lights.  Make sure that you have a light by your bed that is easy to reach.  Do not use any sheets or blankets that are too big for your bed. They should not hang down onto the floor.  Have a firm chair that has side arms. You can use this for support while you get dressed.  Do not have throw rugs and other things on the floor that can make you trip. WHAT CAN I DO IN THE KITCHEN?  Clean up any spills right away.  Avoid walking on wet floors.  Keep items that you use a lot in easy-to-reach places.  If you need to reach something above you, use a strong step stool that has a grab bar.  Keep electrical cords out of the way.  Do not use floor polish or wax that makes floors slippery. If you must use wax, use non-skid floor wax.  Do not have throw rugs and other things on the floor that can make you trip. WHAT CAN I DO WITH MY STAIRS?  Do not leave any items on the stairs.  Make sure that there are handrails on both sides of the stairs and use them. Fix handrails that are broken or loose. Make sure that handrails are as long as the stairways.  Check any carpeting to make sure that it is firmly attached to the stairs. Fix any carpet that is loose or worn.  Avoid having throw rugs at the top  or bottom of the stairs. If you do have throw rugs, attach them to the floor with carpet tape.  Make sure that you have a light switch at the top of the stairs and the bottom of the stairs. If you do not have them, ask someone to add them for you. WHAT ELSE CAN I DO TO HELP PREVENT FALLS?  Wear shoes that:  Do not have high heels.  Have rubber bottoms.  Are comfortable and fit you well.  Are closed at the toe. Do not wear sandals.  If you use a stepladder:  Make sure that it is fully opened. Do not climb a closed stepladder.  Make sure that both sides of the stepladder are locked into place.  Ask someone to hold it for you, if possible.  Clearly mark and make sure that you can see:  Any grab bars or handrails.  First and last steps.  Where the edge of each step is.  Use tools that help you move around (mobility aids) if they are needed. These include:  Canes.  Walkers.  Scooters.  Crutches.  Turn on the lights when you go into a dark area. Replace any light bulbs as soon as they burn out.  Set up your furniture so you have a clear path. Avoid moving your furniture around.  If any of your floors are uneven, fix them.  If there are any pets around you, be aware of where they are.  Review your medicines with your doctor. Some medicines can make you feel dizzy. This can increase your chance of falling. Ask your doctor what other things that you can do to help prevent falls.   This information is not intended to replace advice given to you by your health care provider. Make sure you discuss any questions you have with your health care provider.   Document Released: 08/11/2009 Document Revised: 03/01/2015 Document Reviewed: 11/19/2014 Elsevier Interactive Patient Education 2016 [place].  Oxygen Use at Home Oxygen can be prescribed for home use. The prescription will show the flow rate. This is how much oxygen is to be used per minute. This will be listed in  liters per minute (LPM or L/M). A liter is a metric measurement of volume. You will use oxygen therapy as directed. It can be used while exercising, sleeping, or at rest. You may need oxygen continuously. Your health care provider may order a blood oxygen test (arterial blood gas or pulse oximetry test) that will show what your oxygen level is. Your health care provider will use these measurements to learn about your needs and follow your progress. Home oxygen therapy is commonly used on patients with various lung (pulmonary) related conditions. Some of these conditions include:  Asthma.  Lung cancer.  Pneumonia.  Emphysema.  Chronic bronchitis.  Cystic fibrosis.  Other lung diseases.  Pulmonary fibrosis.  Occupational lung disease.  Heart failure.  Chronic obstructive pulmonary disease (COPD). 3 COMMON WAYS OF PROVIDING OXYGEN THERAPY  Gas: The gas form of oxygen is put into variously sized cylinders or tanks. The cylinders or oxygen tanks contain compressed oxygen. The cylinder is equipped with a regulator that controls the flow rate. Because the flow of oxygen out of the cylinder is constant, an oxygen conserving device may be attached to the system to avoid waste. This device releases the gas only when you inhale and cuts it off when you exhale. Oxygen can be provided in a small cylinder that can be carried with you. Large tanks are heavy and are only for stationary use. After use, empty tanks must be exchanged for full tanks.  Liquid: The liquid form of oxygen is put into a container similar to a thermos. When released, the liquid converts to a gas and you breathe it in just like the compressed gas. This storage method takes up less space than the compressed gas cylinder, and you can transfer the liquid to a small, portable vessel at home. Liquid oxygen is more expensive than the compressed gas, and the vessel vents when not in use. An oxygen conserving device may be built into the  vessel to conserve the oxygen. Liquid oxygen is very  cold, around 297 below zero.  Oxygen concentrator: This medical device filters oxygen from room air and gives almost 100% oxygen to the patient. Oxygen concentrators are powered by electricity. Benefits of this system are:  It does not need to be resupplied.  It is not as costly as liquid oxygen.  Extra tubing permits the user to move around easier. There are several types of small, portable oxygen systems available which can help you remain active and mobile. You must have a cylinder of oxygen as a backup in the event of a power failure. Advise your electric power company that you are on oxygen therapy in order to get priority service when there is a power failure. OXYGEN DELIVERY DEVICES There are 3 common ways to deliver oxygen to your body.  Nasal cannula. This is a 2-pronged device inserted in the nostrils that is connected to tubing carrying the oxygen. The tubing can rest on the ears or be attached to the frame of eyeglasses.  Mask. People who need a high flow of oxygen generally use a mask.  Transtracheal catheter. Transtracheal oxygen therapy requires the insertion of a small, flexible tube (catheter) in the windpipe (trachea). This catheter is held in place by a necklace. Since transtracheal oxygen bypasses the mouth, nose, and throat, a humidifier is absolutely required at flow rates of 1 LPM or greater. OXYGEN USE SAFETY TIPS  Never smoke while using oxygen. Oxygen does not burn or explode, but flammable materials will burn faster in the presence of oxygen.  Keep a Data processing manager close by. Let your fire department know that you have oxygen in your home.  Warn visitors not to smoke near you when you are using oxygen. Put up "no smoking" signs in your home where you most often use the oxygen.  When you go to a restaurant with your portable oxygen source, ask to be seated in the nonsmoking section.  Stay at least 5 feet  away from gas stoves, candles, lighted fireplaces, or other heat sources.  Do not use materials that burn easily (flammable) while using your oxygen.  If you use an oxygen cylinder, make sure it is secured to some fixed object or in a stand. If you use liquid oxygen, make sure the vessel is kept upright to keep the oxygen from pouring out. Liquid oxygen is so cold it can hurt your skin.  If you use an oxygen concentrator, call your electric company so you will be given priority service if your power goes out. Avoid using extension cords, if possible.  Regularly test your smoke detectors at home to make sure they work. If you receive care in your home from a nurse or other health care provider, he or she may also check to make sure your smoke detectors work. GUIDELINES FOR CLEANING YOUR EQUIPMENT  Wash the nasal prongs with a liquid soap. Thoroughly rinse them once or twice a week.  Replace the prongs every 2 to 4 weeks. If you have an infection (cold, pneumonia) change them when you are well.  Your health care provider will give you instructions on how to clean your transtracheal catheter.  The humidifier bottle should be washed with soap and warm water and rinsed thoroughly between each refill. Air-dry the bottle before filling it with sterile or distilled water. The bottle and its top should be disinfected after they are cleaned.  If you use an oxygen concentrator, unplug the unit. Then wipe down the cabinet with a damp cloth and dry it  daily. The air filter should be cleaned at least twice a week.  Follow your home medical equipment and service company's directions for cleaning the compressor filter. HOME CARE INSTRUCTIONS   Do not change the flow of oxygen unless directed by your health care provider.  Do not use alcohol or other sedating drugs unless instructed. They slow your breathing rate.  Do not use materials that burn easily (flammable) while using your oxygen.  Always keep a  spare tank of oxygen. Plan ahead for holidays when you may not be able to get a prescription filled.  Use water-based lubricants on your lips or nostrils. Do not use an oil-based product like petroleum jelly.  To prevent your cheeks or the skin behind your ears from becoming irritated, tuck some gauze under the tubing.  If you have persistent redness under your nose, call your health care provider.  When you no longer need oxygen, your doctor will have the oxygen discontinued. Oxygen is not addicting or habit forming.  Use the oxygen as instructed. Too much oxygen can be harmful and too little will not give you the benefit you need.  Shortness of breath is not always from a lack of oxygen. If your oxygen level is not the cause of your shortness of breath, taking oxygen will not help. SEEK MEDICAL CARE IF:   You have frequent headaches.  You have shortness of breath or a lasting cough.  You have anxiety.  You are confused.  You are drowsy or sleepy all the time.  You develop an illness which aggravates your breathing.  You cannot exercise.  You are restless.  You have blue lips or fingernails.  You have difficult or irregular breathing and it is getting worse.  You have a fever.   This information is not intended to replace advice given to you by your health care provider. Make sure you discuss any questions you have with your health care provider.   Document Released: 01/05/2004 Document Revised: 11/05/2014 Document Reviewed: 05/27/2013 Elsevier Interactive Patient Education [employer].

## 2016-08-17 LAB — CULTURE, BLOOD (ROUTINE X 2)
CULTURE: NO GROWTH
Culture: NO GROWTH

## 2016-08-22 ENCOUNTER — Ambulatory Visit (HOSPITAL_BASED_OUTPATIENT_CLINIC_OR_DEPARTMENT_OTHER): Payer: Medicare Other | Admitting: Oncology

## 2016-08-22 ENCOUNTER — Telehealth: Payer: Self-pay | Admitting: Oncology

## 2016-08-22 ENCOUNTER — Ambulatory Visit (HOSPITAL_BASED_OUTPATIENT_CLINIC_OR_DEPARTMENT_OTHER): Payer: Medicare Other

## 2016-08-22 ENCOUNTER — Other Ambulatory Visit (HOSPITAL_BASED_OUTPATIENT_CLINIC_OR_DEPARTMENT_OTHER): Payer: Medicare Other

## 2016-08-22 VITALS — BP 117/71 | HR 110 | Temp 97.5°F | Resp 20 | Ht 70.0 in | Wt 148.2 lb

## 2016-08-22 DIAGNOSIS — R0609 Other forms of dyspnea: Secondary | ICD-10-CM | POA: Diagnosis not present

## 2016-08-22 DIAGNOSIS — Z5111 Encounter for antineoplastic chemotherapy: Secondary | ICD-10-CM

## 2016-08-22 DIAGNOSIS — C3412 Malignant neoplasm of upper lobe, left bronchus or lung: Secondary | ICD-10-CM

## 2016-08-22 DIAGNOSIS — C329 Malignant neoplasm of larynx, unspecified: Secondary | ICD-10-CM | POA: Diagnosis present

## 2016-08-22 DIAGNOSIS — C3432 Malignant neoplasm of lower lobe, left bronchus or lung: Secondary | ICD-10-CM | POA: Diagnosis not present

## 2016-08-22 DIAGNOSIS — C321 Malignant neoplasm of supraglottis: Secondary | ICD-10-CM

## 2016-08-22 DIAGNOSIS — C3490 Malignant neoplasm of unspecified part of unspecified bronchus or lung: Secondary | ICD-10-CM

## 2016-08-22 LAB — CBC WITH DIFFERENTIAL/PLATELET
BASO%: 1.8 % (ref 0.0–2.0)
Basophils Absolute: 0.1 10*3/uL (ref 0.0–0.1)
EOS%: 0.8 % (ref 0.0–7.0)
Eosinophils Absolute: 0 10*3/uL (ref 0.0–0.5)
HCT: 28.1 % — ABNORMAL LOW (ref 38.4–49.9)
HEMOGLOBIN: 9.5 g/dL — AB (ref 13.0–17.1)
LYMPH%: 10.6 % — ABNORMAL LOW (ref 14.0–49.0)
MCH: 28.8 pg (ref 27.2–33.4)
MCHC: 33.8 g/dL (ref 32.0–36.0)
MCV: 85.2 fL (ref 79.3–98.0)
MONO#: 1.5 10*3/uL — ABNORMAL HIGH (ref 0.1–0.9)
MONO%: 28.9 % — ABNORMAL HIGH (ref 0.0–14.0)
NEUT%: 57.9 % (ref 39.0–75.0)
NEUTROS ABS: 3 10*3/uL (ref 1.5–6.5)
NRBC: 3 % — AB (ref 0–0)
Platelets: 309 10*3/uL (ref 140–400)
RBC: 3.3 10*6/uL — ABNORMAL LOW (ref 4.20–5.82)
RDW: 20.9 % — AB (ref 11.0–14.6)
WBC: 5.1 10*3/uL (ref 4.0–10.3)
lymph#: 0.5 10*3/uL — ABNORMAL LOW (ref 0.9–3.3)

## 2016-08-22 LAB — COMPREHENSIVE METABOLIC PANEL
ALBUMIN: 2.8 g/dL — AB (ref 3.5–5.0)
ALK PHOS: 63 U/L (ref 40–150)
ALT: 13 U/L (ref 0–55)
AST: 21 U/L (ref 5–34)
Anion Gap: 9 mEq/L (ref 3–11)
BILIRUBIN TOTAL: 0.24 mg/dL (ref 0.20–1.20)
BUN: 7.4 mg/dL (ref 7.0–26.0)
CO2: 25 mEq/L (ref 22–29)
Calcium: 9.6 mg/dL (ref 8.4–10.4)
Chloride: 104 mEq/L (ref 98–109)
Creatinine: 0.7 mg/dL (ref 0.7–1.3)
EGFR: >90 mL/min/{1.73_m2} (ref >90)
GLUCOSE: 121 mg/dL (ref 70–140)
POTASSIUM: 4.1 meq/L (ref 3.5–5.1)
SODIUM: 138 meq/L (ref 136–145)
TOTAL PROTEIN: 6.2 g/dL — AB (ref 6.4–8.3)

## 2016-08-22 LAB — TECHNOLOGIST REVIEW

## 2016-08-22 MED ORDER — DEXAMETHASONE SODIUM PHOSPHATE 10 MG/ML IJ SOLN
10.0000 mg | Freq: Once | INTRAMUSCULAR | Status: AC
  Administered 2016-08-22: 10 mg via INTRAVENOUS

## 2016-08-22 MED ORDER — CARBOPLATIN CHEMO INJECTION 600 MG/60ML
600.0000 mg | Freq: Once | INTRAVENOUS | Status: AC
  Administered 2016-08-22: 600 mg via INTRAVENOUS
  Filled 2016-08-22: qty 60

## 2016-08-22 MED ORDER — SODIUM CHLORIDE 0.9 % IV SOLN
Freq: Once | INTRAVENOUS | Status: AC
  Administered 2016-08-22: 11:00:00 via INTRAVENOUS

## 2016-08-22 MED ORDER — DEXAMETHASONE SODIUM PHOSPHATE 10 MG/ML IJ SOLN
INTRAMUSCULAR | Status: AC
  Filled 2016-08-22: qty 1

## 2016-08-22 MED ORDER — GEMCITABINE HCL CHEMO INJECTION 1 GM/26.3ML
1000.0000 mg/m2 | Freq: Once | INTRAVENOUS | Status: AC
  Administered 2016-08-22: 1824 mg via INTRAVENOUS
  Filled 2016-08-22: qty 47.97

## 2016-08-22 MED ORDER — SODIUM CHLORIDE 0.9% FLUSH
10.0000 mL | INTRAVENOUS | Status: DC | PRN
  Administered 2016-08-22: 10 mL
  Filled 2016-08-22: qty 10

## 2016-08-22 MED ORDER — PALONOSETRON HCL INJECTION 0.25 MG/5ML
INTRAVENOUS | Status: AC
  Filled 2016-08-22: qty 5

## 2016-08-22 MED ORDER — PALONOSETRON HCL INJECTION 0.25 MG/5ML
0.2500 mg | Freq: Once | INTRAVENOUS | Status: AC
Start: 2016-08-22 — End: 2016-08-22
  Administered 2016-08-22: 0.25 mg via INTRAVENOUS

## 2016-08-22 MED ORDER — HEPARIN SOD (PORK) LOCK FLUSH 100 UNIT/ML IV SOLN
500.0000 [IU] | Freq: Once | INTRAVENOUS | Status: AC | PRN
  Administered 2016-08-22: 500 [IU]
  Filled 2016-08-22: qty 5

## 2016-08-22 NOTE — Patient Instructions (Signed)
[facility] Discharge Instructions for Patients Receiving Chemotherapy  Today you received the following chemotherapy agents Gemzar, Carboplatin.   To help prevent nausea and vomiting after your treatment, we encourage you to take your nausea medication as prescribed.    If you develop nausea and vomiting that is not controlled by your nausea medication, call the clinic.   BELOW ARE SYMPTOMS THAT SHOULD BE REPORTED IMMEDIATELY:  *FEVER GREATER THAN 100.5 F  *CHILLS WITH OR WITHOUT FEVER  NAUSEA AND VOMITING THAT IS NOT CONTROLLED WITH YOUR NAUSEA MEDICATION  *UNUSUAL SHORTNESS OF BREATH  *UNUSUAL BRUISING OR BLEEDING  TENDERNESS IN MOUTH AND THROAT WITH OR WITHOUT PRESENCE OF ULCERS  *URINARY PROBLEMS  *BOWEL PROBLEMS  UNUSUAL RASH Items with * indicate a potential emergency and should be followed up as soon as possible.  Feel free to call the clinic you have any questions or concerns. The clinic phone number is [phone].  Please show the CHEMO ALERT CARD at check-in to the Emergency Department and triage nurse.   

## 2016-08-22 NOTE — Progress Notes (Signed)
Hematology and Oncology Follow Up Visit  Chad Wang [id number] January 10, 1956 60 y.o. 08/22/2016 10:21 AM GEXBMW,UXLKG, MDNo ref. provider found   Principle Diagnosis: 60 year old gentleman with the following diagnoses:   1. 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 This was diagnosed on 01/18/2016. 2. Squamous cell carcinoma of the lung presented with an endobronchial lesion and left lower lung collapse. In March 2017.   Prior Therapy:  He is status post bronchoscopy and laryngitis daily and biopsy of both these lesions in March 2017. He is status post definitive radiation to the larynx and the left lung completed in May 2017.  Current therapy: Carboplatin and gemcitabine chemotherapy with cycle 1 starting on 07/13/2016. He is ready to proceed with cycle 3.  Interim History:  Chad Wang presents today for a follow-up visit with his sister. Since the last visit, he was hospitalized between 08/11/2016 and 08/14/2016. He was found to have respiratory distress and possible pneumonia. CT scan of the chest showed possible progression of his cancer and possible pneumonia. He was treated with intravenous antibiotics and was discharged on oral antibiotics.   Since his discharge, he continues to have respiratory difficulties including dyspnea on exertion. He denies any cough, wheezing or hemoptysis. He currently uses oxygen which helped his symptoms. His performance status and appetite is declining and lost more weight.   He did tolerate cycle 2 of chemotherapy without any major complications. He did report some mild nausea but no vomiting and occasional dyspepsia. He does report some mild fatigue.   He does not report any headaches, blurry vision, syncope or seizures. He does not report any fevers chills or sweats. Does not report any chest pain, palpitation or orthopnea. Does not report any nausea, vomiting or abdominal pain. Does not  report any frequency urgency or has a. Does not report any skeletal complaints. Remaining review of systems unremarkable.  Medications: I have reviewed the patient's current medications.  Current Outpatient Prescriptions  Medication Sig Dispense Refill  . albuterol (PROVENTIL HFA;VENTOLIN HFA) 108 (90 Base) MCG/ACT inhaler Inhale 2 puffs into the lungs every 6 (six) hours as needed for wheezing or shortness of breath. 1 Inhaler 2  . bisacodyl (DULCOLAX) 5 MG EC tablet Take 5 mg by mouth daily as needed for moderate constipation.    . cloZAPine (CLOZARIL) 100 MG tablet Take 100 mg by mouth at bedtime.     . divalproex (DEPAKOTE) 500 MG DR tablet Take 500-1,000 mg by mouth 2 (two) times daily. Takes '500mg'$  in am and '1000mg'$  in pm    . docusate sodium (COLACE) 100 MG capsule Take 1 capsule (100 mg total) by mouth 2 (two) times daily. 10 capsule 0  . ENSURE PLUS (ENSURE PLUS) LIQD Take 237 mLs by mouth.    Marland Kitchen gemfibrozil (LOPID) 600 MG tablet Take 600 mg by mouth 2 (two) times daily.    Marland Kitchen lidocaine-prilocaine (EMLA) cream Apply 1 application topically as needed. Apply to port before chemotherapy. 30 g 0  . metFORMIN (GLUCOPHAGE) 1000 MG tablet Take 500 mg by mouth 2 (two) times daily with a meal.     . mometasone-formoterol (DULERA) 200-5 MCG/ACT AERO Inhale 2 puffs into the lungs 2 (two) times daily. 1 Inhaler 2  . prochlorperazine (COMPAZINE) 10 MG tablet Take 1 tablet (10 mg total) by mouth every 6 (six) hours as needed for nausea or vomiting. 30 tablet 2  . Wound Cleansers (RADIAPLEX EX) Apply topically. Reported on 04/19/2016    .  Wound Dressings (SONAFINE) Apply 1 application topically 3 (three) times daily. Reported on 04/19/2016     No current facility-administered medications for this visit.      Allergies: No Known Allergies  Past Medical History, Surgical history, Social history, and Family History were reviewed and updated.   Physical Exam: Blood pressure 117/71, pulse (!) 110,  temperature 97.5 F (36.4 C), temperature source Oral, resp. rate 20, height '5\' 10"'$  (1.778 m), weight 148 lb 3.2 oz (67.2 kg), SpO2 100 %. ECOG: 1 General appearance: Chronically ill-appearing gentleman appeared without distress. Head: Normocephalic, without obvious abnormality. No oral ulcers or lesions. Neck: no adenopathy. No masses noted. Lymph nodes: Cervical, supraclavicular, and axillary nodes normal. Heart:regular rate and rhythm, S1, S2 normal, no murmur, click, rub or gallop Lung: Clear bilaterally with expiratory wheezes. No dullness to percussion. Abdomin: soft, non-tender, without masses or organomegaly no shifting dullness or ascites. EXT:no erythema, induration, or nodules   Lab Results: Lab Results  Component Value Date   WBC 5.1 08/22/2016   HGB 9.5 (L) 08/22/2016   HCT 28.1 (L) 08/22/2016   MCV 85.2 08/22/2016   PLT 309 08/22/2016     Chemistry      Component Value Date/Time   NA 132 (L) 08/13/2016 0325   NA 130 (L) 08/01/2016 1130   K 4.0 08/13/2016 0325   K 5.3 (H) 08/01/2016 1130   CL 100 (L) 08/13/2016 0325   CO2 26 08/13/2016 0325   CO2 28 08/01/2016 1130   BUN 7 08/13/2016 0325   BUN 7.3 08/01/2016 1130   CREATININE 0.60 (L) 08/13/2016 0325   CREATININE 0.7 08/01/2016 1130      Component Value Date/Time   CALCIUM 8.8 (L) 08/13/2016 0325   CALCIUM 9.8 08/01/2016 1130   ALKPHOS 71 08/01/2016 1130   AST 17 08/01/2016 1130   ALT 10 08/01/2016 1130   BILITOT <0.30 08/01/2016 1130     Impression and Plan:  EXAM: CT ANGIOGRAPHY CHEST WITH CONTRAST  TECHNIQUE: Multidetector CT imaging of the chest was performed using the standard protocol during bolus administration of intravenous contrast. Multiplanar CT image reconstructions and MIPs were obtained to evaluate the vascular anatomy.  CONTRAST:  100 cc Isovue 370  COMPARISON:  06/21/2016 CT chest.  FINDINGS: Cardiovascular: Normal heart size. Small pericardial effusion. Normal caliber  thoracic aorta. Minimal aortic atherosclerosis with arch calcifications. Normal caliber main pulmonary artery. Satisfactory opacification of the pulmonary artery to the segmental level. No pulmonary embolus.  Mediastinum/Nodes: Nonspecific right lower and upper peritracheal lymphadenopathy.  Lungs/Pleura: Left greater than right perihilar consolidation, left upper lobe consolidation, and diffuse ground-glass opacities is most likely to represent multi focal pneumonia.  Additionally, there are multiple discrete pulmonary nodules which are either new or increased in size in comparison with the prior CT of the chest:  1. Right lower lobe, 26 mm, series 7 image 56 2. Right hilum, 31 mm, image 49 3. Right lower lobe, 11 mm, image 50 4. Right lower lobe, 7 mm image 62 5. Right middle lobe, 7 mm image 68 6. Right apex, 16 mm, image 17 4 cm left upper lobe mass is grossly stable.  Small bilateral pleural effusions. Background of moderate to severe pulmonary emphysema. Minor interlobular septal thickening within the lung apices.  Upper Abdomen: No acute abnormality.  Musculoskeletal: No acute osseous abnormality identified. Moderate degenerative changes of the thoracic spine and mild degenerative loss of height of mid thoracic vertebral bodies.  Review of the MIP images confirms the above  findings.  IMPRESSION: 1. Areas of consolidation and ground-glass opacity are suspicious for pneumonia. A component of infiltrate neoplasm and lymphangitic carcinomatosis is also possible. 2. Numerous pulmonary nodules predominantly in the right lung are either new or increased in size from the prior CT of the chest probably representing worsening metastatic disease. Grossly stable left upper lobe mass. 3. Minor interlobular septal thickening is consistent with a component of interstitial edema. 4. Small bilateral pleural effusions. 5. Background of moderate to severe pulmonary  emphysema. 6. No evidence for pulmonary embolus.    60 year old gentleman with the following issues:  1. Squamous cell carcinoma of the larynx: He presented with hoarseness and a 2.3 cm supraglottic laryngeal mass. He does have significant smoking history which might have contributed to this etiology. His CT scan of the neck did not show any evidence of obvious lymphadenopathy. This was diagnosed in March 2017.  He completed definitive radiation therapy with complete resolution of his tumor. His ENT evaluation did not reveal any tumor recurrence.   2. Squamous cell carcinoma of the lung presenting with endobronchial lesion and left lower lung collapse.   He is status post definitive radiation to the thorax and his PET CT scan obtained on 04/18/2016 showed no residual disease.  His CT scan on 06/21/2016 showed relapse at this time with multiple pulmonary nodules necessitating systemic therapy.His tumor histology is squamous cell carcinoma without PDL expression.  He is currently receiving salvage chemotherapy with carboplatin and gemcitabine and have tolerated it well. After 2 cycles of therapy, he did have a CT scan on 08/12/2016. I personally reviewed the scan and showed possible progression of disease.  The natural course of this disease and options of therapy were reviewed with the patient and his sister. I have recommended continuing the same regimen at this time for 2 more cycles and repeat imaging studies. I feel the CT scan that was done on 08/12/2016 might be too soon to detect any benefit from the current regimen. He continues to show progression of disease and clinical decline, I would recommend hospice.  He understands he is an incurable malignancy but carries a poor prognosis but he still has a reasonable performance status I would like to continue palliative approach with systemic therapy. We will continue with chemotherapy at this time and assess him back again in 3 weeks. I will  repeat imaging studies after cycle 4 therapy.   3. Dyspnea on exertion: Appears to have declined could be related to progression of cancer versus recent pneumonia. He is finishing a course of antibiotics.  4. IV access: Port-A-Cath inserted without complications.  5. Antiemetics: Prescription for Compazine was made available to patient. This will be refilled today.  6. Follow-up: Will be in 3 weeks for cycle 4 of chemotherapy.    OZDGUY,QIHKV, MD 10/25/201710:21 AM

## 2016-08-22 NOTE — Telephone Encounter (Signed)
Appointments scheduled per 10/25 LOS. The patient was given AVS report and print out of calendars with future scheduled appointments.

## 2016-08-23 ENCOUNTER — Telehealth: Payer: Self-pay | Admitting: *Deleted

## 2016-08-23 NOTE — Telephone Encounter (Signed)
Per LOS I have scheduled appts, Per request I called patient with appt. Notified the schedule

## 2016-09-05 ENCOUNTER — Inpatient Hospital Stay (HOSPITAL_COMMUNITY)
Admission: EM | Admit: 2016-09-05 | Discharge: 2016-09-07 | DRG: 180 | Disposition: A | Discharge status: Hospice | Payer: Medicare Other | Attending: Family Medicine | Admitting: Family Medicine

## 2016-09-05 ENCOUNTER — Emergency Department (HOSPITAL_COMMUNITY): Payer: Medicare Other

## 2016-09-05 ENCOUNTER — Encounter (HOSPITAL_COMMUNITY): Payer: Self-pay

## 2016-09-05 DIAGNOSIS — I10 Essential (primary) hypertension: Secondary | ICD-10-CM | POA: Diagnosis present

## 2016-09-05 DIAGNOSIS — Z833 Family history of diabetes mellitus: Secondary | ICD-10-CM

## 2016-09-05 DIAGNOSIS — Z515 Encounter for palliative care: Secondary | ICD-10-CM | POA: Diagnosis present

## 2016-09-05 DIAGNOSIS — D62 Acute posthemorrhagic anemia: Secondary | ICD-10-CM | POA: Diagnosis present

## 2016-09-05 DIAGNOSIS — K5900 Constipation, unspecified: Secondary | ICD-10-CM | POA: Diagnosis present

## 2016-09-05 DIAGNOSIS — Z9221 Personal history of antineoplastic chemotherapy: Secondary | ICD-10-CM | POA: Diagnosis not present

## 2016-09-05 DIAGNOSIS — C3492 Malignant neoplasm of unspecified part of left bronchus or lung: Secondary | ICD-10-CM | POA: Diagnosis present

## 2016-09-05 DIAGNOSIS — C799 Secondary malignant neoplasm of unspecified site: Secondary | ICD-10-CM | POA: Diagnosis present

## 2016-09-05 DIAGNOSIS — Z87891 Personal history of nicotine dependence: Secondary | ICD-10-CM

## 2016-09-05 DIAGNOSIS — Z710 Person encountering health services to consult on behalf of another person: Secondary | ICD-10-CM

## 2016-09-05 DIAGNOSIS — Z8249 Family history of ischemic heart disease and other diseases of the circulatory system: Secondary | ICD-10-CM | POA: Diagnosis not present

## 2016-09-05 DIAGNOSIS — Z801 Family history of malignant neoplasm of trachea, bronchus and lung: Secondary | ICD-10-CM

## 2016-09-05 DIAGNOSIS — R0489 Hemorrhage from other sites in respiratory passages: Secondary | ICD-10-CM | POA: Diagnosis not present

## 2016-09-05 DIAGNOSIS — E119 Type 2 diabetes mellitus without complications: Secondary | ICD-10-CM | POA: Diagnosis present

## 2016-09-05 DIAGNOSIS — K219 Gastro-esophageal reflux disease without esophagitis: Secondary | ICD-10-CM | POA: Diagnosis present

## 2016-09-05 DIAGNOSIS — Z66 Do not resuscitate: Secondary | ICD-10-CM | POA: Diagnosis present

## 2016-09-05 DIAGNOSIS — K625 Hemorrhage of anus and rectum: Secondary | ICD-10-CM | POA: Diagnosis present

## 2016-09-05 DIAGNOSIS — E785 Hyperlipidemia, unspecified: Secondary | ICD-10-CM | POA: Diagnosis present

## 2016-09-05 DIAGNOSIS — J9819 Other pulmonary collapse: Secondary | ICD-10-CM | POA: Diagnosis present

## 2016-09-05 DIAGNOSIS — C329 Malignant neoplasm of larynx, unspecified: Secondary | ICD-10-CM | POA: Diagnosis present

## 2016-09-05 DIAGNOSIS — J9621 Acute and chronic respiratory failure with hypoxia: Secondary | ICD-10-CM | POA: Diagnosis present

## 2016-09-05 DIAGNOSIS — R0602 Shortness of breath: Secondary | ICD-10-CM | POA: Diagnosis not present

## 2016-09-05 DIAGNOSIS — R531 Weakness: Secondary | ICD-10-CM | POA: Diagnosis not present

## 2016-09-05 DIAGNOSIS — E871 Hypo-osmolality and hyponatremia: Secondary | ICD-10-CM | POA: Diagnosis not present

## 2016-09-05 DIAGNOSIS — D500 Iron deficiency anemia secondary to blood loss (chronic): Secondary | ICD-10-CM | POA: Diagnosis present

## 2016-09-05 DIAGNOSIS — R042 Hemoptysis: Secondary | ICD-10-CM | POA: Diagnosis not present

## 2016-09-05 DIAGNOSIS — C3491 Malignant neoplasm of unspecified part of right bronchus or lung: Secondary | ICD-10-CM | POA: Diagnosis present

## 2016-09-05 DIAGNOSIS — Z8701 Personal history of pneumonia (recurrent): Secondary | ICD-10-CM

## 2016-09-05 DIAGNOSIS — Z7189 Other specified counseling: Secondary | ICD-10-CM | POA: Diagnosis not present

## 2016-09-05 DIAGNOSIS — C3490 Malignant neoplasm of unspecified part of unspecified bronchus or lung: Secondary | ICD-10-CM | POA: Diagnosis not present

## 2016-09-05 DIAGNOSIS — F209 Schizophrenia, unspecified: Secondary | ICD-10-CM | POA: Diagnosis present

## 2016-09-05 LAB — CBC
HCT: 23.6 % — ABNORMAL LOW (ref 39.0–52.0)
HEMOGLOBIN: 8.4 g/dL — AB (ref 13.0–17.0)
MCH: 30.5 pg (ref 26.0–34.0)
MCHC: 35.6 g/dL (ref 30.0–36.0)
MCV: 85.8 fL (ref 78.0–100.0)
Platelets: 67 10*3/uL — ABNORMAL LOW (ref 150–400)
RBC: 2.75 MIL/uL — AB (ref 4.22–5.81)
RDW: 22.1 % — ABNORMAL HIGH (ref 11.5–15.5)
WBC: 7.9 10*3/uL (ref 4.0–10.5)

## 2016-09-05 LAB — CBC WITH DIFFERENTIAL/PLATELET
BASOS PCT: 0 %
Basophils Absolute: 0 10*3/uL (ref 0.0–0.1)
EOS PCT: 0 %
Eosinophils Absolute: 0 10*3/uL (ref 0.0–0.7)
HEMATOCRIT: 24.7 % — AB (ref 39.0–52.0)
HEMOGLOBIN: 8.6 g/dL — AB (ref 13.0–17.0)
LYMPHS PCT: 2 %
Lymphs Abs: 0.2 10*3/uL — ABNORMAL LOW (ref 0.7–4.0)
MCH: 30.2 pg (ref 26.0–34.0)
MCHC: 34.8 g/dL (ref 30.0–36.0)
MCV: 86.7 fL (ref 78.0–100.0)
MONO ABS: 0.1 10*3/uL (ref 0.1–1.0)
MONOS PCT: 1 %
NEUTROS PCT: 97 %
Neutro Abs: 8.5 10*3/uL — ABNORMAL HIGH (ref 1.7–7.7)
Platelets: 68 10*3/uL — ABNORMAL LOW (ref 150–400)
RBC: 2.85 MIL/uL — AB (ref 4.22–5.81)
RDW: 22.2 % — ABNORMAL HIGH (ref 11.5–15.5)
WBC: 8.8 10*3/uL (ref 4.0–10.5)

## 2016-09-05 LAB — BLOOD GAS, ARTERIAL
Acid-Base Excess: 1 mmol/L (ref 0.0–2.0)
Bicarbonate: 24.7 mmol/L (ref 20.0–28.0)
Drawn by: 103701
FIO2: 100
O2 SAT: 99.2 %
PCO2 ART: 37.7 mmHg (ref 32.0–48.0)
PO2 ART: 138 mmHg — AB (ref 83.0–108.0)
Patient temperature: 98.6
pH, Arterial: 7.432 (ref 7.350–7.450)

## 2016-09-05 LAB — COMPREHENSIVE METABOLIC PANEL
ALK PHOS: 62 U/L (ref 38–126)
ALT: 13 U/L — ABNORMAL LOW (ref 17–63)
ANION GAP: 8 (ref 5–15)
AST: 22 U/L (ref 15–41)
Albumin: 3.2 g/dL — ABNORMAL LOW (ref 3.5–5.0)
BILIRUBIN TOTAL: 0.5 mg/dL (ref 0.3–1.2)
BUN: 8 mg/dL (ref 6–20)
CALCIUM: 8.9 mg/dL (ref 8.9–10.3)
CO2: 25 mmol/L (ref 22–32)
Chloride: 94 mmol/L — ABNORMAL LOW (ref 101–111)
Creatinine, Ser: 0.47 mg/dL — ABNORMAL LOW (ref 0.61–1.24)
Glucose, Bld: 111 mg/dL — ABNORMAL HIGH (ref 65–99)
Potassium: 4.4 mmol/L (ref 3.5–5.1)
Sodium: 127 mmol/L — ABNORMAL LOW (ref 135–145)
TOTAL PROTEIN: 6.6 g/dL (ref 6.5–8.1)

## 2016-09-05 LAB — LACTIC ACID, PLASMA: LACTIC ACID, VENOUS: 0.6 mmol/L (ref 0.5–1.9)

## 2016-09-05 LAB — TROPONIN I

## 2016-09-05 LAB — TYPE AND SCREEN
ABO/RH(D): O POS
ANTIBODY SCREEN: NEGATIVE

## 2016-09-05 MED ORDER — ALBUTEROL SULFATE (2.5 MG/3ML) 0.083% IN NEBU: 2.5000 mg | INHALATION_SOLUTION | RESPIRATORY_TRACT | Status: DC | PRN

## 2016-09-05 MED ORDER — ALBUTEROL SULFATE (2.5 MG/3ML) 0.083% IN NEBU: 2.5000 mg | INHALATION_SOLUTION | Freq: Four times a day (QID) | RESPIRATORY_TRACT | Status: DC | PRN

## 2016-09-05 MED ORDER — CLOZAPINE 100 MG PO TABS
100.0000 mg | ORAL_TABLET | Freq: Every day | ORAL | Status: DC
  Administered 2016-09-05 – 2016-09-06 (×2): 100 mg via ORAL
  Filled 2016-09-05 (×2): qty 1

## 2016-09-05 MED ORDER — BISACODYL 5 MG PO TBEC: 5.0000 mg | DELAYED_RELEASE_TABLET | Freq: Every day | ORAL | Status: DC | PRN

## 2016-09-05 MED ORDER — METHYLPREDNISOLONE SODIUM SUCC 40 MG IJ SOLR
40.0000 mg | Freq: Four times a day (QID) | INTRAMUSCULAR | Status: DC
  Administered 2016-09-06 – 2016-09-07 (×6): 40 mg via INTRAVENOUS
  Filled 2016-09-05 (×7): qty 1

## 2016-09-05 MED ORDER — POLYETHYLENE GLYCOL 3350 17 G PO PACK: 17.0000 g | PACK | Freq: Every day | ORAL | Status: DC | PRN

## 2016-09-05 MED ORDER — ONDANSETRON HCL 4 MG/2ML IJ SOLN: 4.0000 mg | Freq: Four times a day (QID) | INTRAMUSCULAR | Status: DC | PRN

## 2016-09-05 MED ORDER — TRAMADOL HCL 50 MG PO TABS: 50.0000 mg | ORAL_TABLET | Freq: Four times a day (QID) | ORAL | Status: DC | PRN

## 2016-09-05 MED ORDER — MOMETASONE FURO-FORMOTEROL FUM 200-5 MCG/ACT IN AERO
2.0000 | INHALATION_SPRAY | Freq: Two times a day (BID) | RESPIRATORY_TRACT | Status: DC
  Administered 2016-09-06 – 2016-09-07 (×3): 2 via RESPIRATORY_TRACT
  Filled 2016-09-05: qty 8.8

## 2016-09-05 MED ORDER — SODIUM CHLORIDE 0.9% FLUSH
10.0000 mL | INTRAVENOUS | Status: DC | PRN
  Administered 2016-09-06: 10 mL
  Filled 2016-09-05: qty 40

## 2016-09-05 MED ORDER — PROCHLORPERAZINE MALEATE 10 MG PO TABS
10.0000 mg | ORAL_TABLET | Freq: Four times a day (QID) | ORAL | Status: DC | PRN
  Filled 2016-09-05: qty 1

## 2016-09-05 MED ORDER — IPRATROPIUM-ALBUTEROL 0.5-2.5 (3) MG/3ML IN SOLN
3.0000 mL | Freq: Four times a day (QID) | RESPIRATORY_TRACT | Status: DC
  Administered 2016-09-05 – 2016-09-07 (×5): 3 mL via RESPIRATORY_TRACT
  Filled 2016-09-05 (×6): qty 3

## 2016-09-05 MED ORDER — BISACODYL 10 MG RE SUPP: 10.0000 mg | Freq: Every day | RECTAL | Status: DC | PRN

## 2016-09-05 MED ORDER — ACETAMINOPHEN 650 MG RE SUPP: 650.0000 mg | Freq: Four times a day (QID) | RECTAL | Status: DC | PRN

## 2016-09-05 MED ORDER — DIVALPROEX SODIUM ER 500 MG PO TB24
500.0000 mg | ORAL_TABLET | Freq: Every day | ORAL | Status: DC
  Administered 2016-09-06 – 2016-09-07 (×2): 500 mg via ORAL
  Filled 2016-09-05 (×2): qty 1

## 2016-09-05 MED ORDER — ACETAMINOPHEN 325 MG PO TABS: 650.0000 mg | ORAL_TABLET | Freq: Four times a day (QID) | ORAL | Status: DC | PRN

## 2016-09-05 MED ORDER — METHYLPREDNISOLONE SODIUM SUCC 125 MG IJ SOLR
125.0000 mg | Freq: Once | INTRAMUSCULAR | Status: AC
  Administered 2016-09-05: 125 mg via INTRAVENOUS
  Filled 2016-09-05: qty 2

## 2016-09-05 MED ORDER — ALBUTEROL SULFATE HFA 108 (90 BASE) MCG/ACT IN AERS: 2.0000 | INHALATION_SPRAY | Freq: Four times a day (QID) | RESPIRATORY_TRACT | Status: DC | PRN

## 2016-09-05 MED ORDER — ONDANSETRON HCL 4 MG PO TABS: 4.0000 mg | ORAL_TABLET | Freq: Four times a day (QID) | ORAL | Status: DC | PRN

## 2016-09-05 MED ORDER — DIVALPROEX SODIUM ER 500 MG PO TB24
1000.0000 mg | ORAL_TABLET | Freq: Every day | ORAL | Status: DC
  Administered 2016-09-05 – 2016-09-06 (×2): 1000 mg via ORAL
  Filled 2016-09-05 (×2): qty 2

## 2016-09-05 MED ORDER — ENSURE ENLIVE PO LIQD
237.0000 mL | Freq: Two times a day (BID) | ORAL | Status: DC
  Administered 2016-09-06 (×2): 237 mL via ORAL

## 2016-09-05 MED ORDER — DIVALPROEX SODIUM ER 500 MG PO TB24
500.0000 mg | ORAL_TABLET | Freq: Every day | ORAL | Status: DC
  Administered 2016-09-05: 500 mg via ORAL
  Filled 2016-09-05: qty 1

## 2016-09-05 MED ORDER — SODIUM CHLORIDE 0.9% FLUSH: 10.0000 mL | Freq: Two times a day (BID) | INTRAVENOUS | Status: DC

## 2016-09-05 MED ORDER — DOCUSATE SODIUM 100 MG PO CAPS
100.0000 mg | ORAL_CAPSULE | Freq: Two times a day (BID) | ORAL | Status: DC
  Administered 2016-09-05 – 2016-09-07 (×4): 100 mg via ORAL
  Filled 2016-09-05 (×4): qty 1

## 2016-09-05 MED ORDER — SODIUM CHLORIDE 0.9% FLUSH
3.0000 mL | Freq: Two times a day (BID) | INTRAVENOUS | Status: DC
  Administered 2016-09-06: 3 mL via INTRAVENOUS

## 2016-09-05 MED ORDER — IOPAMIDOL (ISOVUE-370) INJECTION 76%
100.0000 mL | Freq: Once | INTRAVENOUS | Status: AC | PRN
  Administered 2016-09-05: 60 mL via INTRAVENOUS

## 2016-09-05 NOTE — Progress Notes (Addendum)
EDCM spoke to patient and his sisters Shirlean Mylar and Maudie Mercury at bedside.  Patient lives alone at home.  Kim reports patient used to live ain a group home and then after being seen by [facility], was able to get an apartment on his own.  [facility] assists patient for his mental health issues, provides transportation for patient to his psych appointments, male sure he has his medications, lab draws etc.  This agency also comes to patient's home.  Patient's healthcare POA is his sister Maudie Mercury.  Patient's brother Marylyn Ishihara is his payee.  Patient confirms he has home health services with [facility] for PT and OT.  Patient has a walker and a bedside commode at home.  Patient's bathroom is on the second floor.  Patient's sisters report patient was able to complete his ADL's without difficulty up until two days ago.  Patient confirms his pcp is Dr. Alen Blew.  Patient's sisters report they do not live near the patient.  No further EDCM needs at this time.  Patient reports [place] supplies his oxygen at home.

## 2016-09-05 NOTE — ED Provider Notes (Signed)
I spoke with Dr Julien Nordmann, heme-onc, who will involve Dr Alen Blew, the patients primary oncologist for consultation and assitance   Jola Schmidt, MD 09/05/16 [phone]

## 2016-09-05 NOTE — ED Triage Notes (Signed)
Pt c/o chronic SOB, congested cough, and weakness, intermittent hemoptysis x "2-3 weeks," and dark red rectal bleeding x 3-4 episodes x 1 day.  Pain score 6/10.  Sts chest pain w/ coughing.  Last chemo x 2 weeks ago.  Pt reports being diagnosed w/ PNA x 2 weeks ago.  Hx of lung CA.

## 2016-09-05 NOTE — ED Notes (Addendum)
Pt put on Vass at 6L, O2 saturation dropped to 86. Pt placed back on non-rebreather, O2 saturation rose back to 100%

## 2016-09-05 NOTE — H&P (Signed)
History and Physical    Chad Wang ZDG:[id number] DOB: Jan 13, 1956 DOA: 09/05/2016  PCP: [name], MD   Patient coming from: Home  Chief Complaint: Shortness of Breath; Hemopytsis  HPI: Chad Wang is a 60 y.o. 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 Patient wears 4 Liters of [place] continuously and states he progressively gotten Short of Breath and stated that he started coughing/spitting up blood a few days ago. Last Chemotherapy Cycle was 2 weeks ago. Patient states he had a rough night and patient's brother checked on him this AM and he was severely short of breath gasping for air. Patient was brought in to the ED and found to be saturating at 72% on 4 Liters and then 82 on 6 Liters. He was placed on a non-rebreather and had imaging done which showed progressive of the metastatic diseases in the lungs bilaterally as well as increase in size in pulmonary nodules. The CT also read as having worsening multifocal airspace disease B/L favoring drug reaction but Alveolar Hemorrhage could not be excluded given patient's hemoptysis.   ED Course: Worked up with CT Scan, placed on NRB, and admitted. ED Physician consulted Patient's Primary Hematologist/Oncologist Dr. Alen Blew.   Review of Systems: As per HPI otherwise 10 point review of systems negative. Denied and fevers chills and states he has had poor appetite and poor sleep.    Past Medical History:  Diagnosis Date  . Cancer ([place]) 2017   lung / larynx  . Diabetes mellitus without complication ([place])   . GERD (gastroesophageal reflux disease)   . Hyperlipemia   . Hypertension   . Schizophrenia [facility])     Past Surgical History:  Procedure Laterality Date  . IR  GENERIC HISTORICAL  07/13/2016   IR FLUORO GUIDE PORT INSERTION RIGHT 07/13/2016 Greggory Keen, MD [facility]-INTERV RAD  . IR GENERIC HISTORICAL  07/13/2016   IR US GUIDE VASC ACCESS RIGHT 07/13/2016 Greggory Keen, MD [facility]  . KNEE ARTHROSCOPY    . MICROLARYNGOSCOPY WITH CO2 LASER AND EXCISION OF VOCAL CORD LESION N/A 01/25/2016   Procedure: MICROLARYNGOSCOPY WITH CO2 LASER, DEBULKING OF LARYNX MASS ;  Surgeon: Melida Quitter, MD;  Location: [place];  Service: ENT;  Laterality: N/A;  . VIDEO BRONCHOSCOPY Bilateral 01/20/2016   Procedure: VIDEO BRONCHOSCOPY WITHOUT FLUORO;  Surgeon: Rigoberto Noel, MD;  Location: [place];  Service: Cardiopulmonary;  Laterality: Bilateral;   SOCIAL HX  reports that he quit smoking about 8 months ago. His smoking use included Cigarettes. He has never used smokeless tobacco. He reports that he drinks alcohol. He reports that he does not use drugs.  No Known Allergies  Family History  Problem Relation Age of Onset  . CAD Mother   . Lung cancer Father   . CAD Brother   . Diabetes Other   . Stroke Neg Hx     Prior to Admission medications   Medication Sig Start Date End Date Taking? Authorizing Provider  albuterol (PROVENTIL HFA;VENTOLIN HFA) 108 (90 Base) MCG/ACT inhaler Inhale 2 puffs into the lungs every 6 (six) hours as needed for wheezing or shortness of breath. 01/28/16  Yes Reyne Dumas, MD  bisacodyl (DULCOLAX) 5 MG EC tablet Take 5 mg by mouth daily as needed for moderate constipation.   Yes Historical  Provider, MD  cloZAPine (CLOZARIL) 100 MG tablet Take 100 mg by mouth at bedtime.    Yes Historical Provider, MD  divalproex (DEPAKOTE ER) 500 MG 24 hr tablet Take 500-1,000 mg by mouth daily. 500 mg in the am and 1000 mg at bedtime   Yes Historical Provider, MD  docusate sodium (COLACE) 100 MG capsule Take 1 capsule (100 mg total) by mouth 2 (two) times daily. 01/28/16  Yes Reyne Dumas, MD  ENSURE PLUS (ENSURE PLUS) LIQD Take 237 mLs by mouth 3 (three) times  daily between meals.    Yes Historical Provider, MD  gemfibrozil (LOPID) 600 MG tablet Take 600 mg by mouth 2 (two) times daily.   Yes Historical Provider, MD  lidocaine-prilocaine (EMLA) cream Apply 1 application topically as needed. Apply to port before chemotherapy. 06/29/16  Yes Wyatt Portela, MD  metFORMIN (GLUCOPHAGE) 500 MG tablet Take 500 mg by mouth 2 (two) times daily with a meal.   Yes Historical Provider, MD  mometasone-formoterol (DULERA) 200-5 MCG/ACT AERO Inhale 2 puffs into the lungs 2 (two) times daily. 08/01/16  Yes Wyatt Portela, MD  prochlorperazine (COMPAZINE) 10 MG tablet Take 1 tablet (10 mg total) by mouth every 6 (six) hours as needed for nausea or vomiting. 08/01/16  Yes Wyatt Portela, MD  Wound Cleansers (RADIAPLEX EX) Apply 1 application topically daily as needed. Reported on 04/19/2016   Yes Historical Provider, MD  Wound Dressings (SONAFINE) Apply 1 application topically 3 (three) times daily. Reported on 04/19/2016 02/24/16  Yes Tyler Pita, MD  metFORMIN (GLUCOPHAGE) 1000 MG tablet Take 500 mg by mouth 2 (two) times daily with a meal.     Historical Provider, MD   Physical Exam: Vitals:   09/05/16 1545 09/05/16 1600 09/05/16 1615 09/05/16 1632  BP: 132/87 113/79 134/84 118/77  Pulse: 111 111 111 111  Resp:    21  Temp:      TempSrc:      SpO2: 100% 96% 97% 100%  Weight:      Height:       Constitutional: Thin ill appearing 60 yo Caucasian male in mild Respiratory Distress Eyes:  Llids and conjunctivae normal, sclerae anicteric  ENMT: External Ears, Nose appear normal. Grossly normal hearing.  Neck: Appears normal, supple, no cervical masses, normal ROM, no appreciable thyromegaly Respiratory: Diminished bilaterally, no wheezing, rales, rhonchi or crackles. Increased work of breathing and increased respirations. No accessory muscle use.  Cardiovascular: Tachycardic Rhythmn, no murmurs / rubs / gallops. S1 and S2 auscultated. No LE extremity edema. 2+ pedal  pulses.  Abdomen: Soft, non-tender, non-distended. No masses palpated. No appreciable hepatosplenomegaly. Bowel sounds positive.  GU: Deferred. Musculoskeletal: No clubbing / cyanosis of digits/nails. No joint deformity upper and lower extremities. .  Skin: No rashes, lesions, ulcers. No induration; Warm and dry.  Neurologic: CN 2-12 grossly intact with no focal deficits. Sensation intact in all 4 Extremities. Romberg sign cerebellar reflexes not assessed.  Psychiatric: Normal judgment and insight. Alert and oriented x 3. Depressed mood and Flat affect.   Labs on Admission: I have personally reviewed following labs and imaging studies  CBC:  Recent Labs Lab 09/05/16 1219  WBC 7.9  HGB 8.4*  HCT 23.6*  MCV 85.8  PLT 67*   Basic Metabolic Panel:  Recent Labs Lab 09/05/16 1219  NA 127*  K 4.4  CL 94*  CO2 25  GLUCOSE 111*  BUN 8  CREATININE 0.47*  CALCIUM 8.9   GFR: Estimated Creatinine Clearance:  91 mL/min (by C-G formula based on SCr of 0.47 mg/dL (L)). Liver Function Tests:  Recent Labs Lab 09/05/16 1219  AST 22  ALT 13*  ALKPHOS 62  BILITOT 0.5  PROT 6.6  ALBUMIN 3.2*   No results for input(s): LIPASE, AMYLASE in the last 168 hours. No results for input(s): AMMONIA in the last 168 hours. Coagulation Profile: No results for input(s): INR, PROTIME in the last 168 hours. Cardiac Enzymes:  Recent Labs Lab 09/05/16 1219  TROPONINI <0.03   BNP (last 3 results) No results for input(s): PROBNP in the last 8760 hours. HbA1C: No results for input(s): HGBA1C in the last 72 hours. CBG: No results for input(s): GLUCAP in the last 168 hours. Lipid Profile: No results for input(s): CHOL, HDL, LDLCALC, TRIG, CHOLHDL, LDLDIRECT in the last 72 hours. Thyroid Function Tests: No results for input(s): TSH, T4TOTAL, FREET4, T3FREE, THYROIDAB in the last 72 hours. Anemia Panel: No results for input(s): VITAMINB12, FOLATE, FERRITIN, TIBC, IRON, RETICCTPCT in the last  72 hours. Urine analysis:    Component Value Date/Time   COLORURINE YELLOW 02/01/2016 0113   APPEARANCEUR CLEAR 02/01/2016 0113   LABSPEC 1.013 02/01/2016 0113   PHURINE 7.0 02/01/2016 0113   GLUCOSEU 100 (A) 02/01/2016 0113   HGBUR NEGATIVE 02/01/2016 0113   BILIRUBINUR NEGATIVE 02/01/2016 0113   KETONESUR 15 (A) 02/01/2016 0113   PROTEINUR NEGATIVE 02/01/2016 0113   UROBILINOGEN 0.2 08/15/2014 1654   NITRITE NEGATIVE 02/01/2016 0113   LEUKOCYTESUR NEGATIVE 02/01/2016 0113   Radiological Exams on Admission: Ct Angio Chest Pe W And/or Wo Contrast  Result Date: 09/05/2016 CLINICAL DATA:  60 year old male with history of chronic shortness of breath, cough and congestion presenting with weakness and intermittent hemoptysis 2-3 times per week. Chest pain while coughing. History of lung cancer, with last chemotherapy 2 weeks ago. EXAM: CT ANGIOGRAPHY CHEST WITH CONTRAST TECHNIQUE: Multidetector CT imaging of the chest was performed using the standard protocol during bolus administration of intravenous contrast. Multiplanar CT image reconstructions and MIPs were obtained to evaluate the vascular anatomy. CONTRAST:  100 mL of Isovue 370. COMPARISON:  Chest CT 08/12/2016. FINDINGS: Cardiovascular: There are no filling defects within the pulmonary atoll tree to suggest underlying pulmonary embolism. Heart size is normal. There is no significant pericardial fluid, thickening or pericardial calcification. There is aortic atherosclerosis, as well as atherosclerosis of the great vessels of the mediastinum and the coronary arteries, including calcified atherosclerotic plaque in the left anterior descending and left circumflex coronary arteries. Right internal jugular single-lumen porta cath with tip terminating in the right atrium. Mediastinum/Nodes: Multiple borderline enlarged and mildly enlarged mediastinal and hilar lymph nodes are noted, measuring up to 1.2 cm in the right hilum. Esophagus is unremarkable  in appearance. No axillary lymphadenopathy. Lungs/Pleura: Treated left upper lobe mass is again noted, measuring 4.3 x 2.6 cm on today's study (image 27 of series 11), very similar to the prior examination. Multiple additional pulmonary nodules and masses are again noted, largest of which is in the central aspect of the right lung abutting the hilum involving either the posterior aspect of the right middle lobe and/or the anterior aspect of the right lower lobe (in close association with the major fissure), measuring 3.1 x 2.6 cm (image 48 of series 4). Additional right lower lobe nodules measure 1.8 x 2.7 cm (image 58 of series 4) and 1.5 x 1.1 cm (image 53 of series 4), slightly increased compared to the prior study. Enlarging right upper lobe pulmonary nodule  near the apex also noted measuring 1.9 x 1.4 cm (image 19 of series 4). Relatively diffuse nodular thickening of the pleural surfaces in the thorax bilaterally. Small bilateral pleural effusions (left greater than right), layering dependently on the left, and partially loculated in the medial aspect of the posterior right hemithorax. Worsening patchy areas of ground-glass attenuation and septal thickening throughout the lungs bilaterally, favored to reflect drug reaction, although a component of alveolar hemorrhage is also possible given the patient's history of recent hemoptysis. Diffuse bronchial wall thickening with moderate centrilobular emphysema. Upper Abdomen: Small calcified granuloma in the right lobe of the liver incidentally noted. Musculoskeletal: There are no aggressive appearing lytic or blastic lesions noted in the visualized portions of the skeleton. Review of the MIP images confirms the above findings. IMPRESSION: 1. No evidence of pulmonary embolism. 2. Progression of metastatic disease in the lungs bilaterally, as above, with interval increase in size of numerous right-sided pulmonary nodules, persistent widespread nodular pleural  thickening, and increasing right hilar lymphadenopathy. 3. Worsening multifocal airspace disease in the lungs bilaterally, favored to predominantly reflect drug reaction, although a component of alveolar hemorrhage is also possible given the patient's recent history of hemoptysis. 4. Diffuse bronchial wall thickening with moderate centrilobular emphysema; imaging findings suggestive of underlying COPD. 5. Aortic atherosclerosis, in addition to three-vessel coronary artery disease. Please note that although the presence of coronary artery calcium documents the presence of coronary artery disease, the severity of this disease and any potential stenosis cannot be assessed on this non-gated CT examination. Assessment for potential risk factor modification, dietary therapy or pharmacologic therapy may be warranted, if clinically indicated. Electronically Signed   By: Vinnie Langton M.D.   On: 09/05/2016 14:47   Dg Chest Portable 1 View  Result Date: 09/05/2016 CLINICAL DATA:  Chronic shortness of breath with cough, congestion and weakness. Intermittent hemoptysis for 2 or 3 weeks. History of lung cancer and pneumonia. EXAM: PORTABLE CHEST 1 VIEW COMPARISON:  Radiographs 08/11/2016 and 02/04/2016.  CT 08/12/2016) FINDINGS: 1128 hours. Right IJ Port-A-Cath tip appears unchanged near the SVC right atrial junction. The heart size and mediastinal contours are stable with mild superior mediastinal widening and bilateral hilar distortion. There is grossly stable lung disease with architectural distortion, subpleural reticulation and honeycomb formation compared with recent studies. Previously demonstrated bilateral pulmonary nodularity is grossly stable, not well seen radiographically. No superimposed airspace disease or significant pleural effusion identified. The bones appear unchanged. IMPRESSION: Radiographically stable appearance of the chest compared with recent studies. There is underlying chronic lung disease with  superimposed interstitial opacities which may be treatment related or inflammatory. Electronically Signed   By: Richardean Sale M.D.   On: 09/05/2016 11:48    EKG: Independently reviewed. Showed Sinus Tachycardia at a rate of 120 with no evidence of ST Elevation or ST Depression on my read.   Assessment/Plan Active Problems:   Pulmonary alveolar hemorrhage  Acute on Chronic Hypoxic Respiratory Failure requiring Non Rebreather likely from Metastatic Cancer with concern for Alveolar Hemorrhage -Palliative Care and Hematology/Oncology  -Continue with NRB and maintain O2 Sats >92% -DuoNeb Breathing Tx and IV Solumedrol; C/w Dulera -Palliative Care Consultation in Place   Hemopytsis likely from Alveolar Hemorrhage -Possible Chemo Side-effect; Hematology/Oncology Notified by Dr. Annia Belt in ER -Continue to Monitor H/H; Hb/Hct decreased since last admission -CBC in AM -Possible Pulmonary Consultation  Squamous Cell Cancer of the Lung Now metastatisizing and Squamous Cell Cancer of the Larynx -Will obtain Hematology/Oncology Consultation -Palliative Care Consultation  Diabetes Mellitus Type 2 -Contiue to Monitor CBG's  Schizophrenia -C/w Clozapine and with Depakote ER Home Doses  Hyponatremia -Possibly Chemo-induced -Continue to Monitor; May need IVF Rehydration  Constipation -C/w Docusate, Miralax, and Bisacodyl   Rectal Bleeding -Likely from Straining. Family opposed to GI Consultation -Continue to Monitor -FOBT pending  DVT prophylaxis: SCD's Code Status: DNR Family Communication: Discussed with Family at bedside and with HCPOA patient's sister at bedside. Disposition Plan: [place] Hospitalization Consults called: Hematology/Oncology Dr. Alen Blew and Palliative Care Consulted Admission status: Inpatient Telemetry  [facility], D.O. [facility] Pager [phone]  If 7PM-7AM, please contact night-coverage [email or web address] Password [email or web address]  09/05/2016, 5:20 PM

## 2016-09-05 NOTE — ED Provider Notes (Signed)
[place] DEPT Provider Note   CSN: [id number] Arrival date & time: 09/05/16  1007     History   Chief Complaint Chief Complaint  Patient presents with  . CA Pt  . Shortness of Breath  . Hemoptysis  . Rectal Bleeding    HPI Chad Wang is a 60 y.o. male.  HPI Patient has a history of metastatic lung cancer currently on 4 L nasal cannula at baseline.  He was brought to the emergency department today after his found to be increasingly weak over the past several days with ongoing hemoptysis for the past 3 days and some dark red rectal bleeding noted.  He states that pain in his chest worsening on the right with associated cough.  His oxygen saturations were 71% on 4 L nasal cannula.  Is currently being treated with chemotherapy.  His last CT of his chest demonstrate a worsening metastatic disease.  He was treated for pneumonia several weeks ago and was hospitalized at that time.  He lives at home by himself.  He been so weak over the past 24 hours he is unable to get up off the couch.  His brother found him today with tissues filled with blood all over the floor around the couch.    Past Medical History:  Diagnosis Date  . Cancer ([place]) 2017   lung / larynx  . Diabetes mellitus without complication ([place])   . GERD (gastroesophageal reflux disease)   . Hyperlipemia   . Hypertension   . Schizophrenia [facility])     Patient Active Problem List   Diagnosis Date Noted  . Acute on chronic respiratory failure with hypoxia ([place])   . Difficulty in walking, not elsewhere classified   . Malignant neoplasm of lung ([place])   . Chronic respiratory failure with hypoxia ([place]) 08/12/2016  . Anemia in other chronic diseases classified elsewhere 08/12/2016  . Thrombocytopenia ([place]) 08/12/2016  . Elevated troponin 08/12/2016  . HCAP (healthcare-associated pneumonia) 08/12/2016  . Palliative care encounter   . DNR (do not resuscitate) discussion   . 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 COPD (chronic obstructive pulmonary disease) ([place]) 02/07/2016  . Schizophrenia ([place]) 02/07/2016  . Pressure ulcer 02/01/2016  . Acute respiratory failure with hypoxemia ([place])   . Back pain   . Laryngeal mass   . Malnutrition of moderate degree 01/19/2016  . Primary cancer of left upper lobe of lung ([place]) 01/19/2016  . Essential hypertension 01/19/2016  . Pulmonary emphysema ([place])   . Community acquired pneumonia 01/18/2016  . Hyponatremia 01/18/2016  . DM type 2 (diabetes mellitus, type 2) ([place]) 01/18/2016  . Tobacco abuse 01/18/2016    Past Surgical History:  Procedure Laterality Date  . IR GENERIC HISTORICAL  07/13/2016   IR FLUORO GUIDE PORT INSERTION RIGHT 07/13/2016 Greggory Keen, MD [facility]-INTERV RAD  . IR GENERIC HISTORICAL  07/13/2016   IR US GUIDE VASC ACCESS RIGHT 07/13/2016 Greggory Keen, MD [facility]INTER[facility]  . KNEE ARTHROSCOPY    . MICROLARYNGOSCOPY WITH CO2 LASER AND EXCISION OF VOCAL CORD LESION N/A 01/25/2016   Procedure: MICROLARYNGOSCOPY WITH CO2 LASER, DEBULKING OF LARYNX MASS ;  Surgeon: Melida Quitter, MD;  Location: [place];  Service: ENT;  Laterality: N/A;  . VIDEO BRONCHOSCOPY Bilateral 01/20/2016   Procedure: VIDEO BRONCHOSCOPY WITHOUT FLUORO;  Surgeon: Rigoberto Noel, MD;  Location: [place];  Service: Cardiopulmonary;  Laterality: Bilateral;  Home Medications    Prior to Admission medications   Medication Sig Start Date End Date Taking? Authorizing Provider  albuterol (PROVENTIL HFA;VENTOLIN HFA) 108 (90 Base) MCG/ACT inhaler Inhale 2 puffs into the lungs every 6 (six) hours as needed for wheezing or shortness of breath. 01/28/16  Yes Reyne Dumas, MD  bisacodyl (DULCOLAX) 5 MG EC tablet Take 5 mg by mouth daily as needed for moderate constipation.   Yes Historical Provider, MD  cloZAPine (CLOZARIL) 100 MG tablet Take 100 mg by mouth at bedtime.    Yes Historical Provider, MD  divalproex (DEPAKOTE ER)  500 MG 24 hr tablet Take 500-1,000 mg by mouth daily. 500 mg in the am and 1000 mg at bedtime   Yes Historical Provider, MD  docusate sodium (COLACE) 100 MG capsule Take 1 capsule (100 mg total) by mouth 2 (two) times daily. 01/28/16  Yes Reyne Dumas, MD  ENSURE PLUS (ENSURE PLUS) LIQD Take 237 mLs by mouth 3 (three) times daily between meals.    Yes Historical Provider, MD  gemfibrozil (LOPID) 600 MG tablet Take 600 mg by mouth 2 (two) times daily.   Yes Historical Provider, MD  lidocaine-prilocaine (EMLA) cream Apply 1 application topically as needed. Apply to port before chemotherapy. 06/29/16  Yes Wyatt Portela, MD  metFORMIN (GLUCOPHAGE) 500 MG tablet Take 500 mg by mouth 2 (two) times daily with a meal.   Yes Historical Provider, MD  mometasone-formoterol (DULERA) 200-5 MCG/ACT AERO Inhale 2 puffs into the lungs 2 (two) times daily. 08/01/16  Yes Wyatt Portela, MD  prochlorperazine (COMPAZINE) 10 MG tablet Take 1 tablet (10 mg total) by mouth every 6 (six) hours as needed for nausea or vomiting. 08/01/16  Yes Wyatt Portela, MD  Wound Cleansers (RADIAPLEX EX) Apply 1 application topically daily as needed. Reported on 04/19/2016   Yes Historical Provider, MD  Wound Dressings (SONAFINE) Apply 1 application topically 3 (three) times daily. Reported on 04/19/2016 02/24/16  Yes Tyler Pita, MD  metFORMIN (GLUCOPHAGE) 1000 MG tablet Take 500 mg by mouth 2 (two) times daily with a meal.     Historical Provider, MD    Family History Family History  Problem Relation Age of Onset  . CAD Mother   . Lung cancer Father   . CAD Brother   . Diabetes Other   . Stroke Neg Hx     Social History Social History  Substance Use Topics  . Smoking status: Former Smoker    Types: Cigarettes    Quit date: 12/28/2015  . Smokeless tobacco: Never Used  . Alcohol use Yes     Allergies   Patient has no known allergies.   Review of Systems Review of Systems  All other systems reviewed and are  negative.    Physical Exam Updated Vital Signs BP 124/82   Pulse 99   Temp (!) 96.5 F (35.8 C) (Axillary)   Resp 18   Ht '5\' 10"'$  (1.778 m)   Wt 148 lb 3.2 oz (67.2 kg)   SpO2 100%   BMI 21.26 kg/m   Physical Exam  Constitutional: He is oriented to person, place, and time. He appears well-developed and well-nourished.  HENT:  Head: Normocephalic and atraumatic.  Eyes: EOM are normal.  Pale conjunctiva.  Neck: Normal range of motion.  Cardiovascular: Regular rhythm and normal heart sounds.   Tachycardia  Pulmonary/Chest:  Right-sided rhonchi.  Increased work of breathing.  Tachypnea.  Abdominal: Soft. He exhibits no distension. There is no  tenderness.  Musculoskeletal: Normal range of motion.  Neurological: He is alert and oriented to person, place, and time.  Skin: Skin is warm and dry.  Psychiatric: He has a normal mood and affect. Judgment normal.  Nursing note and vitals reviewed.    ED Treatments / Results  Labs (all labs ordered are listed, but only abnormal results are displayed) Labs Reviewed  COMPREHENSIVE METABOLIC PANEL - Abnormal; Notable for the following:       Result Value   Sodium 127 (*)    Chloride 94 (*)    Glucose, Bld 111 (*)    Creatinine, Ser 0.47 (*)    Albumin 3.2 (*)    ALT 13 (*)    All other components within normal limits  CBC - Abnormal; Notable for the following:    RBC 2.75 (*)    Hemoglobin 8.4 (*)    HCT 23.6 (*)    RDW 22.1 (*)    Platelets 67 (*)    All other components within normal limits  BLOOD GAS, ARTERIAL - Abnormal; Notable for the following:    pO2, Arterial 138 (*)    All other components within normal limits  CULTURE, BLOOD (ROUTINE X 2)  CULTURE, BLOOD (ROUTINE X 2)  TROPONIN I  LACTIC ACID, PLASMA  POC OCCULT BLOOD, ED  TYPE AND SCREEN  ABO/RH    EKG  EKG Interpretation  Date/Time:  Wednesday September 05 2016 10:17:01 EST Ventricular Rate:  120 PR Interval:    QRS Duration: 92 QT  Interval:  289 QTC Calculation: 409 R Axis:   58 Text Interpretation:  Sinus tachycardia Borderline repolarization abnormality No significant change was found Confirmed by Baneza Bartoszek  MD, Lennette Bihari ([id number]) on 09/05/2016 10:20:41 AM       Radiology Ct Angio Chest Pe W And/or Wo Contrast  Result Date: 09/05/2016 CLINICAL DATA:  60 year old male with history of chronic shortness of breath, cough and congestion presenting with weakness and intermittent hemoptysis 2-3 times per week. Chest pain while coughing. History of lung cancer, with last chemotherapy 2 weeks ago. EXAM: CT ANGIOGRAPHY CHEST WITH CONTRAST TECHNIQUE: Multidetector CT imaging of the chest was performed using the standard protocol during bolus administration of intravenous contrast. Multiplanar CT image reconstructions and MIPs were obtained to evaluate the vascular anatomy. CONTRAST:  100 mL of Isovue 370. COMPARISON:  Chest CT 08/12/2016. FINDINGS: Cardiovascular: There are no filling defects within the pulmonary atoll tree to suggest underlying pulmonary embolism. Heart size is normal. There is no significant pericardial fluid, thickening or pericardial calcification. There is aortic atherosclerosis, as well as atherosclerosis of the great vessels of the mediastinum and the coronary arteries, including calcified atherosclerotic plaque in the left anterior descending and left circumflex coronary arteries. Right internal jugular single-lumen porta cath with tip terminating in the right atrium. Mediastinum/Nodes: Multiple borderline enlarged and mildly enlarged mediastinal and hilar lymph nodes are noted, measuring up to 1.2 cm in the right hilum. Esophagus is unremarkable in appearance. No axillary lymphadenopathy. Lungs/Pleura: Treated left upper lobe mass is again noted, measuring 4.3 x 2.6 cm on today's study (image 27 of series 11), very similar to the prior examination. Multiple additional pulmonary nodules and masses are again noted, largest of  which is in the central aspect of the right lung abutting the hilum involving either the posterior aspect of the right middle lobe and/or the anterior aspect of the right lower lobe (in close association with the major fissure), measuring 3.1 x 2.6 cm (image 48 of  series 4). Additional right lower lobe nodules measure 1.8 x 2.7 cm (image 58 of series 4) and 1.5 x 1.1 cm (image 53 of series 4), slightly increased compared to the prior study. Enlarging right upper lobe pulmonary nodule near the apex also noted measuring 1.9 x 1.4 cm (image 19 of series 4). Relatively diffuse nodular thickening of the pleural surfaces in the thorax bilaterally. Small bilateral pleural effusions (left greater than right), layering dependently on the left, and partially loculated in the medial aspect of the posterior right hemithorax. Worsening patchy areas of ground-glass attenuation and septal thickening throughout the lungs bilaterally, favored to reflect drug reaction, although a component of alveolar hemorrhage is also possible given the patient's history of recent hemoptysis. Diffuse bronchial wall thickening with moderate centrilobular emphysema. Upper Abdomen: Small calcified granuloma in the right lobe of the liver incidentally noted. Musculoskeletal: There are no aggressive appearing lytic or blastic lesions noted in the visualized portions of the skeleton. Review of the MIP images confirms the above findings. IMPRESSION: 1. No evidence of pulmonary embolism. 2. Progression of metastatic disease in the lungs bilaterally, as above, with interval increase in size of numerous right-sided pulmonary nodules, persistent widespread nodular pleural thickening, and increasing right hilar lymphadenopathy. 3. Worsening multifocal airspace disease in the lungs bilaterally, favored to predominantly reflect drug reaction, although a component of alveolar hemorrhage is also possible given the patient's recent history of hemoptysis. 4. Diffuse  bronchial wall thickening with moderate centrilobular emphysema; imaging findings suggestive of underlying COPD. 5. Aortic atherosclerosis, in addition to three-vessel coronary artery disease. Please note that although the presence of coronary artery calcium documents the presence of coronary artery disease, the severity of this disease and any potential stenosis cannot be assessed on this non-gated CT examination. Assessment for potential risk factor modification, dietary therapy or pharmacologic therapy may be warranted, if clinically indicated. Electronically Signed   By: Vinnie Langton M.D.   On: 09/05/2016 14:47   Dg Chest Portable 1 View  Result Date: 09/05/2016 CLINICAL DATA:  Chronic shortness of breath with cough, congestion and weakness. Intermittent hemoptysis for 2 or 3 weeks. History of lung cancer and pneumonia. EXAM: PORTABLE CHEST 1 VIEW COMPARISON:  Radiographs 08/11/2016 and 02/04/2016.  CT 08/12/2016) FINDINGS: 1128 hours. Right IJ Port-A-Cath tip appears unchanged near the SVC right atrial junction. The heart size and mediastinal contours are stable with mild superior mediastinal widening and bilateral hilar distortion. There is grossly stable lung disease with architectural distortion, subpleural reticulation and honeycomb formation compared with recent studies. Previously demonstrated bilateral pulmonary nodularity is grossly stable, not well seen radiographically. No superimposed airspace disease or significant pleural effusion identified. The bones appear unchanged. IMPRESSION: Radiographically stable appearance of the chest compared with recent studies. There is underlying chronic lung disease with superimposed interstitial opacities which may be treatment related or inflammatory. Electronically Signed   By: Richardean Sale M.D.   On: 09/05/2016 11:48    Procedures Procedures (including critical care time)  +++++++++++++++++++++++++++++++++++++++++++++++  CRITICAL  CARE Performed by: Hoy Morn Total critical care time: 35 minutes Critical care time was exclusive of separately billable procedures and treating other patients. Critical care was necessary to treat or prevent imminent or life-threatening deterioration. Critical care was time spent personally by me on the following activities: development of treatment plan with patient and/or surrogate as well as nursing, discussions with consultants, evaluation of patient's response to treatment, examination of patient, obtaining history from patient or surrogate, ordering and performing treatments and interventions,  ordering and review of laboratory studies, ordering and review of radiographic studies, pulse oximetry and re-evaluation of patient's condition.  +++++++++++++++++++++++++++++++++++++++    Medications Ordered in ED Medications  iopamidol (ISOVUE-370) 76 % injection 100 mL (60 mLs Intravenous Contrast Given 09/05/16 1404)     Initial Impression / Assessment and Plan / ED Course  I have reviewed the triage vital signs and the nursing notes.  Pertinent labs & imaging results that were available during my care of the patient were reviewed by me and considered in my medical decision making (see chart for details).  Clinical Course     Patient with worsening metastatic spread of his cancer.  He likely has acute alveolar hemorrhage as the cause of his hypoxia and hemoptysis.  Clinically the patient has not had symptoms to suggest pneumonia as he said no fevers or chills.  I suspect this is all alveolar hemorrhage.  Patient does confirm he is a DO NOT RESUSCITATE and does not want to be placed on the ventilator nor does he want CPR.  He otherwise would like ongoing care at this time.  His blood noted in his stools is likely from swallowed hemoptysis.  Doubt concomitant GI bleed.  1 g drop in his hemoglobin.  Currently I don't think he is low enough to transfuse him blood.  Will need to trend his  hemoglobin.  He'll be admitted to the hospital.  He will need palliative care as well as oncology consultation for definitive plan for ongoing care as his cancer continues to worsen.  He's been informed of this and is currently working with his family the way his multiple options including ongoing chemotherapy versus a switch to a more palliative measures  Final Clinical Impressions(s) / ED Diagnoses   Final diagnoses:  Primary malignant neoplasm of lung metastatic to other site, unspecified laterality ([place])  Acute on chronic respiratory failure with hypoxia ([place])  Hemoptysis    New Prescriptions New Prescriptions   No medications on file     Jola Schmidt, MD 09/05/16 [place]

## 2016-09-06 ENCOUNTER — Inpatient Hospital Stay (HOSPITAL_COMMUNITY): Payer: Medicare Other

## 2016-09-06 DIAGNOSIS — R531 Weakness: Secondary | ICD-10-CM

## 2016-09-06 DIAGNOSIS — C3490 Malignant neoplasm of unspecified part of unspecified bronchus or lung: Secondary | ICD-10-CM

## 2016-09-06 DIAGNOSIS — Z515 Encounter for palliative care: Secondary | ICD-10-CM

## 2016-09-06 DIAGNOSIS — R0602 Shortness of breath: Secondary | ICD-10-CM

## 2016-09-06 DIAGNOSIS — R042 Hemoptysis: Secondary | ICD-10-CM

## 2016-09-06 DIAGNOSIS — Z7189 Other specified counseling: Secondary | ICD-10-CM

## 2016-09-06 DIAGNOSIS — J9621 Acute and chronic respiratory failure with hypoxia: Secondary | ICD-10-CM

## 2016-09-06 DIAGNOSIS — R0489 Hemorrhage from other sites in respiratory passages: Secondary | ICD-10-CM

## 2016-09-06 LAB — CBC
HCT: 22.8 % — ABNORMAL LOW (ref 39.0–52.0)
HEMOGLOBIN: 7.9 g/dL — AB (ref 13.0–17.0)
MCH: 30.2 pg (ref 26.0–34.0)
MCHC: 34.6 g/dL (ref 30.0–36.0)
MCV: 87 fL (ref 78.0–100.0)
PLATELETS: 68 10*3/uL — AB (ref 150–400)
RBC: 2.62 MIL/uL — ABNORMAL LOW (ref 4.22–5.81)
RDW: 22.3 % — ABNORMAL HIGH (ref 11.5–15.5)
WBC: 3.4 10*3/uL — ABNORMAL LOW (ref 4.0–10.5)

## 2016-09-06 LAB — COMPREHENSIVE METABOLIC PANEL
ALBUMIN: 3 g/dL — AB (ref 3.5–5.0)
ALK PHOS: 58 U/L (ref 38–126)
ALT: 12 U/L — AB (ref 17–63)
AST: 23 U/L (ref 15–41)
Anion gap: 9 (ref 5–15)
BUN: 9 mg/dL (ref 6–20)
CALCIUM: 8.9 mg/dL (ref 8.9–10.3)
CO2: 25 mmol/L (ref 22–32)
CREATININE: 0.54 mg/dL — AB (ref 0.61–1.24)
Chloride: 96 mmol/L — ABNORMAL LOW (ref 101–111)
GFR calc Af Amer: >60 mL/min (ref >60)
GFR calc non Af Amer: >60 mL/min (ref >60)
GLUCOSE: 170 mg/dL — AB (ref 65–99)
Potassium: 4.4 mmol/L (ref 3.5–5.1)
SODIUM: 130 mmol/L — AB (ref 135–145)
Total Bilirubin: 0.6 mg/dL (ref 0.3–1.2)
Total Protein: 6.3 g/dL — ABNORMAL LOW (ref 6.5–8.1)

## 2016-09-06 LAB — BLOOD GAS, ARTERIAL
Acid-Base Excess: 1.5 mmol/L (ref 0.0–2.0)
Bicarbonate: 25.5 mmol/L (ref 20.0–28.0)
Drawn by: 308601
FIO2: 100
O2 CONTENT: 15 L/min
O2 SAT: 98.8 %
PCO2 ART: 39.6 mmHg (ref 32.0–48.0)
Patient temperature: 97.9
pH, Arterial: 7.423 (ref 7.350–7.450)
pO2, Arterial: 117 mmHg — ABNORMAL HIGH (ref 83.0–108.0)

## 2016-09-06 LAB — PROTIME-INR
INR: 1.27
PROTHROMBIN TIME: 16 s — AB (ref 11.4–15.2)

## 2016-09-06 LAB — ABO/RH: ABO/RH(D): O POS

## 2016-09-06 MED ORDER — MORPHINE SULFATE (CONCENTRATE) 10 MG/0.5ML PO SOLN
5.0000 mg | ORAL | Status: DC | PRN
  Administered 2016-09-06: 5 mg via SUBLINGUAL
  Filled 2016-09-06: qty 0.5

## 2016-09-06 NOTE — Progress Notes (Signed)
Nutrition Brief Note  Patient identified on the Malnutrition Screening Tool (MST) Report  Noted patient will no longer receive palliative chemotherapy. Palliative care consult placed. RD will monitor for [place] decisions prior to assessment. PO intakes currently ranging from 30-65% of meals.  Wt Readings from Last 15 Encounters:  09/05/16 148 lb 3.2 oz (67.2 kg)  08/22/16 148 lb 3.2 oz (67.2 kg)  08/12/16 154 lb (69.9 kg)  08/01/16 154 lb (69.9 kg)  06/29/16 151 lb 14.4 oz (68.9 kg)  04/19/16 143 lb 6.4 oz (65 kg)  04/19/16 143 lb 1.6 oz (64.9 kg)  03/12/16 138 lb (62.6 kg)  03/01/16 142 lb 11.2 oz (64.7 kg)  02/23/16 147 lb 12.8 oz (67 kg)  02/17/16 146 lb 1.6 oz (66.3 kg)  02/07/16 149 lb 0.5 oz (67.6 kg)  01/24/16 156 lb 1.4 oz (70.8 kg)  01/09/16 161 lb 12.8 oz (73.4 kg)  12/13/12 163 lb (73.9 kg)    Body mass index is 21.26 kg/m. Patient meets criteria for normal range based on current BMI.   Current diet order is regular, patient is consuming approximately 30-65% of meals at this time. Labs and medications reviewed.   No nutrition interventions warranted at this time. If nutrition issues arise, please consult RD.   Clayton Bibles, MS, RD, LDN Pager: [phone] After Hours Pager: [phone]

## 2016-09-06 NOTE — Care Management Note (Signed)
Case Management Note  Patient Details  Name: Chad Wang MRN: [id number] Date of Birth: [birth year]  Subjective/Objective: 60 y/o m admitted w/pulmonary alveolar hemorrhage. Readmit-10/14-10/17. DNR. Active w/[facility] HHPT/OT-rep Manuela Schwartz aware & following. PMT cons-await recc.                   Action/Plan:d/c home w/HHC.   Expected Discharge Date:   (unknown)               Expected Discharge Plan:  [place]  In-House Referral:  Clinical Social Work  Discharge planning Services  CM Consult  Post Acute Care Choice:  Home Health, Durable Medical Equipment (Active w/[facility]-HHPT/OThas rw,3n1) Choice offered to:     DME Arranged:    DME Agency:     HH Arranged:    [place] Agency:     Status of Service:  In process, will continue to follow  If discussed at Long Length of Stay Meetings, dates discussed:    Additional Comments:  Dessa Phi, RN 09/06/2016, 1:40 PM

## 2016-09-06 NOTE — Progress Notes (Signed)
Shift event: At beginning of night shift, was called by RN that pt desatted into the 70s. NP to bedside. By the time of my arrival, O2 sat 100% on NRB. No respiratory distress noted. Pt states he feels fine. No hemoptysis recently. Will continue to watch.  Later in shift, RN paged because pt was difficult to arouse. NP to bedside. O2 sat 100% on NRB. Respirations even and unlabored. Opens eyes to sternal rub, but immediately closes them. ABG done to r/o hypercarbia given lung issues, but ABG looked good. Sedation is likely due to his bedtime antipsychotic medication as this was given about one hour prior to this event. Will watch.  Later, NP was on floor and RN stated pt had been awake a couple of times and was alert and talking since above.  KJKG, NP [facility]

## 2016-09-06 NOTE — Progress Notes (Signed)
  Progress Note   Date: 09/06/2016  Patient Name: Chad Wang        MRN#: [id number]  Review of the patient's clinical findings supports the diagnosis of  Acute on chronic blood loss anemia    Irwin Brakeman, MD

## 2016-09-06 NOTE — Consult Note (Signed)
Consultation Note Date: 09/06/2016   Patient Name: Chad Wang  DOB: 1956/08/13  MRN: [id number]  Age / Sex: 60 y.o., male  PCP: Wyatt Portela, MD Referring Physician: Murlean Iba, MD  Reason for Consultation: Establishing goals of care, Hospice Evaluation and Inpatient hospice referral  HPI/Patient Profile: 60 y.o. 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 CT scan revealed worsening lung cancer with likely alveolar hemorrhage.  Oncology recommended Hospice. Palliative medicine consulted for [place], and assistance with transition to Hospice.    Clinical Assessment and Goals of Care: Met with patient, his sisters- Chad Wang, community) and Chad Wang, with his brotherMarylyn Wang on speaker phone. Patient denies all complaints, stating he feels "great". Chad Wang and Chad Wang (patient's preferred name) are all in understanding that patient's cancer has progressed to an end of life situation. Their goals are for patient's comfort. Patient's brother Chad Wang has difficulty accepting and plans to visit and discuss with patient tonight. Chad Mercury and Chad Wang are interested in transitioning to comfort care. Chad Mercury would like to take [place] home. He is currently on NRB, Chad Mercury understands that patient may not be able to go home on NRB, in which case she requests  Discussed use of morphine for SOB symptoms and other SOB symptom management strategies.  Primary Decision Maker HCPOA- sister- Chad Mercury    SUMMARY OF RECOMMENDATIONS   -Symptom management for SOB:   -Morphine 67m IV q1hr prn severe SOB  -Morphine intesol (134m0.70mL) 70m74mL q1hr prn moderate SOB and as premed for any exertional activity  -Consider transition to long acting morphine or morphine drip after 24hr dose requirements  are obtained  -Fan blowing directly in face  -Keep room cool -Per Hospice liason- they are able to arrange O2 at home with NRB- Kim plans to discuss with siblings and we will reconvene tomorrow at 10am to discuss disposition- their brother is reluctant to transition to full comfort care and requests to discuss in person tomorrow morning  Code Status/Advance Care Planning:  DNR    Symptom Management:   As above  Palliative Prophylaxis:   Frequent assessment for SOB  Additional Recommendations (Limitations, Scope, Preferences):  Avoid Hospitalization, No Surgical Procedures and No Tracheostomy  Psycho-social/Spiritual:   Desire for further Chaplaincy support:No  Additional Recommendations: Education on Hospice  Prognosis:    < 2 weeks likely days-weeks due to aggressive progressing lung ca with respiratory failure and no further interventions planned  Discharge Planning: To Be Determined likely home with hospice vs. Residential hospice  Primary Diagnoses: Present on Admission: . Pulmonary alveolar hemorrhage   I have reviewed the medical record, interviewed the patient and family, and examined the patient. The following aspects are pertinent.  Past Medical History:  Diagnosis Date  . [place] ([facility]   lung / larynx  . [place] without complication [place] . GERD (gastroesophageal reflux disease)   .  Hyperlipemia   . Hypertension   . Schizophrenia [facility])    Social History   Social History  . Marital status: Single    Spouse name: N/A  . Number of children: N/A  . Years of education: N/A   Social History Main Topics  . Smoking status: Former Smoker    Types: Cigarettes    Quit date: 12/28/2015  . Smokeless tobacco: Never Used  . Alcohol use Yes  . Drug use: No  . Sexual activity: Yes   Other Topics Concern  . None   Social History Narrative  . None   Family History  Problem Relation Age of Onset  . CAD Mother   . Lung cancer Father     . CAD Brother   . Diabetes Other   . Stroke Neg Hx    Scheduled Meds: . cloZAPine  100 mg Oral QHS  . divalproex  500 mg Oral Daily   And  . divalproex  1,000 mg Oral QHS  . docusate sodium  100 mg Oral BID  . feeding supplement (ENSURE ENLIVE)  237 mL Oral BID BM  . ipratropium-albuterol  3 mL Nebulization Q6H  . methylPREDNISolone (SOLU-MEDROL) injection  40 mg Intravenous Q6H  . mometasone-formoterol  2 puff Inhalation BID  . sodium chloride flush  10-40 mL Intracatheter Q12H  . sodium chloride flush  3 mL Intravenous Q12H   Continuous Infusions: PRN Meds:.acetaminophen **OR** acetaminophen, albuterol, bisacodyl, bisacodyl, morphine CONCENTRATE, ondansetron **OR** ondansetron (ZOFRAN) IV, polyethylene glycol, prochlorperazine, sodium chloride flush, traMADol Medications Prior to Admission:  Prior to Admission medications   Medication Sig Start Date End Date Taking? Authorizing Provider  albuterol (PROVENTIL HFA;VENTOLIN HFA) 108 (90 Base) MCG/ACT inhaler Inhale 2 puffs into the lungs every 6 (six) hours as needed for wheezing or shortness of breath. 01/28/16  Yes Reyne Dumas, MD  bisacodyl (DULCOLAX) 5 MG EC tablet Take 5 mg by mouth daily as needed for moderate constipation.   Yes Historical Provider, MD  cloZAPine (CLOZARIL) 100 MG tablet Take 100 mg by mouth at bedtime.    Yes Historical Provider, MD  divalproex (DEPAKOTE ER) 500 MG 24 hr tablet Take 500-1,000 mg by mouth daily. 500 mg in the am and 1000 mg at bedtime   Yes Historical Provider, MD  docusate sodium (COLACE) 100 MG capsule Take 1 capsule (100 mg total) by mouth 2 (two) times daily. 01/28/16  Yes Reyne Dumas, MD  ENSURE PLUS (ENSURE PLUS) LIQD Take 237 mLs by mouth 3 (three) times daily between meals.    Yes Historical Provider, MD  gemfibrozil (LOPID) 600 MG tablet Take 600 mg by mouth 2 (two) times daily.   Yes Historical Provider, MD  lidocaine-prilocaine (EMLA) cream Apply 1 application topically as needed. Apply  to port before chemotherapy. 06/29/16  Yes Wyatt Portela, MD  metFORMIN (GLUCOPHAGE) 500 MG tablet Take 500 mg by mouth 2 (two) times daily with a meal.   Yes Historical Provider, MD  mometasone-formoterol (DULERA) 200-5 MCG/ACT AERO Inhale 2 puffs into the lungs 2 (two) times daily. 08/01/16  Yes Wyatt Portela, MD  prochlorperazine (COMPAZINE) 10 MG tablet Take 1 tablet (10 mg total) by mouth every 6 (six) hours as needed for nausea or vomiting. 08/01/16  Yes Wyatt Portela, MD  Wound Cleansers (RADIAPLEX EX) Apply 1 application topically daily as needed. Reported on 04/19/2016   Yes Historical Provider, MD  Wound Dressings (SONAFINE) Apply 1 application topically 3 (three) times daily. Reported on 04/19/2016 02/24/16  Yes Tyler Pita, MD  metFORMIN (GLUCOPHAGE) 1000 MG tablet Take 500 mg by mouth 2 (two) times daily with a meal.     Historical Provider, MD   No Known Allergies Review of Systems  Constitutional: Positive for appetite change and fatigue.  Respiratory: Positive for cough.   Gastrointestinal: Positive for vomiting.  Psychiatric/Behavioral: The patient is nervous/anxious.   All other systems reviewed and are negative.   Physical Exam  Constitutional: He is oriented to person, place, and time. He appears well-developed and well-nourished.  HENT:  Head: Normocephalic and atraumatic.  Eyes: Pupils are equal, round, and reactive to light.  Cardiovascular: Regular rhythm.   Tachycardic   Pulmonary/Chest: He has wheezes. He has rales.  Labored, coughing with Hemoptysis observed  Abdominal: Soft. Bowel sounds are normal.  Musculoskeletal: Normal range of motion.  Neurological: He is alert and oriented to person, place, and time.  Skin: Skin is warm and dry. There is pallor.  Psychiatric: He has a normal mood and affect. His behavior is normal. Judgment and thought content normal.    Vital Signs: BP (!) 143/75 (BP Location: Left Arm)   Pulse (!) 115   Temp 98.3 F (36.8 C)  (Axillary)   Resp 20   Ht 5' 10"  (1.778 m)   Wt 67.2 kg (148 lb 3.2 oz)   SpO2 100%   BMI 21.26 kg/m  Pain Assessment: No/denies pain POSS *See Group Information*: S-Acceptable,Sleep, easy to arouse Pain Score: 0-No pain   SpO2: SpO2: 100 % O2 Device:SpO2: 100 % O2 Flow Rate: .O2 Flow Rate (L/min): 15 L/min  IO: Intake/output summary:  Intake/Output Summary (Last 24 hours) at 09/06/16 1525 Last data filed at 09/06/16 1411  Gross per 24 hour  Intake              720 ml  Output             1150 ml  Net             -430 ml    LBM: Last BM Date: 09/04/16 Baseline Weight: Weight: 67.2 kg (148 lb 3.2 oz) Most recent weight: Weight: 67.2 kg (148 lb 3.2 oz)     Palliative Assessment/Data: PPS: 30%     Thank you for this consult. Palliative medicine will continue to follow and assist as needed.   Time In:1330 Time Out: 1500 Time Total: 90 minutes Greater than 50%  of this time was spent counseling and coordinating care related to the above assessment and plan.  Signed by: Mariana Kaufman, AGNP-C Palliative Medicine    Please contact Palliative Medicine Team phone at [phone] for questions and concerns.  For individual provider: See Shea Evans

## 2016-09-06 NOTE — Progress Notes (Signed)
PROGRESS NOTE    TRYTON BODI  ZHY:[id number]  DOB: [birth year]  DOA: 09/05/2016 PCP: [name], MD Outpatient Specialists:   Hospital course: HPI: Chad Wang is a 60 y.o. 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 Patient wears 4 Liters of [place] continuously and states he progressively gotten Short of Breath and stated that he started coughing/spitting up blood a few days ago. Last Chemotherapy Cycle was 2 weeks ago. Patient states he had a rough night and patient's brother checked on him this AM and he was severely short of breath gasping for air. Patient was brought in to the ED and found to be saturating at 72% on 4 Liters and then 82 on 6 Liters. He was placed on a non-rebreather and had imaging done which showed progressive of the metastatic diseases in the lungs bilaterally as well as increase in size in pulmonary nodules. The CT also read as having worsening multifocal airspace disease B/L favoring drug reaction but Alveolar Hemorrhage could not be excluded given patient's hemoptysis.   Assessment & Plan:   Acute on Chronic Hypoxic Respiratory Failure requiring Non Rebreather likely from Metastatic Cancer with concern for Alveolar Hemorrhage -Palliative Care and Hematology/Oncology  -Continue with NRB and maintain O2 Sats >92% -DuoNeb Breathing Tx and IV Solumedrol; C/w Dulera -Palliative Care Consultation in Place   Hemopytsis likely from Alveolar Hemorrhage -Possible Chemo Side-effect; Hematology/Oncology Notified by Dr. Annia Belt in ER -Continue to Monitor H/H; Hb/Hct decreased since last admission -CBC in AM  Squamous Cell Cancer of the Lung Now metastatisizing and Squamous Cell Cancer of the  Larynx -Appreciate Hematology/Oncology Consultation, no further palliative chemotherapy recommendations -Palliative Care Consultation  Diabetes Mellitus Type 2 -Continue to Monitor CBG's  Schizophrenia -C/w Clozapine and with Depakote ER Home Doses  Hyponatremia -Possibly Chemo-induced and SIADH from cancer -Continue to Monitor;  Constipation -C/w Docusate, Miralax, and Bisacodyl   Rectal Bleeding -Likely from Straining. Family opposed to GI Consultation -Continue to Monitor -FOBT pending  DVT prophylaxis: SCD's Code Status: DNR Family Communication: Discussed with Family at bedside and with HCPOA patient's sister at bedside. Disposition Plan: [place] Hospitalization Consults called: Hematology/Oncology Dr. Alen Blew and Palliative Care Consulted Admission status: Inpatient Telemetry  Subjective: Pt feels better with NRB, less SOB. NO pain.   Objective: Vitals:   09/05/16 2251 09/06/16 0032 09/06/16 0512 09/06/16 0736  BP: 138/73 112/62 116/73   Pulse: (!) 109 (!) 106 (!) 103   Resp: (!) '22 20 20   '$ Temp: 97.5 F (36.4 C) 97.9 F (36.6 C) 97.7 F (36.5 C)   TempSrc: Oral Axillary Axillary   SpO2: 95% 100% 100% 92%  Weight:      Height:        Intake/Output Summary (Last 24 hours) at 09/06/16 1346 Last data filed at 09/06/16 1000  Gross per 24 hour  Intake              480 ml  Output             1050 ml  Net             -570 ml   Filed Weights   09/05/16 1020  Weight: 67.2 kg (148 lb 3.2 oz)    Exam:  Constitutional: Thin ill appearing 60 yo Caucasian male, no  apparent distress.  Eyes:  Llids and conjunctivae normal, sclerae anicteric  ENMT: External Ears, Nose appear normal. Grossly normal hearing.  Neck: Appears normal, supple, no cervical masses, normal ROM, no appreciable thyromegaly Respiratory: Diminished bilaterally, no wheezing, rales, rhonchi or crackles. Increased work of breathing and increased respirations. No accessory muscle use.   Cardiovascular: Tachycardic Rhythmn, no murmurs / rubs / gallops. S1 and S2 auscultated. No LE extremity edema. 2+ pedal pulses.  Abdomen: Soft, non-tender, non-distended. No masses palpated. No appreciable hepatosplenomegaly. Bowel sounds positive.  GU: Deferred. Musculoskeletal: No clubbing / cyanosis of digits/nails. No joint deformity upper and lower extremities. .  Skin: No rashes, lesions, ulcers. No induration; Warm and dry.  Neurologic: CN 2-12 grossly intact with no focal deficits. Sensation intact in all 4 Extremities. Romberg sign cerebellar reflexes not assessed.  Psychiatric: Normal judgment and insight. Alert and oriented x 3. Depressed mood and Flat affect.   Data Reviewed: Basic Metabolic Panel:  Recent Labs Lab 09/05/16 1219 09/06/16 0520  NA 127* 130*  K 4.4 4.4  CL 94* 96*  CO2 25 25  GLUCOSE 111* 170*  BUN 8 9  CREATININE 0.47* 0.54*  CALCIUM 8.9 8.9   Liver Function Tests:  Recent Labs Lab 09/05/16 1219 09/06/16 0520  AST 22 23  ALT 13* 12*  ALKPHOS 62 58  BILITOT 0.5 0.6  PROT 6.6 6.3*  ALBUMIN 3.2* 3.0*   No results for input(s): LIPASE, AMYLASE in the last 168 hours. No results for input(s): AMMONIA in the last 168 hours. CBC:  Recent Labs Lab 09/05/16 1219 09/05/16 2156 09/06/16 0520  WBC 7.9 8.8 3.4*  NEUTROABS  --  8.5*  --   HGB 8.4* 8.6* 7.9*  HCT 23.6* 24.7* 22.8*  MCV 85.8 86.7 87.0  PLT 67* 68* 68*   Cardiac Enzymes:  Recent Labs Lab 09/05/16 1219  TROPONINI <0.03   CBG (last 3)  No results for input(s): GLUCAP in the last 72 hours. No results found for this or any previous visit (from the past 240 hour(s)).   Studies: X-ray Chest Pa And Lateral  Result Date: 09/06/2016 CLINICAL DATA:  Shortness of breath, lung cancer EXAM: CHEST  2 VIEW COMPARISON:  09/05/2016 FINDINGS: Right sided Port-A-Cath with the tip projecting over the SVC. Cardiomediastinal silhouette is stable. Stable bilateral interstitial and alveolar  airspace opacities. No significant interval change accounting for the differences in technique. Stable lung disease with architectural distortion. Left perihilar focal airspace opacity is stable extending to the pleural surface. No new area of airspace disease. Small bilateral pleural effusions. No pneumothorax. No acute osseous abnormality. IMPRESSION: 1. Stable bilateral interstitial and alveolar airspace opacities without significant interval change compared with the prior exam. 2. Stable metastatic lung disease. Electronically Signed   By: Kathreen Devoid   On: 09/06/2016 09:20   Ct Angio Chest Pe W And/or Wo Contrast  Result Date: 09/05/2016 CLINICAL DATA:  60 year old male with history of chronic shortness of breath, cough and congestion presenting with weakness and intermittent hemoptysis 2-3 times per week. Chest pain while coughing. History of lung cancer, with last chemotherapy 2 weeks ago. EXAM: CT ANGIOGRAPHY CHEST WITH CONTRAST TECHNIQUE: Multidetector CT imaging of the chest was performed using the standard protocol during bolus administration of intravenous contrast. Multiplanar CT image reconstructions and MIPs were obtained to evaluate the vascular anatomy. CONTRAST:  100 mL of Isovue 370. COMPARISON:  Chest CT 08/12/2016. FINDINGS: Cardiovascular: There are no filling defects within the pulmonary atoll tree to suggest  underlying pulmonary embolism. Heart size is normal. There is no significant pericardial fluid, thickening or pericardial calcification. There is aortic atherosclerosis, as well as atherosclerosis of the great vessels of the mediastinum and the coronary arteries, including calcified atherosclerotic plaque in the left anterior descending and left circumflex coronary arteries. Right internal jugular single-lumen porta cath with tip terminating in the right atrium. Mediastinum/Nodes: Multiple borderline enlarged and mildly enlarged mediastinal and hilar lymph nodes are noted, measuring  up to 1.2 cm in the right hilum. Esophagus is unremarkable in appearance. No axillary lymphadenopathy. Lungs/Pleura: Treated left upper lobe mass is again noted, measuring 4.3 x 2.6 cm on today's study (image 27 of series 11), very similar to the prior examination. Multiple additional pulmonary nodules and masses are again noted, largest of which is in the central aspect of the right lung abutting the hilum involving either the posterior aspect of the right middle lobe and/or the anterior aspect of the right lower lobe (in close association with the major fissure), measuring 3.1 x 2.6 cm (image 48 of series 4). Additional right lower lobe nodules measure 1.8 x 2.7 cm (image 58 of series 4) and 1.5 x 1.1 cm (image 53 of series 4), slightly increased compared to the prior study. Enlarging right upper lobe pulmonary nodule near the apex also noted measuring 1.9 x 1.4 cm (image 19 of series 4). Relatively diffuse nodular thickening of the pleural surfaces in the thorax bilaterally. Small bilateral pleural effusions (left greater than right), layering dependently on the left, and partially loculated in the medial aspect of the posterior right hemithorax. Worsening patchy areas of ground-glass attenuation and septal thickening throughout the lungs bilaterally, favored to reflect drug reaction, although a component of alveolar hemorrhage is also possible given the patient's history of recent hemoptysis. Diffuse bronchial wall thickening with moderate centrilobular emphysema. Upper Abdomen: Small calcified granuloma in the right lobe of the liver incidentally noted. Musculoskeletal: There are no aggressive appearing lytic or blastic lesions noted in the visualized portions of the skeleton. Review of the MIP images confirms the above findings. IMPRESSION: 1. No evidence of pulmonary embolism. 2. Progression of metastatic disease in the lungs bilaterally, as above, with interval increase in size of numerous right-sided  pulmonary nodules, persistent widespread nodular pleural thickening, and increasing right hilar lymphadenopathy. 3. Worsening multifocal airspace disease in the lungs bilaterally, favored to predominantly reflect drug reaction, although a component of alveolar hemorrhage is also possible given the patient's recent history of hemoptysis. 4. Diffuse bronchial wall thickening with moderate centrilobular emphysema; imaging findings suggestive of underlying COPD. 5. Aortic atherosclerosis, in addition to three-vessel coronary artery disease. Please note that although the presence of coronary artery calcium documents the presence of coronary artery disease, the severity of this disease and any potential stenosis cannot be assessed on this non-gated CT examination. Assessment for potential risk factor modification, dietary therapy or pharmacologic therapy may be warranted, if clinically indicated. Electronically Signed   By: Vinnie Langton M.D.   On: 09/05/2016 14:47   Dg Chest Portable 1 View  Result Date: 09/05/2016 CLINICAL DATA:  Chronic shortness of breath with cough, congestion and weakness. Intermittent hemoptysis for 2 or 3 weeks. History of lung cancer and pneumonia. EXAM: PORTABLE CHEST 1 VIEW COMPARISON:  Radiographs 08/11/2016 and 02/04/2016.  CT 08/12/2016) FINDINGS: 1128 hours. Right IJ Port-A-Cath tip appears unchanged near the SVC right atrial junction. The heart size and mediastinal contours are stable with mild superior mediastinal widening and bilateral hilar distortion. There is  grossly stable lung disease with architectural distortion, subpleural reticulation and honeycomb formation compared with recent studies. Previously demonstrated bilateral pulmonary nodularity is grossly stable, not well seen radiographically. No superimposed airspace disease or significant pleural effusion identified. The bones appear unchanged. IMPRESSION: Radiographically stable appearance of the chest compared with  recent studies. There is underlying chronic lung disease with superimposed interstitial opacities which may be treatment related or inflammatory. Electronically Signed   By: Richardean Sale M.D.   On: 09/05/2016 11:48   Scheduled Meds: . cloZAPine  100 mg Oral QHS  . divalproex  500 mg Oral Daily   And  . divalproex  1,000 mg Oral QHS  . docusate sodium  100 mg Oral BID  . feeding supplement (ENSURE ENLIVE)  237 mL Oral BID BM  . ipratropium-albuterol  3 mL Nebulization Q6H  . methylPREDNISolone (SOLU-MEDROL) injection  40 mg Intravenous Q6H  . mometasone-formoterol  2 puff Inhalation BID  . sodium chloride flush  10-40 mL Intracatheter Q12H  . sodium chloride flush  3 mL Intravenous Q12H   Continuous Infusions:  Active Problems:   Pulmonary alveolar hemorrhage  Time spent:   Irwin Brakeman, MD, FAAFP [facility] Pager [phone]  If 7PM-7AM, please contact night-coverage [email or web address] Password [email or web address] 09/06/2016, 1:46 PM    LOS: 1 day

## 2016-09-06 NOTE — Progress Notes (Signed)
IP PROGRESS NOTE  Subjective:   Mr. 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 He is currently receiving palliative chemotherapy and was hospitalized on 09/05/2016 for respiratory distress. CT scan of the chest obtained showed no evidence of pulmonary embolism but increase in his cancer progression.  This morning, he appears comfortable although requiring higher oxygen supplementation. He has very little cough at this time and appears to be mentating well. He is not in any pain.  Objective:  Vital signs in last 24 hours: Temp:  [96.5 F (35.8 C)-97.9 F (36.6 C)] 97.7 F (36.5 C) (11/09 0512) Pulse Rate:  [98-128] 103 (11/09 0512) Resp:  [16-26] 20 (11/09 0512) BP: (108-138)/(62-91) 116/73 (11/09 0512) SpO2:  [71 %-100 %] 100 % (11/09 0512) FiO2 (%):  [100 %] 100 % (11/08 1949) Weight:  [148 lb 3.2 oz (67.2 kg)] 148 lb 3.2 oz (67.2 kg) (11/08 1020) Weight change:  Last BM Date: 09/04/16  Intake/Output from previous day: 11/08 0701 - 11/09 0700 In: 240 [P.O.:240] Out: 800 [Urine:800] Alert, awake gentleman chronically ill-appearing. Mouth: mucous membranes moist, pharynx normal without lesions Resp: rhonchi bilaterally and RLL Cardio: regular rate and rhythm, S1, S2 normal, no murmur, click, rub or gallop GI: soft, non-tender; bowel sounds normal; no masses,  no organomegaly Extremities: extremities normal, atraumatic, no cyanosis or edema    Lab Results:  Recent Labs  09/05/16 2156 09/06/16 0520  WBC 8.8 3.4*  HGB 8.6* 7.9*  HCT 24.7* 22.8*  PLT 68* 68*    BMET  Recent Labs  09/05/16 1219 09/06/16 0520  NA 127* 130*  K 4.4 4.4  CL 94* 96*  CO2 25 25  GLUCOSE 111* 170*  BUN 8 9  CREATININE 0.47* 0.54*  CALCIUM 8.9 8.9    Studies/Results: Ct Angio Chest Pe W And/or Wo Contrast  Result Date: 09/05/2016 CLINICAL DATA:  60 year old male with history of chronic shortness of breath, cough and congestion presenting  with weakness and intermittent hemoptysis 2-3 times per week. Chest pain while coughing. History of lung cancer, with last chemotherapy 2 weeks ago. EXAM: CT ANGIOGRAPHY CHEST WITH CONTRAST TECHNIQUE: Multidetector CT imaging of the chest was performed using the standard protocol during bolus administration of intravenous contrast. Multiplanar CT image reconstructions and MIPs were obtained to evaluate the vascular anatomy. CONTRAST:  100 mL of Isovue 370. COMPARISON:  Chest CT 08/12/2016. FINDINGS: Cardiovascular: There are no filling defects within the pulmonary atoll tree to suggest underlying pulmonary embolism. Heart size is normal. There is no significant pericardial fluid, thickening or pericardial calcification. There is aortic atherosclerosis, as well as atherosclerosis of the great vessels of the mediastinum and the coronary arteries, including calcified atherosclerotic plaque in the left anterior descending and left circumflex coronary arteries. Right internal jugular single-lumen porta cath with tip terminating in the right atrium. Mediastinum/Nodes: Multiple borderline enlarged and mildly enlarged mediastinal and hilar lymph nodes are noted, measuring up to 1.2 cm in the right hilum. Esophagus is unremarkable in appearance. No axillary lymphadenopathy. Lungs/Pleura: Treated left upper lobe mass is again noted, measuring 4.3 x 2.6 cm on today's study (image 27 of series 11), very similar to the prior examination. Multiple additional pulmonary nodules and masses are again noted, largest of which is in the central aspect of the right lung abutting the hilum involving either the posterior aspect of the right middle lobe and/or the anterior aspect of the right lower lobe (in close association with the major fissure),  measuring 3.1 x 2.6 cm (image 48 of series 4). Additional right lower lobe nodules measure 1.8 x 2.7 cm (image 58 of series 4) and 1.5 x 1.1 cm (image 53 of series 4), slightly increased  compared to the prior study. Enlarging right upper lobe pulmonary nodule near the apex also noted measuring 1.9 x 1.4 cm (image 19 of series 4). Relatively diffuse nodular thickening of the pleural surfaces in the thorax bilaterally. Small bilateral pleural effusions (left greater than right), layering dependently on the left, and partially loculated in the medial aspect of the posterior right hemithorax. Worsening patchy areas of ground-glass attenuation and septal thickening throughout the lungs bilaterally, favored to reflect drug reaction, although a component of alveolar hemorrhage is also possible given the patient's history of recent hemoptysis. Diffuse bronchial wall thickening with moderate centrilobular emphysema. Upper Abdomen: Small calcified granuloma in the right lobe of the liver incidentally noted. Musculoskeletal: There are no aggressive appearing lytic or blastic lesions noted in the visualized portions of the skeleton. Review of the MIP images confirms the above findings. IMPRESSION: 1. No evidence of pulmonary embolism. 2. Progression of metastatic disease in the lungs bilaterally, as above, with interval increase in size of numerous right-sided pulmonary nodules, persistent widespread nodular pleural thickening, and increasing right hilar lymphadenopathy. 3. Worsening multifocal airspace disease in the lungs bilaterally, favored to predominantly reflect drug reaction, although a component of alveolar hemorrhage is also possible given the patient's recent history of hemoptysis. 4. Diffuse bronchial wall thickening with moderate centrilobular emphysema; imaging findings suggestive of underlying COPD. 5. Aortic atherosclerosis, in addition to three-vessel coronary artery disease. Please note that although the presence of coronary artery calcium documents the presence of coronary artery disease, the severity of this disease and any potential stenosis cannot be assessed on this non-gated CT  examination. Assessment for potential risk factor modification, dietary therapy or pharmacologic therapy may be warranted, if clinically indicated. Electronically Signed   By: Vinnie Langton M.D.   On: 09/05/2016 14:47      Medications: I have reviewed the patient's current medications.  Assessment/Plan:   60 year old gentleman with the following issues:  1. Squamous cell carcinoma of the lung presenting with endobronchial lesion and left lower lung collapse.   He is status post definitive radiation to the thorax and his PET CT scan obtained on 04/18/2016 showed no residual disease.  He is currently receiving salvage chemotherapy with carboplatin and gemcitabine and have tolerated it well. After 2 cycles of therapy, he did have a CT scan on 08/12/2016 showed possible progression of disease.  He received one more cycles of chemotherapy on 08/22/2016 and was hospitalized on 09/05/2016 for dyspnea on exertion. CT scan obtained on 09/05/2016 showed progression of disease and he is clinically declining with symptoms of hemoptysis and shortness of breath.  Given these findings, it is clear that his disease is progressing and not responding to palliative chemotherapy. I have recommended permanent discontinuation of chemotherapy on hospice enrollment. I see very little reversible causes for his shortness of breath and he will continue to have issues with dyspnea on exertion and recurrent hemoptysis because of his malignancy.  2. Prognosis: Very poor with limited life expectancy of less than 6 months. I agree with DO NOT RESUSCITATE status and palliative medicine consult for goals of care and possible residential hospice placement.     LOS: 1 day   [facility] 09/06/2016, 7:42 AM

## 2016-09-06 NOTE — Progress Notes (Signed)
Upon arrival into room, pt was lethargic and difficult to arouse. This RN did a sternal rub, pt did not respond. Called another RN on the floor into the room, also performed a sternal rub. Pt eyes flickered but pt did not respond. VSS, charge nurse called into room. On-call paged, Tylene Fantasia came to room to assess patient. Order placed for ABG. On-call called to say ABG looked fine, accredited lethargy to antipsychotic medication. Will continue to monitor closely.

## 2016-09-06 NOTE — Progress Notes (Signed)
Patient placed on non-rebreather device to assist with Shortness of Breath

## 2016-09-07 ENCOUNTER — Telehealth: Payer: Self-pay | Admitting: *Deleted

## 2016-09-07 DIAGNOSIS — Z710 Person encountering health services to consult on behalf of another person: Secondary | ICD-10-CM

## 2016-09-07 DIAGNOSIS — Z515 Encounter for palliative care: Secondary | ICD-10-CM

## 2016-09-07 MED ORDER — MORPHINE SULFATE (PF) 2 MG/ML IV SOLN: 2.0000 mg | INTRAVENOUS | Status: DC | PRN

## 2016-09-07 MED ORDER — LORAZEPAM 1 MG PO TABS
1.0000 mg | ORAL_TABLET | ORAL | 0 refills | Status: AC | PRN
Start: 2016-09-07 — End: ?

## 2016-09-07 MED ORDER — TRAZODONE HCL 50 MG PO TABS: 25.0000 mg | ORAL_TABLET | Freq: Every evening | ORAL | Status: DC | PRN

## 2016-09-07 MED ORDER — HALOPERIDOL 0.5 MG PO TABS: 0.5000 mg | ORAL_TABLET | ORAL | Status: AC | PRN

## 2016-09-07 MED ORDER — IPRATROPIUM-ALBUTEROL 0.5-2.5 (3) MG/3ML IN SOLN: 3.0000 mL | Freq: Four times a day (QID) | RESPIRATORY_TRACT | Status: AC

## 2016-09-07 MED ORDER — GLYCOPYRROLATE 0.2 MG/ML IJ SOLN
0.2000 mg | INTRAMUSCULAR | Status: DC | PRN
  Filled 2016-09-07: qty 1

## 2016-09-07 MED ORDER — ONDANSETRON HCL 4 MG/2ML IJ SOLN: 4.0000 mg | Freq: Four times a day (QID) | INTRAMUSCULAR | Status: DC | PRN

## 2016-09-07 MED ORDER — POLYVINYL ALCOHOL 1.4 % OP SOLN
1.0000 [drp] | Freq: Four times a day (QID) | OPHTHALMIC | Status: DC | PRN
  Filled 2016-09-07: qty 15

## 2016-09-07 MED ORDER — POLYVINYL ALCOHOL 1.4 % OP SOLN: 1.0000 [drp] | Freq: Four times a day (QID) | OPHTHALMIC | 0 refills | Status: AC | PRN

## 2016-09-07 MED ORDER — HALOPERIDOL LACTATE 5 MG/ML IJ SOLN: 0.5000 mg | INTRAMUSCULAR | Status: DC | PRN

## 2016-09-07 MED ORDER — ACETAMINOPHEN 650 MG RE SUPP: 650.0000 mg | Freq: Four times a day (QID) | RECTAL | Status: DC | PRN

## 2016-09-07 MED ORDER — DIPHENHYDRAMINE HCL 50 MG/ML IJ SOLN: 12.5000 mg | INTRAMUSCULAR | Status: DC | PRN

## 2016-09-07 MED ORDER — LORAZEPAM 2 MG/ML PO CONC: 1.0000 mg | ORAL | Status: DC | PRN

## 2016-09-07 MED ORDER — BIOTENE DRY MOUTH MT LIQD: 15.0000 mL | OROMUCOSAL | Status: DC | PRN

## 2016-09-07 MED ORDER — HALOPERIDOL LACTATE 2 MG/ML PO CONC
0.5000 mg | ORAL | Status: DC | PRN
  Filled 2016-09-07: qty 0.3

## 2016-09-07 MED ORDER — GLYCOPYRROLATE 1 MG PO TABS
1.0000 mg | ORAL_TABLET | ORAL | Status: DC | PRN
  Filled 2016-09-07: qty 1

## 2016-09-07 MED ORDER — GLYCOPYRROLATE 1 MG PO TABS: 1.0000 mg | ORAL_TABLET | ORAL | Status: AC | PRN

## 2016-09-07 MED ORDER — ACETAMINOPHEN 325 MG PO TABS
650.0000 mg | ORAL_TABLET | Freq: Four times a day (QID) | ORAL | Status: DC | PRN
Start: 2016-09-07 — End: 2016-09-07

## 2016-09-07 MED ORDER — BISACODYL 10 MG RE SUPP: 10.0000 mg | Freq: Every day | RECTAL | 0 refills | Status: AC | PRN

## 2016-09-07 MED ORDER — BISACODYL 10 MG RE SUPP
10.0000 mg | Freq: Every day | RECTAL | Status: DC | PRN
Start: 2016-09-07 — End: 2016-09-07

## 2016-09-07 MED ORDER — LORAZEPAM 1 MG PO TABS: 1.0000 mg | ORAL_TABLET | ORAL | Status: DC | PRN

## 2016-09-07 MED ORDER — GLYCOPYRROLATE 0.2 MG/ML IJ SOLN: 0.2000 mg | INTRAMUSCULAR | Status: AC | PRN

## 2016-09-07 MED ORDER — MORPHINE SULFATE (CONCENTRATE) 10 MG/0.5ML PO SOLN: 5.0000 mg | ORAL | Status: AC | PRN

## 2016-09-07 MED ORDER — HALOPERIDOL 0.5 MG PO TABS
0.5000 mg | ORAL_TABLET | ORAL | Status: DC | PRN
  Filled 2016-09-07: qty 1

## 2016-09-07 MED ORDER — LORAZEPAM 2 MG/ML PO CONC: 1.0000 mg | ORAL | 0 refills | Status: AC | PRN

## 2016-09-07 MED ORDER — ONDANSETRON 4 MG PO TBDP: 4.0000 mg | ORAL_TABLET | Freq: Four times a day (QID) | ORAL | Status: DC | PRN

## 2016-09-07 MED ORDER — LORAZEPAM 2 MG/ML IJ SOLN: 1.0000 mg | INTRAMUSCULAR | Status: DC | PRN

## 2016-09-07 NOTE — Progress Notes (Signed)
Patient is set to discharge to [facility] today. Patient & family at bedside aware. Discharge packet given to RN, Stanton Kidney. [facility] called for transport.     Raynaldo Opitz, [facility] Clinical Social Worker cell #: [phone]

## 2016-09-07 NOTE — Telephone Encounter (Signed)
Chad Wang with hospice calling to let dr Alen Blew, the attending know, that patient will be transferred to [facility] today, after discharge from hospital.

## 2016-09-07 NOTE — Progress Notes (Signed)
Interventions in the last 24 hours noted. Patient appears comfortable sleeping this morning.  His prognosis was discussed with his family on 09/06/2016 and I have recommended comfort care only given the rapid progression of his disease and his poor prognosis. He is not a candidate for any further systemic therapy given his poor performance status and aggressive nature of his cancer. I anticipate very limited life expectancy at this time.  I have recommended his final disposition to be home with hospice care versus residential hospice depending on the family preferences.  Please call with any questions regarding this gentleman.

## 2016-09-07 NOTE — Progress Notes (Signed)
Pt discharged to [facility] hospice.  Left port accessed per [facility] request.  Pt transported via [facility] and still on 15L O2.

## 2016-09-07 NOTE — Progress Notes (Signed)
[place] of [facility] RN Liaison Visit  Met with patient and his two sisters.  Patient desires transfer to [facility] today and signed consents.  Will be transferring today and [place] aware.  Thank you for sending DNR with patient.  Report can be called to [facility] at [phone].  Thank you, Moss Mc, RN , BSN, [facility] Director of Inpatient Services [facility] is now on [facility]. Please feel free to contact our liaison team directly or call the main number at [phone].

## 2016-09-07 NOTE — Care Management Note (Signed)
Case Management Note  Patient Details  Name: Chad Wang MRN: [id number] Date of Birth: 1956/06/06  Subjective/Objective:                    Action/Plan:d/c residential hospice facility-[facility]-CSW managing.   Expected Discharge Date:   (unknown)               Expected Discharge Plan:  [place]  In-House Referral:  Clinical Social Work  Discharge planning Services  CM Consult  Post Acute Care Choice:  Home Health, Durable Medical Equipment (Active w/[facility] rw,3n1) Choice offered to:     DME Arranged:    DME Agency:     HH Arranged:    [place] Agency:     Status of Service:  Completed, signed off  If discussed at [employer] [facility] [employer], dates discussed:    Additional Comments:  Dessa Phi, RN 09/07/2016, 1:08 PM

## 2016-09-07 NOTE — Progress Notes (Signed)
CSW received consult that patient's family is now requesting residential hospice - [facility] is first choice. CSW confirmed with patient's sister, Joelene Millin & made referral to [facility] with [place]. Awaiting response re: bed availability.    Raynaldo Opitz, [facility] Clinical Social Worker cell #: [phone]

## 2016-09-07 NOTE — Discharge Summary (Signed)
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 Patient wears 4 Liters of [place]. continuously and states he progressively gotten Short of Breath and stated that he started coughing/spitting up blood a few days ago. Last Chemotherapy Cycle was 2 weeks ago. Patient states he had a rough night and patient's brother checked on him this AM and he was severely short of breath gasping for air. Patient was brought in to the ED and found to be saturating at 72% on 4 Liters and then 82 on 6 Liters. He was placed on a non-rebreather and had imaging done which showed progressive of the metastatic diseases in the lungs bilaterally as well as increase in size in pulmonary nodules. The CT also read as having worsening multifocal airspace disease B/L favoring drug reaction but Alveolar Hemorrhage could not be excluded given patient's hemoptysis.  His prognosis was discussed with his family on 09/06/2016 and I have recommended comfort care only given the rapid progression of his disease and his poor prognosis. He is not a candidate for any further systemic therapy given his poor performance status and aggressive nature of his cancer. I anticipate very limited life expectancy at this time.  I have recommended his final disposition to be home with hospice care versus residential hospice depending on the family  preferences.  Palliative medicine met with the family and they agreed with residential hospice placement and full comfort care.  Discharge Diagnoses:  Active Problems:   Pulmonary alveolar hemorrhage   Hemoptysis   Primary malignant neoplasm of lung metastatic to other site (HCC)   SOB (shortness of breath)   Goals of care, counseling/discussion   Palliative care by specialist   Shortness of breath   Terminal care   Discussion about advance care planning held with family member  Discharge Instructions    Medication List    STOP taking these medications   albuterol 108 (90 Base) MCG/ACT inhaler Commonly known as:  PROVENTIL HFA;VENTOLIN HFA   bisacodyl 5 MG EC tablet Commonly known as:  DULCOLAX Replaced by:  bisacodyl 10 MG suppository   cloZAPine 100 MG tablet Commonly known as:  CLOZARIL   divalproex 500 MG 24 hr tablet Commonly known as:  DEPAKOTE ER   docusate sodium 100 MG capsule Commonly known as:  COLACE   ENSURE PLUS Liqd   gemfibrozil 600 MG tablet Commonly known as:  LOPID   lidocaine-prilocaine cream Commonly known as:  EMLA   metFORMIN 1000 MG tablet Commonly known as:  GLUCOPHAGE   metFORMIN 500 MG tablet Commonly known as:  GLUCOPHAGE   mometasone-formoterol 200-5 MCG/ACT Aero Commonly known as:  DULERA   prochlorperazine 10 MG tablet Commonly known as:  COMPAZINE   RADIAPLEX EX   SONAFINE     TAKE these medications   bisacodyl 10 MG suppository Commonly known as:  DULCOLAX Place 1 suppository (10 mg total) rectally daily as needed for moderate constipation. Replaces:  bisacodyl 5 MG EC tablet   glycopyrrolate 1 MG tablet Commonly  known as:  ROBINUL Take 1 tablet (1 mg total) by mouth every 4 (four) hours as needed (excessive secretions).   glycopyrrolate 0.2 MG/ML injection Commonly known as:  ROBINUL Inject 1 mL (0.2 mg total) into the skin every 4 (four) hours as needed (excessive secretions).   haloperidol 0.5 MG  tablet Commonly known as:  HALDOL Take 1 tablet (0.5 mg total) by mouth every 4 (four) hours as needed for agitation (or delirium).   ipratropium-albuterol 0.5-2.5 (3) MG/3ML Soln Commonly known as:  DUONEB Take 3 mLs by nebulization every 6 (six) hours.   LORazepam 2 MG/ML concentrated solution Commonly known as:  ATIVAN Place 0.5 mLs (1 mg total) under the tongue every 4 (four) hours as needed for anxiety.   LORazepam 1 MG tablet Commonly known as:  ATIVAN Take 1 tablet (1 mg total) by mouth every 4 (four) hours as needed for anxiety.   morphine CONCENTRATE 10 MG/0.5ML Soln concentrated solution Place 0.25 mLs (5 mg total) under the tongue every 2 (two) hours as needed for moderate pain, severe pain, anxiety or shortness of breath.   polyvinyl alcohol 1.4 % ophthalmic solution Commonly known as:  LIQUIFILM TEARS Place 1 drop into both eyes 4 (four) times daily as needed for dry eyes.      Follow-up Information    SHADAD,FIRAS, MD.   Specialty:  Oncology Contact information: [address] [place] [id number] [phone]          No Known Allergies  Procedures/Studies: X-ray Chest Pa And Lateral  Result Date: 09/06/2016 CLINICAL DATA:  Shortness of breath, lung cancer EXAM: CHEST  2 VIEW COMPARISON:  09/05/2016 FINDINGS: Right sided Port-A-Cath with the tip projecting over the SVC. Cardiomediastinal silhouette is stable. Stable bilateral interstitial and alveolar airspace opacities. No significant interval change accounting for the differences in technique. Stable lung disease with architectural distortion. Left perihilar focal airspace opacity is stable extending to the pleural surface. No new area of airspace disease. Small bilateral pleural effusions. No pneumothorax. No acute osseous abnormality. IMPRESSION: 1. Stable bilateral interstitial and alveolar airspace opacities without significant interval change compared with the prior exam. 2. Stable metastatic lung  disease. Electronically Signed   By: Kathreen Devoid   On: 09/06/2016 09:20   Dg Chest 2 View  Result Date: 08/12/2016 CLINICAL DATA:  Acute onset of productive cough and shortness of breath. Generalized weakness. Initial encounter. EXAM: CHEST  2 VIEW COMPARISON:  Chest radiograph performed 02/04/2016, and CT of the chest performed 06/21/2016 FINDINGS: The lungs are well-aerated. Vascular congestion is noted. Increased interstitial markings raise concern for pulmonary edema. This is superimposed on the patient's bilateral pulmonary masses, reflecting known metastatic disease. Superimposed pneumonia could have a similar appearance. The heart is normal in size; the mediastinal contour is within normal limits. A right-sided chest port is noted ending about the distal SVC. No acute osseous abnormalities are seen. IMPRESSION: Vascular congestion noted. Increased interstitial markings raise concern for pulmonary edema, superimposed on the patient's known bilateral metastatic disease. Superimposed pneumonia could have a similar appearance. Electronically Signed   By: Garald Balding M.D.   On: 08/12/2016 00:13   Ct Angio Chest Pe W And/or Wo Contrast  Result Date: 09/05/2016 CLINICAL DATA:  60 year old male with history of chronic shortness of breath, cough and congestion presenting with weakness and intermittent hemoptysis 2-3 times per week. Chest pain while coughing. History of lung cancer, with last chemotherapy 2 weeks ago. EXAM: CT ANGIOGRAPHY CHEST WITH CONTRAST TECHNIQUE: Multidetector  CT imaging of the chest was performed using the standard protocol during bolus administration of intravenous contrast. Multiplanar CT image reconstructions and MIPs were obtained to evaluate the vascular anatomy. CONTRAST:  100 mL of Isovue 370. COMPARISON:  Chest CT 08/12/2016. FINDINGS: Cardiovascular: There are no filling defects within the pulmonary atoll tree to suggest underlying pulmonary embolism. Heart size is normal.  There is no significant pericardial fluid, thickening or pericardial calcification. There is aortic atherosclerosis, as well as atherosclerosis of the great vessels of the mediastinum and the coronary arteries, including calcified atherosclerotic plaque in the left anterior descending and left circumflex coronary arteries. Right internal jugular single-lumen porta cath with tip terminating in the right atrium. Mediastinum/Nodes: Multiple borderline enlarged and mildly enlarged mediastinal and hilar lymph nodes are noted, measuring up to 1.2 cm in the right hilum. Esophagus is unremarkable in appearance. No axillary lymphadenopathy. Lungs/Pleura: Treated left upper lobe mass is again noted, measuring 4.3 x 2.6 cm on today's study (image 27 of series 11), very similar to the prior examination. Multiple additional pulmonary nodules and masses are again noted, largest of which is in the central aspect of the right lung abutting the hilum involving either the posterior aspect of the right middle lobe and/or the anterior aspect of the right lower lobe (in close association with the major fissure), measuring 3.1 x 2.6 cm (image 48 of series 4). Additional right lower lobe nodules measure 1.8 x 2.7 cm (image 58 of series 4) and 1.5 x 1.1 cm (image 53 of series 4), slightly increased compared to the prior study. Enlarging right upper lobe pulmonary nodule near the apex also noted measuring 1.9 x 1.4 cm (image 19 of series 4). Relatively diffuse nodular thickening of the pleural surfaces in the thorax bilaterally. Small bilateral pleural effusions (left greater than right), layering dependently on the left, and partially loculated in the medial aspect of the posterior right hemithorax. Worsening patchy areas of ground-glass attenuation and septal thickening throughout the lungs bilaterally, favored to reflect drug reaction, although a component of alveolar hemorrhage is also possible given the patient's history of recent  hemoptysis. Diffuse bronchial wall thickening with moderate centrilobular emphysema. Upper Abdomen: Small calcified granuloma in the right lobe of the liver incidentally noted. Musculoskeletal: There are no aggressive appearing lytic or blastic lesions noted in the visualized portions of the skeleton. Review of the MIP images confirms the above findings. IMPRESSION: 1. No evidence of pulmonary embolism. 2. Progression of metastatic disease in the lungs bilaterally, as above, with interval increase in size of numerous right-sided pulmonary nodules, persistent widespread nodular pleural thickening, and increasing right hilar lymphadenopathy. 3. Worsening multifocal airspace disease in the lungs bilaterally, favored to predominantly reflect drug reaction, although a component of alveolar hemorrhage is also possible given the patient's recent history of hemoptysis. 4. Diffuse bronchial wall thickening with moderate centrilobular emphysema; imaging findings suggestive of underlying COPD. 5. Aortic atherosclerosis, in addition to three-vessel coronary artery disease. Please note that although the presence of coronary artery calcium documents the presence of coronary artery disease, the severity of this disease and any potential stenosis cannot be assessed on this non-gated CT examination. Assessment for potential risk factor modification, dietary therapy or pharmacologic therapy may be warranted, if clinically indicated. Electronically Signed   By: Vinnie Langton M.D.   On: 09/05/2016 14:47   Ct Angio Chest Pe W And/or Wo Contrast  Result Date: 08/12/2016 CLINICAL DATA:  60 y/o M; productive cough, shortness of breath, and chest pain.  EXAM: CT ANGIOGRAPHY CHEST WITH CONTRAST TECHNIQUE: Multidetector CT imaging of the chest was performed using the standard protocol during bolus administration of intravenous contrast. Multiplanar CT image reconstructions and MIPs were obtained to evaluate the vascular anatomy.  CONTRAST:  100 cc Isovue 370 COMPARISON:  06/21/2016 CT chest. FINDINGS: Cardiovascular: Normal heart size. Small pericardial effusion. Normal caliber thoracic aorta. Minimal aortic atherosclerosis with arch calcifications. Normal caliber main pulmonary artery. Satisfactory opacification of the pulmonary artery to the segmental level. No pulmonary embolus. Mediastinum/Nodes: Nonspecific right lower and upper peritracheal lymphadenopathy. Lungs/Pleura: Left greater than right perihilar consolidation, left upper lobe consolidation, and diffuse ground-glass opacities is most likely to represent multi focal pneumonia. Additionally, there are multiple discrete pulmonary nodules which are either new or increased in size in comparison with the prior CT of the chest: 1. Right lower lobe, 26 mm, series 7 image 56 2. Right hilum, 31 mm, image 49 3. Right lower lobe, 11 mm, image 50 4. Right lower lobe, 7 mm image 62 5. Right middle lobe, 7 mm image 68 6. Right apex, 16 mm, image 17 4 cm left upper lobe mass is grossly stable. Small bilateral pleural effusions. Background of moderate to severe pulmonary emphysema. Minor interlobular septal thickening within the lung apices. Upper Abdomen: No acute abnormality. Musculoskeletal: No acute osseous abnormality identified. Moderate degenerative changes of the thoracic spine and mild degenerative loss of height of mid thoracic vertebral bodies. Review of the MIP images confirms the above findings. IMPRESSION: 1. Areas of consolidation and ground-glass opacity are suspicious for pneumonia. A component of infiltrate neoplasm and lymphangitic carcinomatosis is also possible. 2. Numerous pulmonary nodules predominantly in the right lung are either new or increased in size from the prior CT of the chest probably representing worsening metastatic disease. Grossly stable left upper lobe mass. 3. Minor interlobular septal thickening is consistent with a component of interstitial edema. 4.  Small bilateral pleural effusions. 5. Background of moderate to severe pulmonary emphysema. 6. No evidence for pulmonary embolus. Electronically Signed   By: Kristine Garbe M.D.   On: 08/12/2016 03:22   Dg Chest Portable 1 View  Result Date: 09/05/2016 CLINICAL DATA:  Chronic shortness of breath with cough, congestion and weakness. Intermittent hemoptysis for 2 or 3 weeks. History of lung cancer and pneumonia. EXAM: PORTABLE CHEST 1 VIEW COMPARISON:  Radiographs 08/11/2016 and 02/04/2016.  CT 08/12/2016) FINDINGS: 1128 hours. Right IJ Port-A-Cath tip appears unchanged near the SVC right atrial junction. The heart size and mediastinal contours are stable with mild superior mediastinal widening and bilateral hilar distortion. There is grossly stable lung disease with architectural distortion, subpleural reticulation and honeycomb formation compared with recent studies. Previously demonstrated bilateral pulmonary nodularity is grossly stable, not well seen radiographically. No superimposed airspace disease or significant pleural effusion identified. The bones appear unchanged. IMPRESSION: Radiographically stable appearance of the chest compared with recent studies. There is underlying chronic lung disease with superimposed interstitial opacities which may be treatment related or inflammatory. Electronically Signed   By: Richardean Sale M.D.   On: 09/05/2016 11:48    Subjective: Pt terminal but appears comfortable.    Discharge Exam: Vitals:   09/07/16 0519 09/07/16 0535  BP: 108/64   Pulse: (!) 114 (!) 107  Resp: 20   Temp:     Vitals:   09/07/16 0519 09/07/16 0535 09/07/16 0731 09/07/16 0734  BP: 108/64     Pulse: (!) 114 (!) 107    Resp: 20     Temp:  TempSrc:      SpO2: 99%  100% 100%  Weight:      Height:        General: Pt appears terminal in no distress, appears comfortable  The results of significant diagnostics from this hospitalization (including imaging,  microbiology, ancillary and laboratory) are listed below for reference.     Microbiology: Recent Results (from the past 240 hour(s))  Blood culture (routine x 2)     Status: None (Preliminary result)   Collection Time: 09/05/16 12:16 PM  Result Value Ref Range Status   Specimen Description BLOOD LEFT ANTECUBITAL  Final   Special Requests BOTTLES DRAWN AEROBIC AND ANAEROBIC 5CC  Final   Culture   Final    NO GROWTH 1 DAY Performed at [facility]    Report Status PENDING  Incomplete  Blood culture (routine x 2)     Status: None (Preliminary result)   Collection Time: 09/05/16 12:19 PM  Result Value Ref Range Status   Specimen Description BLOOD PORTA CATH  Final   Special Requests BOTTLES DRAWN AEROBIC AND ANAEROBIC 5CC  Final   Culture   Final    NO GROWTH 1 DAY Performed at [facility]    Report Status PENDING  Incomplete     Labs: BNP (last 3 results)  Recent Labs  01/18/16 1634 01/31/16 2242 08/12/16 0057  BNP 24.5 69.8 57.0   Basic Metabolic Panel:  Recent Labs Lab 09/05/16 1219 09/06/16 0520  NA 127* 130*  K 4.4 4.4  CL 94* 96*  CO2 25 25  GLUCOSE 111* 170*  BUN 8 9  CREATININE 0.47* 0.54*  CALCIUM 8.9 8.9   Liver Function Tests:  Recent Labs Lab 09/05/16 1219 09/06/16 0520  AST 22 23  ALT 13* 12*  ALKPHOS 62 58  BILITOT 0.5 0.6  PROT 6.6 6.3*  ALBUMIN 3.2* 3.0*   No results for input(s): LIPASE, AMYLASE in the last 168 hours. No results for input(s): AMMONIA in the last 168 hours. CBC:  Recent Labs Lab 09/05/16 1219 09/05/16 2156 09/06/16 0520  WBC 7.9 8.8 3.4*  NEUTROABS  --  8.5*  --   HGB 8.4* 8.6* 7.9*  HCT 23.6* 24.7* 22.8*  MCV 85.8 86.7 87.0  PLT 67* 68* 68*   Cardiac Enzymes:  Recent Labs Lab 09/05/16 1219  TROPONINI <0.03   BNP: Invalid input(s): POCBNP CBG: No results for input(s): GLUCAP in the last 168 hours. D-Dimer No results for input(s): DDIMER in the last 72 hours. Hgb A1c No results  for input(s): HGBA1C in the last 72 hours. Lipid Profile No results for input(s): CHOL, HDL, LDLCALC, TRIG, CHOLHDL, LDLDIRECT in the last 72 hours. Thyroid function studies No results for input(s): TSH, T4TOTAL, T3FREE, THYROIDAB in the last 72 hours.  Invalid input(s): FREET3 Anemia work up No results for input(s): VITAMINB12, FOLATE, FERRITIN, TIBC, IRON, RETICCTPCT in the last 72 hours. Urinalysis    Component Value Date/Time   COLORURINE YELLOW 02/01/2016 0113   APPEARANCEUR CLEAR 02/01/2016 0113   LABSPEC 1.013 02/01/2016 0113   PHURINE 7.0 02/01/2016 0113   GLUCOSEU 100 (A) 02/01/2016 0113   HGBUR NEGATIVE 02/01/2016 0113   BILIRUBINUR NEGATIVE 02/01/2016 0113   KETONESUR 15 (A) 02/01/2016 0113   PROTEINUR NEGATIVE 02/01/2016 0113   UROBILINOGEN 0.2 08/15/2014 1654   NITRITE NEGATIVE 02/01/2016 0113   LEUKOCYTESUR NEGATIVE 02/01/2016 0113   Sepsis Labs Invalid input(s): PROCALCITONIN,  WBC,  LACTICIDVEN Microbiology Recent Results (from the past 240 hour(s))  Blood culture (routine x 2)     Status: None (Preliminary result)   Collection Time: 09/05/16 12:16 PM  Result Value Ref Range Status   Specimen Description BLOOD LEFT ANTECUBITAL  Final   Special Requests BOTTLES DRAWN AEROBIC AND ANAEROBIC 5CC  Final   Culture   Final    NO GROWTH 1 DAY Performed at [facility]    Report Status PENDING  Incomplete  Blood culture (routine x 2)     Status: None (Preliminary result)   Collection Time: 09/05/16 12:19 PM  Result Value Ref Range Status   Specimen Description BLOOD PORTA CATH  Final   Special Requests BOTTLES DRAWN AEROBIC AND ANAEROBIC 5CC  Final   Culture   Final    NO GROWTH 1 DAY Performed at [facility]    Report Status PENDING  Incomplete   Time coordinating discharge: 32 minutes  SIGNED:  Irwin Brakeman, MD  [facility] 09/07/2016, 11:45 AM Pager   If 7PM-7AM, please contact night-coverage [email or web address] Password  [email or web address]

## 2016-09-07 NOTE — Progress Notes (Addendum)
Daily Progress Note   Patient Name: Chad Wang       Date: 09/07/2016 DOB: [birth year]  Age: 60 y.o. MRN#: [id number] Attending Physician: Murlean Iba, MD Primary Care Physician: [name], MD Admit Date: 09/05/2016  Reason for Consultation/Follow-up: Disposition, Establishing goals of care, Hospice Evaluation and Non pain symptom management  Subjective: Patient resting comfortably in bed. Required one dose of SL morphine yesterday for SOB, provided relief. Met with sister's Shirlean Mylar and Maudie Mercury- both agree residential hospice is preferred placement. GOC defined as comfort measures only.   Review of Systems  Unable to perform ROS: Other  Patient resting comfortably- family requested not to wake him  Length of Stay: 2  Current Medications: Scheduled Meds:  . cloZAPine  100 mg Oral QHS  . divalproex  500 mg Oral Daily   And  . divalproex  1,000 mg Oral QHS  . docusate sodium  100 mg Oral BID  . feeding supplement (ENSURE ENLIVE)  237 mL Oral BID BM  . ipratropium-albuterol  3 mL Nebulization Q6H  . methylPREDNISolone (SOLU-MEDROL) injection  40 mg Intravenous Q6H  . mometasone-formoterol  2 puff Inhalation BID  . sodium chloride flush  10-40 mL Intracatheter Q12H  . sodium chloride flush  3 mL Intravenous Q12H    Continuous Infusions:   PRN Meds: acetaminophen **OR** acetaminophen, albuterol, antiseptic oral rinse, diphenhydrAMINE, glycopyrrolate **OR** glycopyrrolate **OR** glycopyrrolate, haloperidol **OR** haloperidol **OR** haloperidol lactate, LORazepam **OR** LORazepam **OR** LORazepam, morphine injection, morphine CONCENTRATE, ondansetron **OR** ondansetron (ZOFRAN) IV, polyvinyl alcohol, sodium chloride flush, traMADol, traZODone  Physical Exam  Constitutional:  No distress.  Appears comfortable  Cardiovascular: Normal rate and regular rhythm.   Pulmonary/Chest: Effort normal. He has wheezes.  Neurological:  sleeping            Vital Signs: BP 108/64 (BP Location: Left Arm)   Pulse (!) 107   Temp 97.8 F (36.6 C) (Oral)   Resp 20   Ht 5' 10"  (1.778 m)   Wt 67.2 kg (148 lb 3.2 oz)   SpO2 100%   BMI 21.26 kg/m  SpO2: SpO2: 100 % O2 Device: O2 Device: NRB O2 Flow Rate: O2 Flow Rate (L/min): 15 L/min  Intake/output summary:  Intake/Output Summary (Last 24 hours) at 09/07/16 1033 Last data filed at 09/07/16 0517  Gross per 24  hour  Intake              240 ml  Output              725 ml  Net             -485 ml   LBM: Last BM Date: 09/04/16 Baseline Weight: Weight: 67.2 kg (148 lb 3.2 oz) Most recent weight: Weight: 67.2 kg (148 lb 3.2 oz)       Palliative Assessment/Data: PPS: 10%    Flowsheet Rows   Flowsheet Row Most Recent Value  Intake Tab  Referral Department  Hospitalist  Unit at Time of Referral  Cardiac/Telemetry Unit  Palliative Care Primary Diagnosis  Cancer  Date Notified  09/06/16  Palliative Care Type  New Palliative care  Reason for referral  End of Life Care Assistance, Counsel Regarding Hospice  Date of Admission  09/05/16  Date first seen by Palliative Care  09/06/16  # of days Palliative referral response time  0 Day(s)  # of days IP prior to Palliative referral  1  Clinical Assessment  Palliative Performance Scale Score  30%  Psychosocial & Spiritual Assessment  Social Work Plan of Care  Clarified patient/family wishes with healthcare team, Advance care planning, Education on Hospice  Palliative Care Outcomes  Palliative Care Outcomes  Improved non-pain symptom therapy, Provided end of life care assistance, Clarified goals of care, Provided advance care planning, Transitioned to hospice, Changed to focus on comfort  Patient/Family wishes: Interventions discontinued/not started   Mechanical Ventilation,  BiPAP, Hemodialysis, Transfusion, Vasopressors, PEG, Transfer out of ICU, Trach, NIPPV, Tube feedings/TPN, Antibiotics  Palliative Care follow-up planned  Yes, Facility      Patient Active Problem List   Diagnosis Date Noted  . Hemoptysis   . Primary malignant neoplasm of lung metastatic to other site [facility])   . SOB (shortness of breath)   . Goals of care, counseling/discussion   . Palliative care by specialist   . Shortness of breath   . 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 Acute on chronic respiratory failure with hypoxia ([place])   . Difficulty in walking, not elsewhere classified   . Malignant neoplasm of lung ([place])   . Chronic respiratory failure with hypoxia ([place]) 08/12/2016  . Anemia in other chronic diseases classified elsewhere 08/12/2016  . Thrombocytopenia ([place]) 08/12/2016  . Elevated troponin 08/12/2016  . HCAP (healthcare-associated pneumonia) 08/12/2016  . Palliative care encounter   . DNR (do not resuscitate) discussion   . 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 COPD (chronic obstructive pulmonary disease) ([place]) 02/07/2016  . Schizophrenia ([place]) 02/07/2016  . Pressure ulcer 02/01/2016  . Acute respiratory failure with hypoxemia ([place])   . Back pain   . Laryngeal mass   . Malnutrition of moderate degree 01/19/2016  . Primary cancer of left upper lobe of lung ([place]) 01/19/2016  . Essential hypertension 01/19/2016  . Pulmonary emphysema ([place])   . Community acquired pneumonia 01/18/2016  . Hyponatremia 01/18/2016  . DM type 2 (diabetes mellitus, type 2) ([place]) 01/18/2016  . Tobacco abuse 01/18/2016    Palliative Care Assessment & Plan   Patient Profile: 60 y.o. 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 CT scan revealed worsening  lung cancer with likely  alveolar hemorrhage.  Oncology recommended Hospice. Palliative medicine consulted for [place], and assistance with transition to Hospice.     Assessment/Recommendations/Plan -Lung ca- aggressive ca progressing despite all treatments, now with alveolar hemorrhage causing significant SOB, CBC shows Hgb trending down- no further treatment, no blood transfusion planned -Terminal Care- Family requesting residential Hospice placement with [place] being comfort only -Comfort orders:   --SOB:   -Morphine intensol (53m/0.5mL) 5468mSL q1hr prn moderate  SOB and as premed for any exertional activity  -Morphine 68m16mV q1hr prn severe SOB  -Fan blowing directly in face  -Keep room cool --Anxiety/Agitation:  -lorazepam 1mg71m or IV q 4 hours prn  -haloperidol .5mg 40m IV, SL q 4hrs prn --Pulmonary edema/Audible Terminal Secretions:  - Robinul 1 mg po or .68mg I45mr SL q 4 hrs prn --Stop IV fluids --Stop all meds that do not focus on comfort --Consult to SW for residential hospice placement   Goals of Care and Additional Recommendations:  Limitations on Scope of Treatment: Full Comfort Care, Minimize Medications, Initiate Comfort Feeding, No Artificial Feeding, No Blood Transfusions, No Chemotherapy, No Diagnostics, No Glucose Monitoring, No Hemodialysis, No IV Antibiotics, No IV Fluids, No Lab Draws, No Radiation, No Surgical Procedures and No Tracheostomy  Code Status:  DNR  Prognosis:   Hours - Days based on risk for sudden death d/t hemorrhage, Hgb rapidly declining, rapidly declining respiratory status, transition to comfort measures only  Discharge Planning:  Hospice facility evaluation requested  Care plan was discussed with HCPOA- Kim Maudie Mercuryister, Robin.Shirlean Mylarnk you for allowing the Palliative Medicine Team to assist in the care of this patient.   Time In: 1000 Time Out: 1030 Total Time 30 mins Prolonged Time Billed No      Greater than 50%  of this time was spent  counseling and coordinating care related to the above assessment and plan.  Dewayne Severe Mariana Kaufman-C Palliative Medicine   Please contact Palliative Medicine Team phone at [phone] and concerns.

## 2016-09-10 LAB — CULTURE, BLOOD (ROUTINE X 2)
CULTURE: NO GROWTH
Culture: NO GROWTH

## 2016-09-12 ENCOUNTER — Telehealth: Payer: Self-pay | Admitting: *Deleted

## 2016-09-12 NOTE — Telephone Encounter (Addendum)
Call received from [facility]  called stating that they faxed over forms for pt to receive a [id number] However, they haven't received them back as of yet she ask if Dr. Hazeline Junker RN could please give her a call back and let her know if forms were received @ [phone] call was transferred to vmail message left.

## 2016-09-13 ENCOUNTER — Other Ambulatory Visit: Payer: Self-pay

## 2016-09-13 ENCOUNTER — Ambulatory Visit: Payer: Self-pay | Admitting: Oncology

## 2016-09-13 ENCOUNTER — Encounter: Payer: Self-pay | Admitting: Nutrition

## 2016-09-13 ENCOUNTER — Ambulatory Visit: Payer: Self-pay

## 2016-09-26 ENCOUNTER — Encounter: Payer: Self-pay | Admitting: *Deleted

## 2016-09-26 NOTE — Progress Notes (Signed)
Received a faxed message from [facility] that patient passed away on 09-29-2016 at 4:34 pm. POF sent to scheduling to cancel all appointments.

## 2016-09-28 ENCOUNTER — Telehealth: Payer: Self-pay | Admitting: Oncology

## 2016-09-28 NOTE — Telephone Encounter (Signed)
[place] the death certificate is ready for pick up. Faxed over the certificate per [employer]'s request.

## 2016-09-28 DEATH — deceased

## 2016-10-19 ENCOUNTER — Other Ambulatory Visit: Payer: Self-pay | Admitting: Nurse Practitioner

## 2017-02-13 IMAGING — CT CT ANGIO CHEST
2 of 6 series · 16 of 36 positions shown · IV contrast (ISOVUE 370)
Comparison: Chest CT 08/12/2016.

CLINICAL DATA: 60-year-old male with history of chronic shortness
of breath, cough and congestion presenting with weakness and
intermittent hemoptysis 2-3 times per week. Chest pain while
coughing. History of lung cancer, with last chemotherapy 2 weeks
ago.

EXAM:
CT ANGIOGRAPHY CHEST WITH CONTRAST
TECHNIQUE: Multidetector CT imaging of the chest was performed using the
standard protocol during bolus administration of intravenous
contrast. Multiplanar CT image reconstructions and MIPs were
obtained to evaluate the vascular anatomy.
CONTRAST:  100 mL of Isovue 370.

[Series 5: coronal mpr · coronal · 0.54mm/px · 1 of 148 slices shown]
[im 74/148  mediastinal]
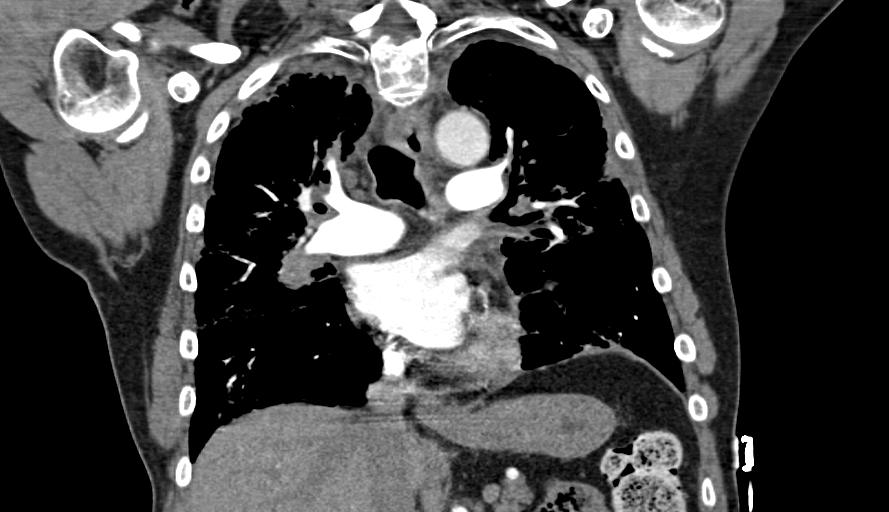

[Series 10: thins for pacs · axial · 0.77mm/px · z∈[+1281,+1528]mm · 15 of 275 slices shown]
[im 14/275  lung]
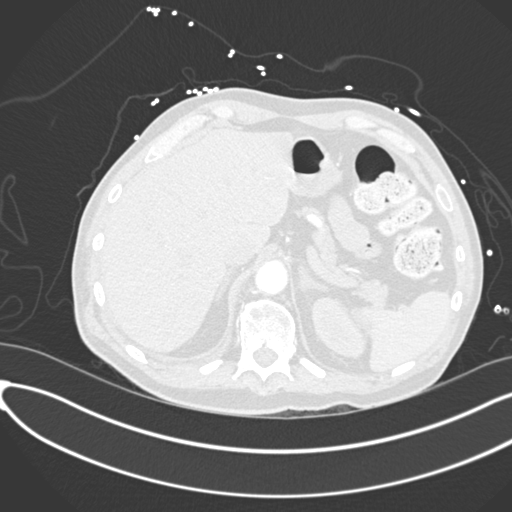
[im 28/275  mediastinal]
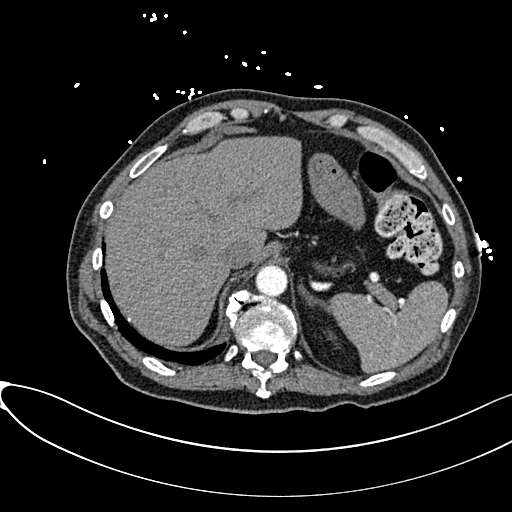
[im 55/275  lung]
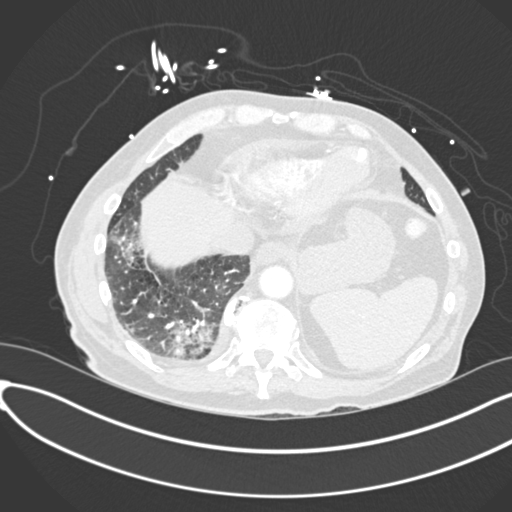
[im 69/275  mediastinal]
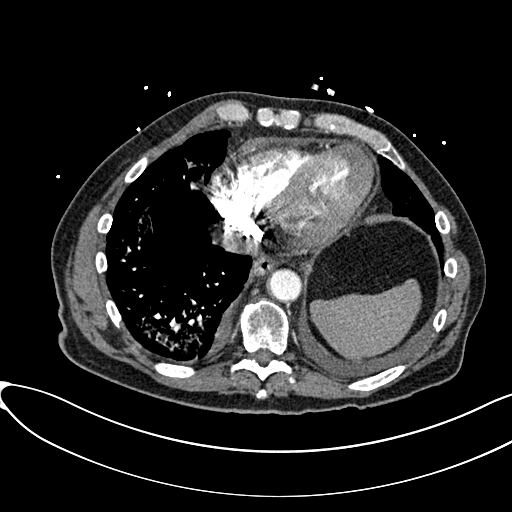
[im 83/275  lung]
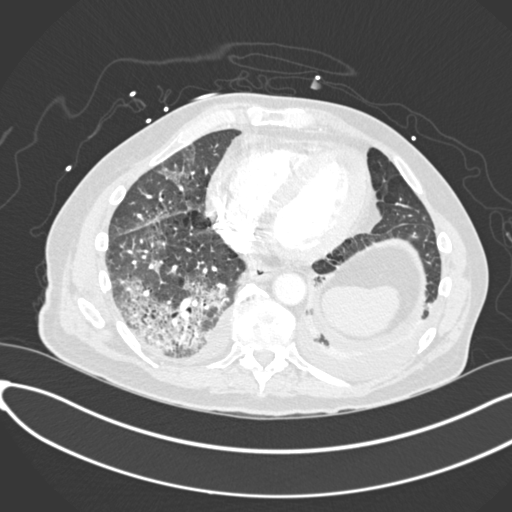
[im 96/275  mediastinal]
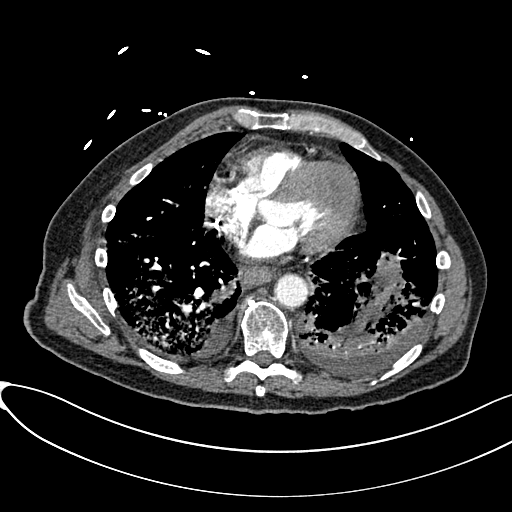
[im 124/275  lung]
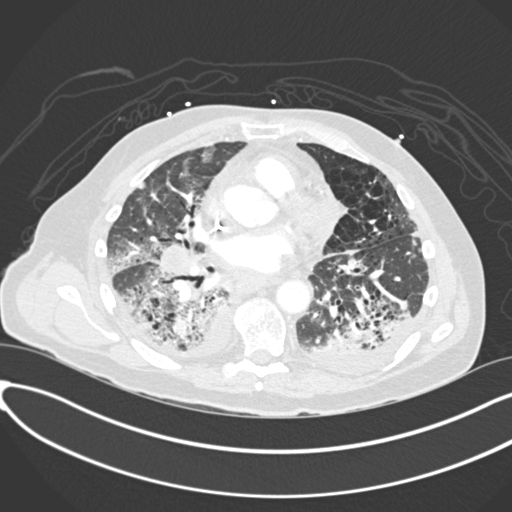
[im 138/275  mediastinal]
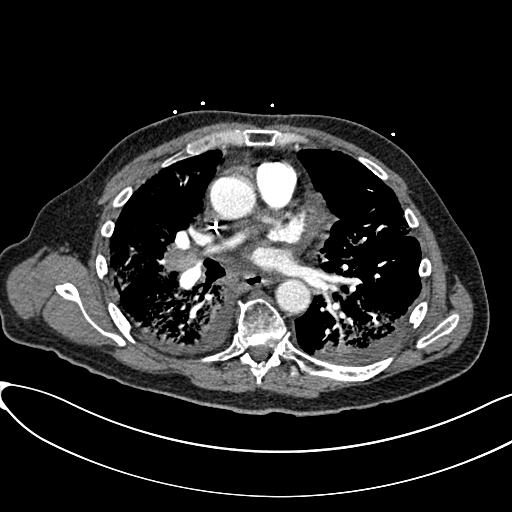
[im 151/275  lung]
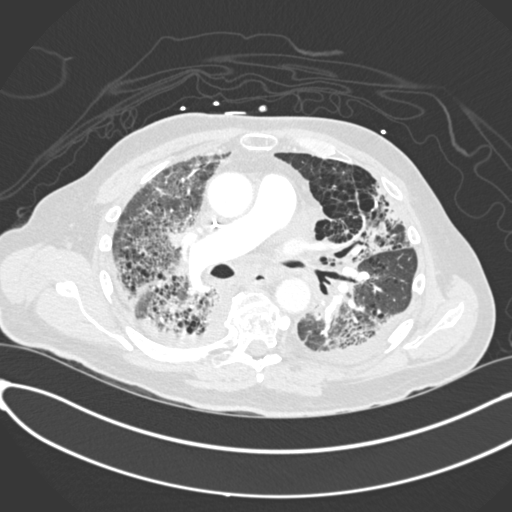
[im 179/275  mediastinal]
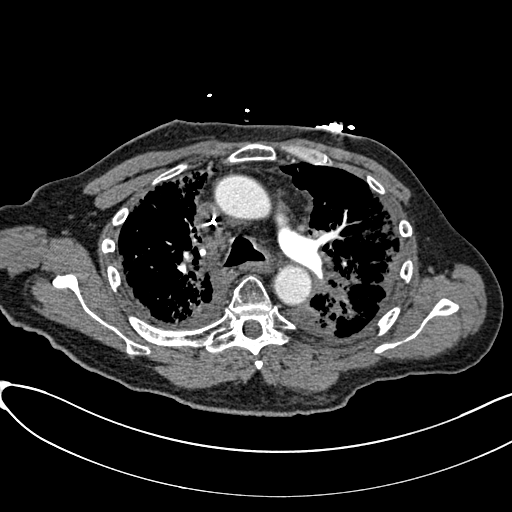
[im 192/275  lung]
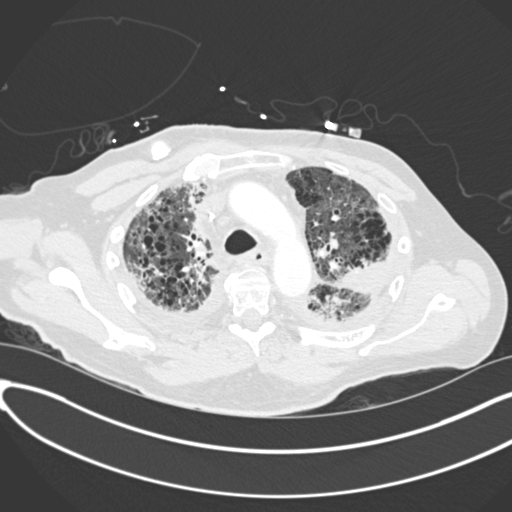
[im 206/275  mediastinal]
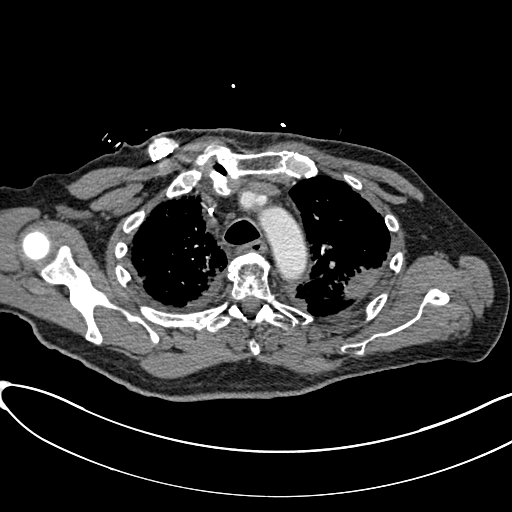
[im 220/275  lung]
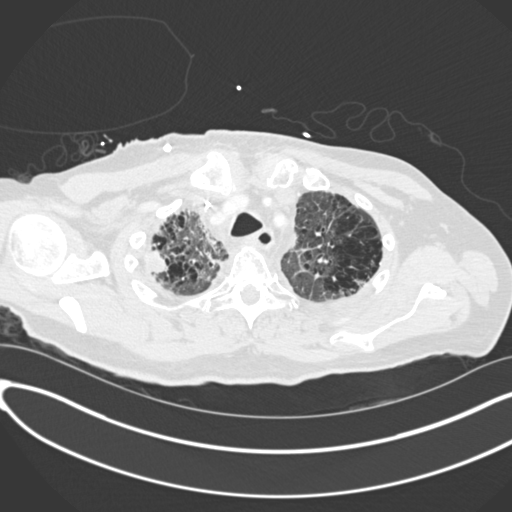
[im 247/275  mediastinal]
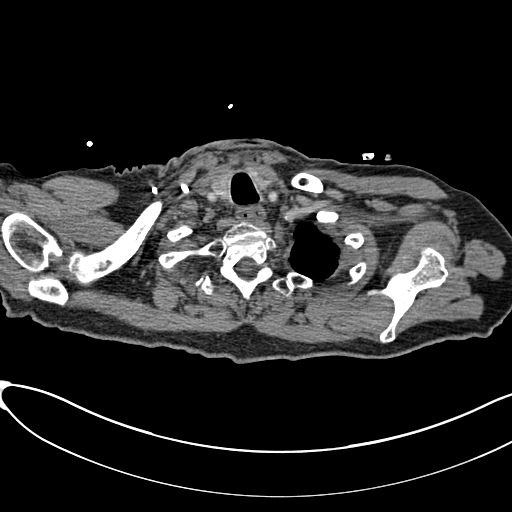
[im 261/275  lung]
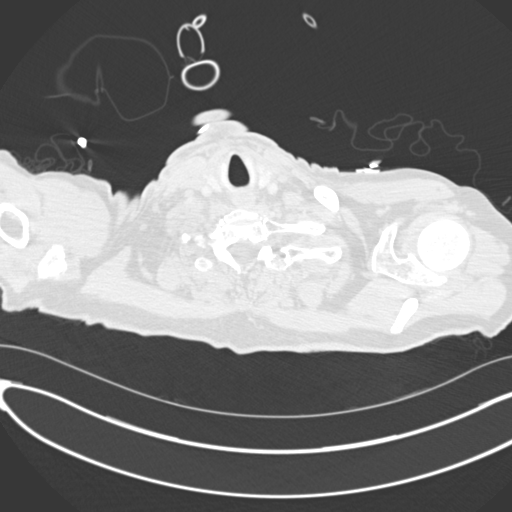

[16 of 36 positions shown; findings below may reference images not displayed]

FINDINGS: Cardiovascular: There are no filling defects within the pulmonary
atoll tree to suggest underlying pulmonary embolism. Heart size is
normal. There is no significant pericardial fluid, thickening or
pericardial calcification. There is aortic atherosclerosis, as well
as atherosclerosis of the great vessels of the mediastinum and the
coronary arteries, including calcified atherosclerotic plaque in the
left anterior descending and left circumflex coronary arteries.
Right internal jugular single-lumen porta cath with tip terminating
in the right atrium.

Mediastinum/Nodes: Multiple borderline enlarged and mildly enlarged
mediastinal and hilar lymph nodes are noted, measuring up to 1.2 cm
in the right hilum. Esophagus is unremarkable in appearance. No
axillary lymphadenopathy.

Lungs/Pleura: Treated left upper lobe mass is again noted, measuring
4.3 x 2.6 cm on today's study (image 27 of series 11), very similar
to the prior examination. Multiple additional pulmonary nodules and
masses are again noted, largest of which is in the central aspect of
the right lung abutting the hilum involving either the posterior
aspect of the right middle lobe and/or the anterior aspect of the
right lower lobe (in close association with the major fissure),
measuring 3.1 x 2.6 cm (image 48 of series 4). Additional right
lower lobe nodules measure 1.8 x 2.7 cm (image 58 of series 4) and
1.5 x 1.1 cm (image 53 of series 4), slightly increased compared to
the prior study. Enlarging right upper lobe pulmonary nodule near
the apex also noted measuring 1.9 x 1.4 cm (image 19 of series 4).
Relatively diffuse nodular thickening of the pleural surfaces in the
thorax bilaterally. Small bilateral pleural effusions (left greater
than right), layering dependently on the left, and partially
loculated in the medial aspect of the posterior right hemithorax.
Worsening patchy areas of ground-glass attenuation and septal
thickening throughout the lungs bilaterally, favored to reflect drug
reaction, although a component of alveolar hemorrhage is also
possible given the patient's history of recent hemoptysis. Diffuse
bronchial wall thickening with moderate centrilobular emphysema.

Upper Abdomen: Small calcified granuloma in the right lobe of the
liver incidentally noted.

Musculoskeletal: There are no aggressive appearing lytic or blastic
lesions noted in the visualized portions of the skeleton.

Review of the MIP images confirms the above findings.
IMPRESSION: 1. No evidence of pulmonary embolism.
2. Progression of metastatic disease in the lungs bilaterally, as
above, with interval increase in size of numerous right-sided
pulmonary nodules, persistent widespread nodular pleural thickening,
and increasing right hilar lymphadenopathy.
3. Worsening multifocal airspace disease in the lungs bilaterally,
favored to predominantly reflect drug reaction, although a component
of alveolar hemorrhage is also possible given the patient's recent
history of hemoptysis.
4. Diffuse bronchial wall thickening with moderate centrilobular
emphysema; imaging findings suggestive of underlying COPD.
5. Aortic atherosclerosis, in addition to three-vessel coronary
artery disease. Please note that although the presence of coronary
artery calcium documents the presence of coronary artery disease,
the severity of this disease and any potential stenosis cannot be
assessed on this non-gated CT examination. Assessment for potential
risk factor modification, dietary therapy or pharmacologic therapy
may be warranted, if clinically indicated.

## 2017-02-14 IMAGING — DX DG CHEST 2V
2 series · 2 of 2 positions shown · non-contrast
Comparison: 09/05/2016

CLINICAL DATA: Shortness of breath, lung cancer

EXAM:
CHEST  2 VIEW

[chest ap w/grid]
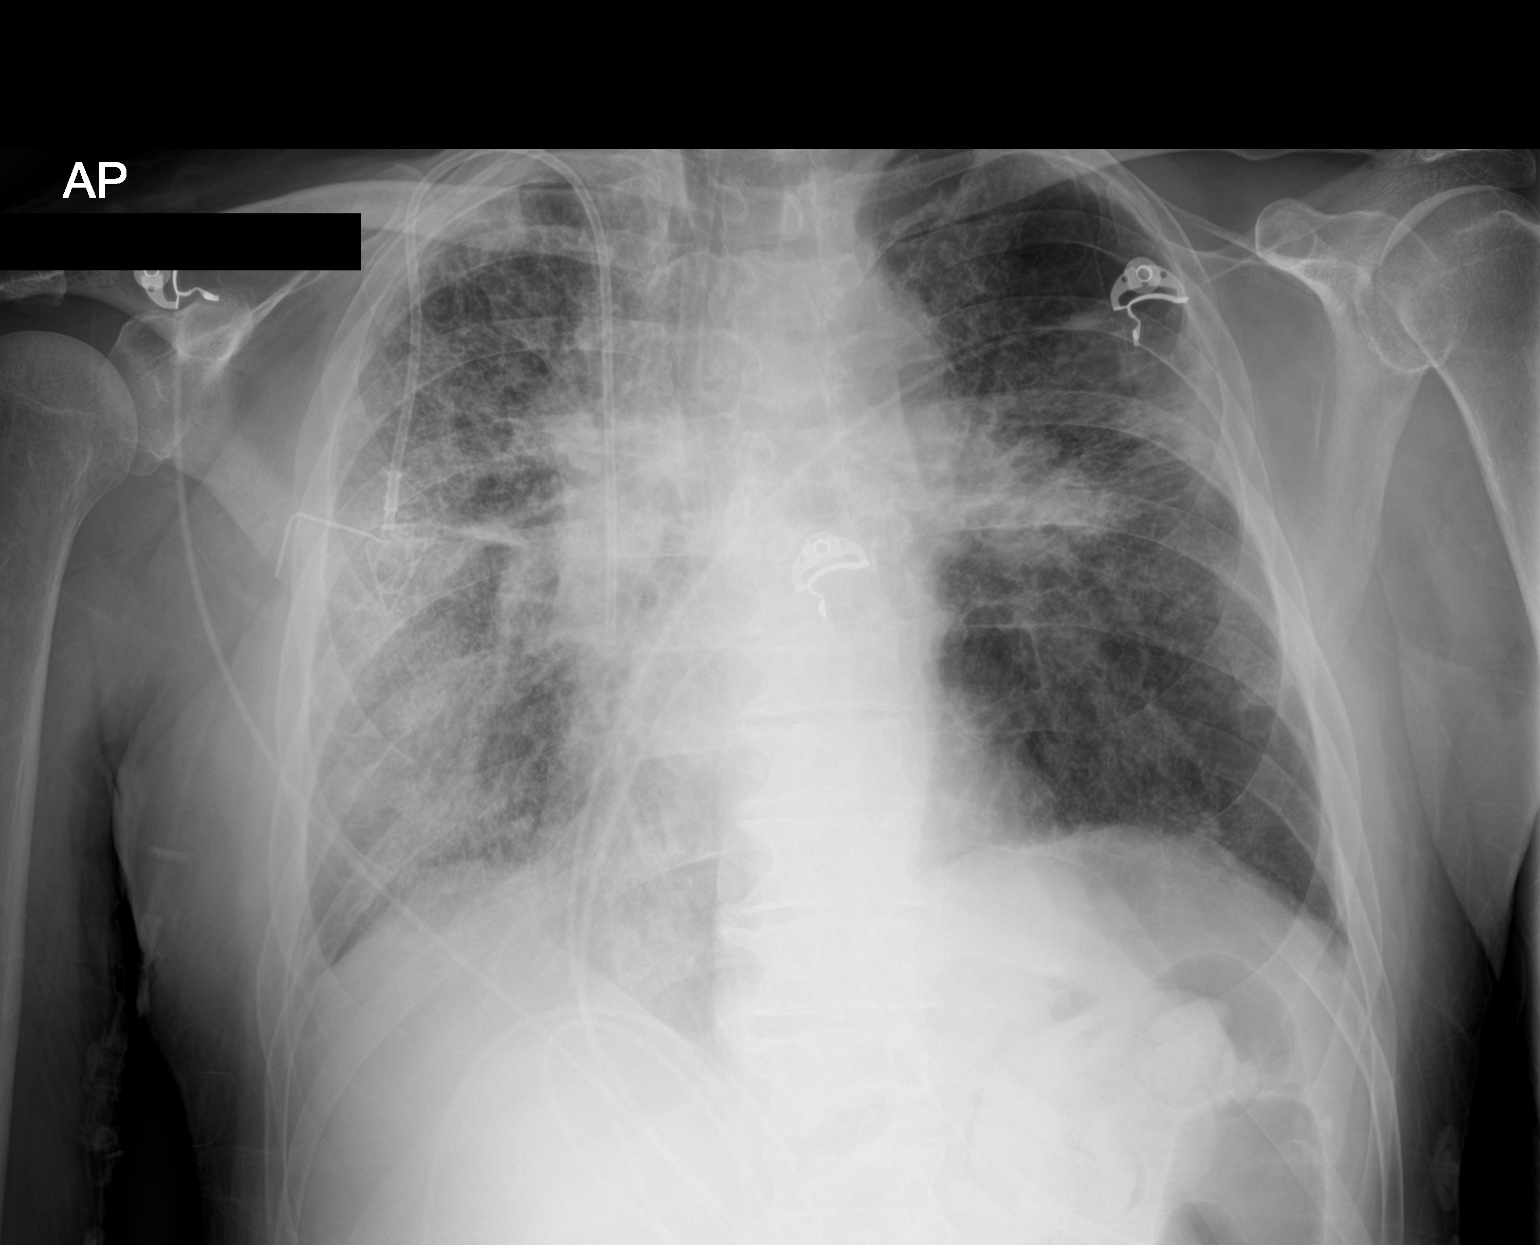

[chest lat]
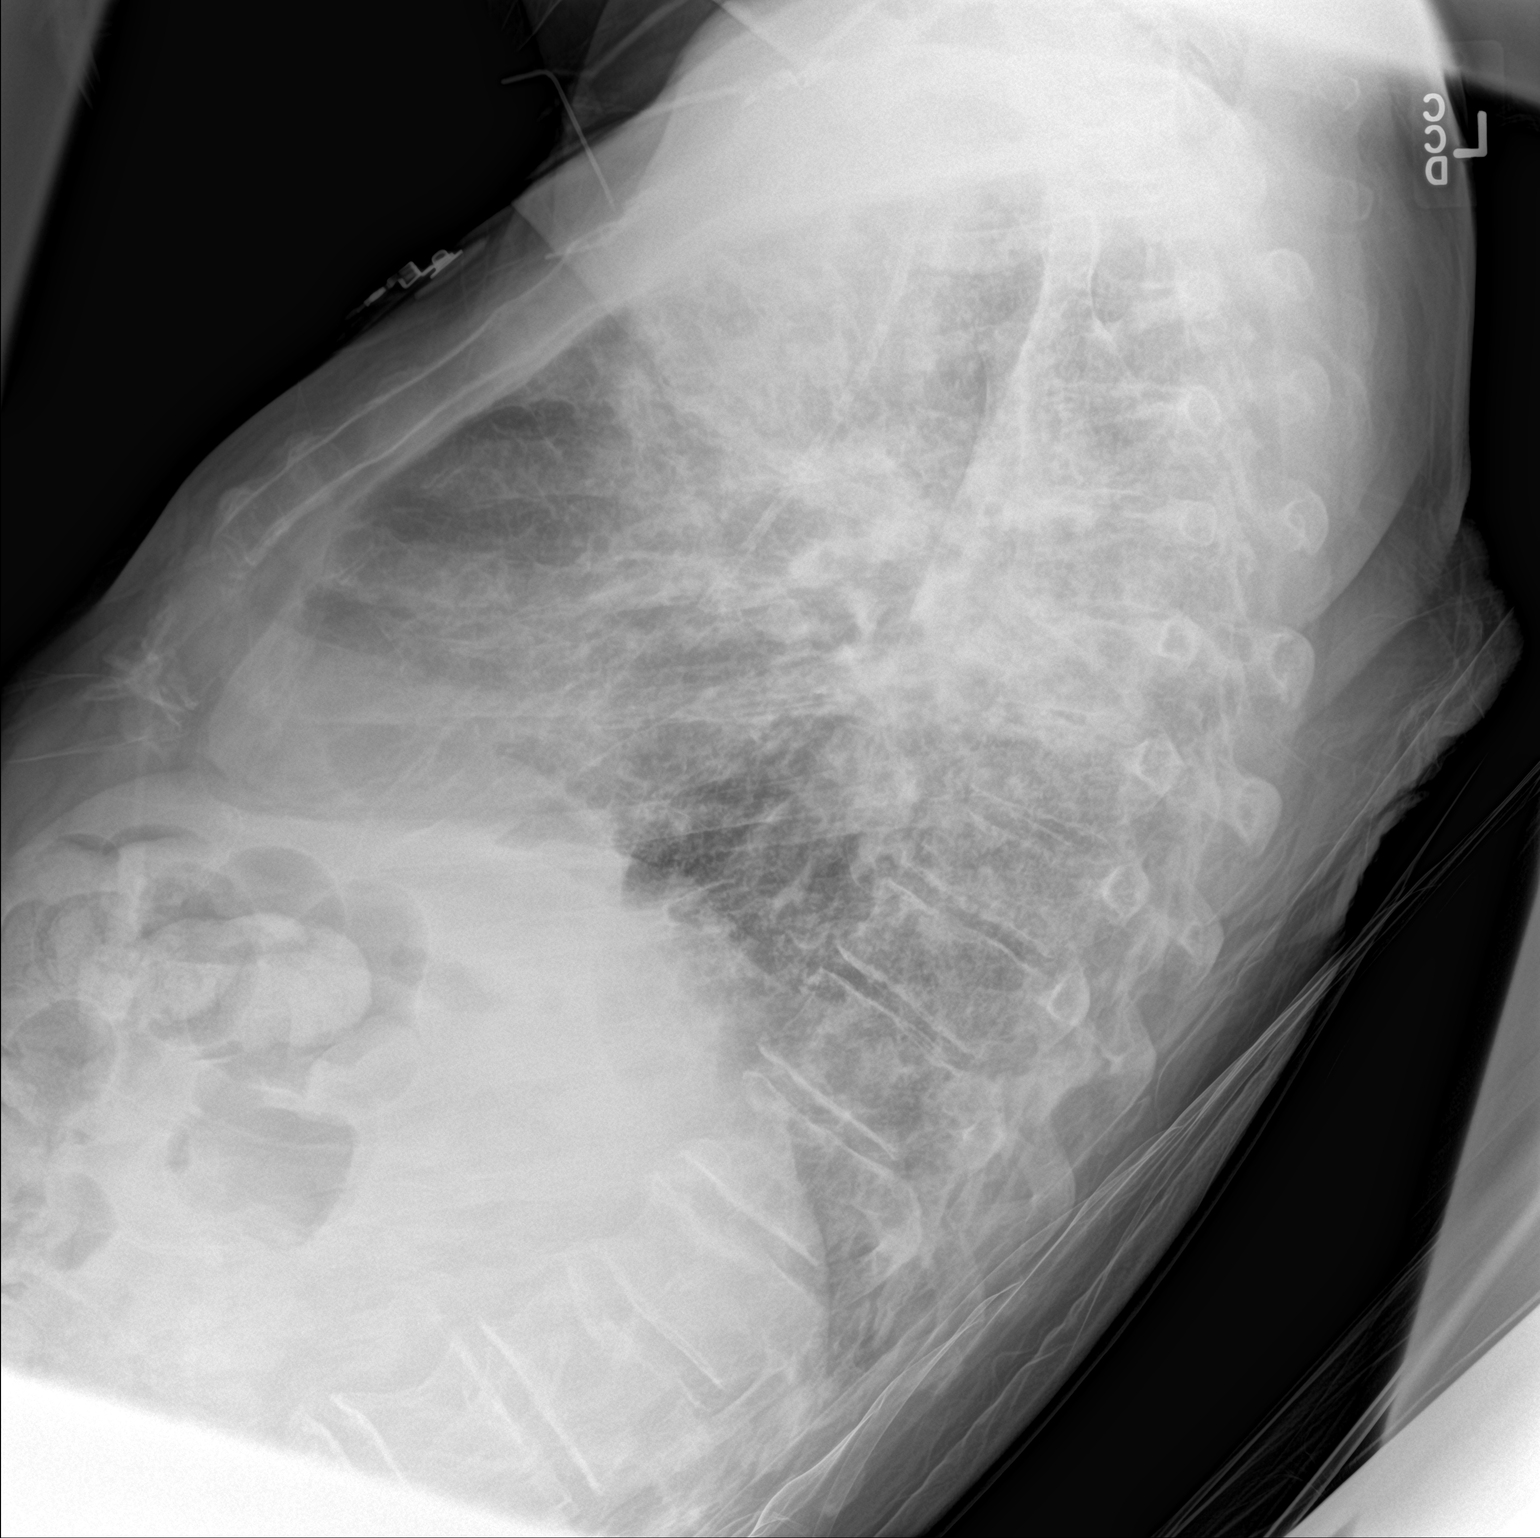

[2 of 2 positions shown; findings below may reference images not displayed]

FINDINGS: Right sided Port-A-Cath with the tip projecting over the SVC.

Cardiomediastinal silhouette is stable.

Stable bilateral interstitial and alveolar airspace opacities. No
significant interval change accounting for the differences in
technique. Stable lung disease with architectural distortion. Left
perihilar focal airspace opacity is stable extending to the pleural
surface. No new area of airspace disease. Small bilateral pleural
effusions. No pneumothorax. No acute osseous abnormality.
IMPRESSION: 1. Stable bilateral interstitial and alveolar airspace opacities
without significant interval change compared with the prior exam.
2. Stable metastatic lung disease.
